# Patient Record
Sex: Male | Born: 1980
Health system: Southern US, Community
[De-identification: ages and names within clinical notes are randomized; demographics above are authoritative.]

## PROBLEM LIST (undated history)

## (undated) DIAGNOSIS — R002 Palpitations: Secondary | ICD-10-CM

## (undated) DIAGNOSIS — I10 Essential (primary) hypertension: Secondary | ICD-10-CM

## (undated) DIAGNOSIS — E7800 Pure hypercholesterolemia, unspecified: Secondary | ICD-10-CM

## (undated) DIAGNOSIS — E079 Disorder of thyroid, unspecified: Secondary | ICD-10-CM

## (undated) DIAGNOSIS — M1990 Unspecified osteoarthritis, unspecified site: Secondary | ICD-10-CM

## (undated) DIAGNOSIS — E119 Type 2 diabetes mellitus without complications: Secondary | ICD-10-CM

## (undated) DIAGNOSIS — E669 Obesity, unspecified: Secondary | ICD-10-CM

## (undated) HISTORY — DX: Type 2 diabetes mellitus without complications: E11.9

## (undated) HISTORY — PX: TONSILLECTOMY AND ADENOIDECTOMY: SHX28

## (undated) HISTORY — DX: Unspecified osteoarthritis, unspecified site: M19.90

## (undated) HISTORY — DX: Palpitations: R00.2

## (undated) HISTORY — PX: ELEVATION OF DEPRESSED SKULL FRACTURE: SUR431

---

## 2003-11-17 ENCOUNTER — Emergency Department (HOSPITAL_COMMUNITY): Admission: EM | Admit: 2003-11-17 | Discharge: 2003-11-17 | Payer: Self-pay | Admitting: *Deleted

## 2004-08-26 ENCOUNTER — Emergency Department (HOSPITAL_COMMUNITY): Admission: EM | Admit: 2004-08-26 | Discharge: 2004-08-26 | Payer: Self-pay | Admitting: Family Medicine

## 2005-01-25 ENCOUNTER — Emergency Department (HOSPITAL_COMMUNITY): Admission: EM | Admit: 2005-01-25 | Discharge: 2005-01-25 | Payer: Self-pay | Admitting: Emergency Medicine

## 2005-05-24 ENCOUNTER — Emergency Department (HOSPITAL_COMMUNITY): Admission: EM | Admit: 2005-05-24 | Discharge: 2005-05-24 | Payer: Self-pay | Admitting: Family Medicine

## 2005-07-12 ENCOUNTER — Emergency Department (HOSPITAL_COMMUNITY): Admission: EM | Admit: 2005-07-12 | Discharge: 2005-07-12 | Payer: Self-pay | Admitting: Emergency Medicine

## 2005-08-06 ENCOUNTER — Emergency Department (HOSPITAL_COMMUNITY): Admission: EM | Admit: 2005-08-06 | Discharge: 2005-08-06 | Payer: Self-pay | Admitting: Emergency Medicine

## 2005-08-16 ENCOUNTER — Emergency Department (HOSPITAL_COMMUNITY): Admission: EM | Admit: 2005-08-16 | Discharge: 2005-08-16 | Payer: Self-pay | Admitting: Emergency Medicine

## 2005-10-10 ENCOUNTER — Emergency Department (HOSPITAL_COMMUNITY): Admission: EM | Admit: 2005-10-10 | Discharge: 2005-10-10 | Payer: Self-pay | Admitting: Emergency Medicine

## 2006-08-01 ENCOUNTER — Emergency Department (HOSPITAL_COMMUNITY): Admission: EM | Admit: 2006-08-01 | Discharge: 2006-08-01 | Payer: Self-pay | Admitting: Family Medicine

## 2006-10-27 ENCOUNTER — Emergency Department (HOSPITAL_COMMUNITY): Admission: EM | Admit: 2006-10-27 | Discharge: 2006-10-27 | Payer: Self-pay | Admitting: Emergency Medicine

## 2007-01-05 ENCOUNTER — Ambulatory Visit: Payer: Self-pay | Admitting: Family Medicine

## 2007-01-05 LAB — CONVERTED CEMR LAB
ALT: 81 units/L — ABNORMAL HIGH (ref 0–53)
AST: 28 units/L (ref 0–37)
Albumin: 4.5 g/dL (ref 3.5–5.2)
Alkaline Phosphatase: 78 units/L (ref 39–117)
BUN: 12 mg/dL (ref 6–23)
Basophils Absolute: 0 10*3/uL (ref 0.0–0.1)
Basophils Relative: 1 % (ref 0–1)
CO2: 24 meq/L (ref 19–32)
Calcium: 10 mg/dL (ref 8.4–10.5)
Chloride: 102 meq/L (ref 96–112)
Cholesterol: 185 mg/dL (ref 0–200)
Creatinine, Ser: 1.08 mg/dL (ref 0.40–1.50)
Eosinophils Absolute: 0.2 10*3/uL (ref 0.0–0.7)
Eosinophils Relative: 3 % (ref 0–5)
Free T4: 0.96 ng/dL (ref 0.89–1.80)
Glucose, Bld: 93 mg/dL (ref 70–99)
HCT: 45.3 % (ref 39.0–52.0)
HDL: 45 mg/dL (ref >39)
Hemoglobin: 14.7 g/dL (ref 13.0–17.0)
LDL Cholesterol: 110 mg/dL — ABNORMAL HIGH (ref 0–99)
Lymphocytes Relative: 27 % (ref 12–46)
Lymphs Abs: 1.9 10*3/uL (ref 0.7–3.3)
MCHC: 32.5 g/dL (ref 30.0–36.0)
MCV: 98.1 fL (ref 78.0–100.0)
Monocytes Absolute: 0.7 10*3/uL (ref 0.2–0.7)
Monocytes Relative: 10 % (ref 3–11)
Neutro Abs: 4.3 10*3/uL (ref 1.7–7.7)
Neutrophils Relative %: 60 % (ref 43–77)
Platelets: 334 10*3/uL (ref 150–400)
Potassium: 4.1 meq/L (ref 3.5–5.3)
RBC: 4.62 M/uL (ref 4.22–5.81)
RDW: 13.4 % (ref 11.5–14.0)
Sodium: 141 meq/L (ref 135–145)
TSH: 33.159 microintl units/mL — ABNORMAL HIGH (ref 0.350–5.50)
Total Bilirubin: 0.8 mg/dL (ref 0.3–1.2)
Total CHOL/HDL Ratio: 4.1
Total Protein: 8.2 g/dL (ref 6.0–8.3)
Triglycerides: 150 mg/dL — ABNORMAL HIGH (ref <150)
VLDL: 30 mg/dL (ref 0–40)
WBC: 7.1 10*3/uL (ref 4.0–10.5)

## 2007-06-04 ENCOUNTER — Ambulatory Visit: Payer: Self-pay | Admitting: Family Medicine

## 2007-06-08 ENCOUNTER — Ambulatory Visit: Payer: Self-pay | Admitting: *Deleted

## 2007-07-17 ENCOUNTER — Ambulatory Visit: Payer: Self-pay | Admitting: Family Medicine

## 2007-07-17 LAB — CONVERTED CEMR LAB
Free T4: 1.05 ng/dL (ref 0.89–1.80)
TSH: 11.22 microintl units/mL — ABNORMAL HIGH (ref 0.350–5.50)

## 2007-10-17 ENCOUNTER — Emergency Department (HOSPITAL_COMMUNITY): Admission: EM | Admit: 2007-10-17 | Discharge: 2007-10-17 | Payer: Self-pay | Admitting: Emergency Medicine

## 2008-04-26 ENCOUNTER — Emergency Department (HOSPITAL_COMMUNITY): Admission: EM | Admit: 2008-04-26 | Discharge: 2008-04-26 | Payer: Self-pay | Admitting: Emergency Medicine

## 2008-05-13 ENCOUNTER — Emergency Department (HOSPITAL_COMMUNITY): Admission: EM | Admit: 2008-05-13 | Discharge: 2008-05-13 | Payer: Self-pay | Admitting: Emergency Medicine

## 2008-08-14 ENCOUNTER — Ambulatory Visit: Payer: Self-pay | Admitting: Family Medicine

## 2008-09-25 ENCOUNTER — Emergency Department (HOSPITAL_COMMUNITY): Admission: EM | Admit: 2008-09-25 | Discharge: 2008-09-25 | Payer: Self-pay | Admitting: Emergency Medicine

## 2008-10-20 ENCOUNTER — Ambulatory Visit: Payer: Self-pay | Admitting: Family Medicine

## 2008-10-20 LAB — CONVERTED CEMR LAB
BUN: 10 mg/dL (ref 6–23)
CO2: 23 meq/L (ref 19–32)
Calcium: 9.7 mg/dL (ref 8.4–10.5)
Chloride: 104 meq/L (ref 96–112)
Creatinine, Ser: 1.04 mg/dL (ref 0.40–1.50)
Free T4: 1.18 ng/dL (ref 0.80–1.80)
Glucose, Bld: 66 mg/dL — ABNORMAL LOW (ref 70–99)
Potassium: 4.3 meq/L (ref 3.5–5.3)
Sodium: 141 meq/L (ref 135–145)
TSH: 1.939 microintl units/mL (ref 0.350–4.500)
Vit D, 25-Hydroxy: 7 ng/mL — ABNORMAL LOW (ref 30–89)

## 2008-10-24 ENCOUNTER — Ambulatory Visit: Payer: Self-pay | Admitting: Family Medicine

## 2008-10-24 LAB — CONVERTED CEMR LAB: Testosterone: 194.47 ng/dL — ABNORMAL LOW (ref 350–890)

## 2008-11-13 ENCOUNTER — Ambulatory Visit: Payer: Self-pay | Admitting: Family Medicine

## 2009-02-12 ENCOUNTER — Ambulatory Visit: Payer: Self-pay | Admitting: Family Medicine

## 2009-02-12 LAB — CONVERTED CEMR LAB
Cholesterol: 168 mg/dL (ref 0–200)
HDL: 40 mg/dL (ref >39)
LDL Cholesterol: 102 mg/dL — ABNORMAL HIGH (ref 0–99)
Testosterone: 190.79 ng/dL — ABNORMAL LOW (ref 350–890)
Total CHOL/HDL Ratio: 4.2
Triglycerides: 130 mg/dL (ref <150)
VLDL: 26 mg/dL (ref 0–40)
Vit D, 25-Hydroxy: 24 ng/mL — ABNORMAL LOW (ref 30–89)

## 2009-02-19 ENCOUNTER — Ambulatory Visit: Payer: Self-pay | Admitting: Family Medicine

## 2009-03-27 ENCOUNTER — Ambulatory Visit: Payer: Self-pay | Admitting: Internal Medicine

## 2009-04-24 ENCOUNTER — Ambulatory Visit: Payer: Self-pay | Admitting: Internal Medicine

## 2009-06-16 ENCOUNTER — Ambulatory Visit: Payer: Self-pay | Admitting: Family Medicine

## 2009-06-16 LAB — CONVERTED CEMR LAB
Cholesterol: 160 mg/dL (ref 0–200)
Free T4: 1.29 ng/dL (ref 0.80–1.80)
HDL: 43 mg/dL (ref >39)
LDL Cholesterol: 94 mg/dL (ref 0–99)
TSH: 7.189 microintl units/mL — ABNORMAL HIGH (ref 0.350–4.500)
Total CHOL/HDL Ratio: 3.7
Triglycerides: 117 mg/dL (ref <150)
VLDL: 23 mg/dL (ref 0–40)
Vit D, 25-Hydroxy: 28 ng/mL — ABNORMAL LOW (ref 30–89)

## 2009-07-16 ENCOUNTER — Ambulatory Visit: Payer: Self-pay | Admitting: Family Medicine

## 2009-07-16 LAB — CONVERTED CEMR LAB: Testosterone: 141.07 ng/dL — ABNORMAL LOW (ref 350–890)

## 2010-06-13 LAB — URINALYSIS, ROUTINE W REFLEX MICROSCOPIC
Bilirubin Urine: NEGATIVE
Glucose, UA: NEGATIVE mg/dL
Hgb urine dipstick: NEGATIVE
Ketones, ur: NEGATIVE mg/dL
Nitrite: NEGATIVE
Protein, ur: NEGATIVE mg/dL
Specific Gravity, Urine: 1.028 (ref 1.005–1.030)
Urobilinogen, UA: 1 mg/dL (ref 0.0–1.0)
pH: 5.5 (ref 5.0–8.0)

## 2010-06-13 LAB — COMPREHENSIVE METABOLIC PANEL
ALT: 69 U/L — ABNORMAL HIGH (ref 0–53)
AST: 115 U/L — ABNORMAL HIGH (ref 0–37)
Albumin: 3.3 g/dL — ABNORMAL LOW (ref 3.5–5.2)
Alkaline Phosphatase: 84 U/L (ref 39–117)
BUN: 9 mg/dL (ref 6–23)
CO2: 27 mEq/L (ref 19–32)
Calcium: 9.3 mg/dL (ref 8.4–10.5)
Chloride: 102 mEq/L (ref 96–112)
Creatinine, Ser: 1.09 mg/dL (ref 0.4–1.5)
GFR calc Af Amer: >60 mL/min (ref >60)
GFR calc non Af Amer: >60 mL/min (ref >60)
Glucose, Bld: 112 mg/dL — ABNORMAL HIGH (ref 70–99)
Potassium: 3.9 mEq/L (ref 3.5–5.1)
Sodium: 139 mEq/L (ref 135–145)
Total Bilirubin: 0.9 mg/dL (ref 0.3–1.2)
Total Protein: 7.2 g/dL (ref 6.0–8.3)

## 2010-06-13 LAB — URINE CULTURE
Colony Count: NO GROWTH
Culture: NO GROWTH

## 2010-06-13 LAB — LIPASE, BLOOD: Lipase: 20 U/L (ref 11–59)

## 2010-08-01 ENCOUNTER — Ambulatory Visit (HOSPITAL_BASED_OUTPATIENT_CLINIC_OR_DEPARTMENT_OTHER): Payer: Self-pay | Attending: Internal Medicine

## 2010-08-01 DIAGNOSIS — G4733 Obstructive sleep apnea (adult) (pediatric): Secondary | ICD-10-CM | POA: Insufficient documentation

## 2010-08-07 DIAGNOSIS — G4710 Hypersomnia, unspecified: Secondary | ICD-10-CM

## 2010-08-07 DIAGNOSIS — G4730 Sleep apnea, unspecified: Secondary | ICD-10-CM

## 2010-08-07 NOTE — Procedures (Signed)
NAME:  Austin Beasley, [place]              ACCOUNT NO.:  [id number]  MEDICAL RECORD NO.:  [id number]          PATIENT TYPE:  OUT  LOCATION:  SLEEP CENTER                 FACILITY:  [facility]  PHYSICIAN:  Clinton D. Maple Hudson, MD, FCCP, FACPDATE OF BIRTH:  1979/06/23  DATE OF STUDY:  08/01/2010                           NOCTURNAL POLYSOMNOGRAM  REFERRING PHYSICIAN:  Maurice March, M.D.  INDICATION FOR STUDY:  Hypersomnia with sleep apnea.  EPWORTH SLEEPINESS SCORE:  Epworth sleepiness score 18/24, BMI 53, weight 390 pounds, height 72 inches.  Neck 20 inches.  Home medications charted and reviewed.  MEDICATIONS:  SLEEP ARCHITECTURE:  Total sleep time 369.5 minutes with sleep efficiency 89.4%.  Stage I was 6.7%, stage II 79.9%, stage III absent, REM 13.5% of total sleep time.  Sleep latency 24 minutes, REM latency 81.5 minutes, awake after sleep onset 16.5 minutes, arousal index 5.4. Bedtime medication:  None.  RESPIRATORY DATA:  Apnea/hypopnea index (AHI) 11.4 per hour.  A total of 63 events were scored all of which were hypopneas.  Events were seen in all sleep positions, especially while supine.  REM AHI 70.1 per hour. There were insufficient early events to permit application of CPAP titration by split protocol on this study night.  OXYGEN DATA:  Moderate-to-loud snoring with oxygen desaturation to a nadir of 77% and a mean oxygen saturation through the study of 93.6% on room air through the study.  CARDIAC DATA:  Normal sinus rhythm.  MOVEMENT-PARASOMNIA:  No significant movement disturbance.  Bathroom x1.  IMPRESSIONS-RECOMMENDATIONS: 1. Mild obstructive sleep apnea/hypopnea syndrome, AHI 11.4 per hour.     Most events occurred during REM sleep and were more common while     sleeping supine.  REM AHI 70.1 per hour.  Moderate-to-loud snoring     with oxygen desaturation to a nadir of 77% and mean oxygen     saturation through the study of 93.6% on room air. 2. Mild events  associated mainly with REM sleep might respond to     weight loss and encouragement to sleep off     flat with back with possible consideration of a chin strap for     snoring.  If these are not sufficient, then consider return for     CPAP titration as a dedicated study night.     Clinton D. Maple Hudson, MD, [facility], FACP Diplomate, Biomedical engineer of Sleep Medicine Electronically Signed    CDY/MEDQ  D:  08/07/2010 09:12:12  T:  08/07/2010 10:27:00  Job:  [id number]

## 2010-10-24 ENCOUNTER — Encounter (HOSPITAL_BASED_OUTPATIENT_CLINIC_OR_DEPARTMENT_OTHER): Payer: Self-pay

## 2010-11-14 ENCOUNTER — Ambulatory Visit (HOSPITAL_BASED_OUTPATIENT_CLINIC_OR_DEPARTMENT_OTHER): Payer: Self-pay

## 2011-08-14 ENCOUNTER — Ambulatory Visit (HOSPITAL_BASED_OUTPATIENT_CLINIC_OR_DEPARTMENT_OTHER): Payer: Self-pay

## 2013-10-18 ENCOUNTER — Emergency Department (HOSPITAL_COMMUNITY): Payer: Self-pay

## 2013-10-18 ENCOUNTER — Encounter (HOSPITAL_COMMUNITY): Payer: Self-pay | Admitting: Emergency Medicine

## 2013-10-18 ENCOUNTER — Emergency Department (HOSPITAL_COMMUNITY)
Admission: EM | Admit: 2013-10-18 | Discharge: 2013-10-18 | Disposition: A | Discharge status: Home / Self Care | Payer: Self-pay | Attending: Emergency Medicine | Admitting: Emergency Medicine

## 2013-10-18 DIAGNOSIS — E079 Disorder of thyroid, unspecified: Secondary | ICD-10-CM | POA: Insufficient documentation

## 2013-10-18 DIAGNOSIS — I10 Essential (primary) hypertension: Secondary | ICD-10-CM | POA: Insufficient documentation

## 2013-10-18 DIAGNOSIS — L02214 Cutaneous abscess of groin: Secondary | ICD-10-CM

## 2013-10-18 DIAGNOSIS — Z79899 Other long term (current) drug therapy: Secondary | ICD-10-CM | POA: Insufficient documentation

## 2013-10-18 DIAGNOSIS — N453 Epididymo-orchitis: Secondary | ICD-10-CM | POA: Insufficient documentation

## 2013-10-18 DIAGNOSIS — F17200 Nicotine dependence, unspecified, uncomplicated: Secondary | ICD-10-CM | POA: Insufficient documentation

## 2013-10-18 DIAGNOSIS — N5089 Other specified disorders of the male genital organs: Secondary | ICD-10-CM | POA: Insufficient documentation

## 2013-10-18 DIAGNOSIS — E669 Obesity, unspecified: Secondary | ICD-10-CM | POA: Insufficient documentation

## 2013-10-18 HISTORY — DX: Essential (primary) hypertension: I10

## 2013-10-18 HISTORY — DX: Obesity, unspecified: E66.9

## 2013-10-18 HISTORY — DX: Disorder of thyroid, unspecified: E07.9

## 2013-10-18 LAB — CBC WITH DIFFERENTIAL/PLATELET
Basophils Absolute: 0 10*3/uL (ref 0.0–0.1)
Basophils Relative: 0 % (ref 0–1)
Eosinophils Absolute: 0.3 10*3/uL (ref 0.0–0.7)
Eosinophils Relative: 3 % (ref 0–5)
HCT: 38.4 % — ABNORMAL LOW (ref 39.0–52.0)
Hemoglobin: 13 g/dL (ref 13.0–17.0)
Lymphocytes Relative: 23 % (ref 12–46)
Lymphs Abs: 2.4 10*3/uL (ref 0.7–4.0)
MCH: 31.4 pg (ref 26.0–34.0)
MCHC: 33.9 g/dL (ref 30.0–36.0)
MCV: 92.8 fL (ref 78.0–100.0)
Monocytes Absolute: 0.7 10*3/uL (ref 0.1–1.0)
Monocytes Relative: 7 % (ref 3–12)
Neutro Abs: 6.8 10*3/uL (ref 1.7–7.7)
Neutrophils Relative %: 67 % (ref 43–77)
Platelets: 330 10*3/uL (ref 150–400)
RBC: 4.14 MIL/uL — ABNORMAL LOW (ref 4.22–5.81)
RDW: 12.8 % (ref 11.5–15.5)
WBC: 10.3 10*3/uL (ref 4.0–10.5)

## 2013-10-18 LAB — BASIC METABOLIC PANEL
Anion gap: 10 (ref 5–15)
BUN: 12 mg/dL (ref 6–23)
CO2: 27 mEq/L (ref 19–32)
Calcium: 9.9 mg/dL (ref 8.4–10.5)
Chloride: 101 mEq/L (ref 96–112)
Creatinine, Ser: 1.05 mg/dL (ref 0.50–1.35)
GFR calc Af Amer: >90 mL/min (ref >90)
GFR calc non Af Amer: >90 mL/min (ref >90)
Glucose, Bld: 101 mg/dL — ABNORMAL HIGH (ref 70–99)
Potassium: 4.4 mEq/L (ref 3.7–5.3)
Sodium: 138 mEq/L (ref 137–147)

## 2013-10-18 LAB — CBG MONITORING, ED: Glucose-Capillary: 95 mg/dL (ref 70–99)

## 2013-10-18 MED ORDER — MORPHINE SULFATE 4 MG/ML IJ SOLN
4.0000 mg | Freq: Once | INTRAMUSCULAR | Status: AC
  Administered 2013-10-18: 4 mg via INTRAVENOUS
  Filled 2013-10-18: qty 1

## 2013-10-18 MED ORDER — CEPHALEXIN 500 MG PO CAPS: 500.0000 mg | ORAL_CAPSULE | Freq: Four times a day (QID) | ORAL | Status: DC

## 2013-10-18 MED ORDER — TRAMADOL HCL 50 MG PO TABS: 50.0000 mg | ORAL_TABLET | Freq: Four times a day (QID) | ORAL | Status: DC | PRN

## 2013-10-18 MED ORDER — LIDOCAINE HCL 2 % IJ SOLN
INTRAMUSCULAR | Status: AC
  Filled 2013-10-18: qty 20

## 2013-10-18 MED ORDER — SULFAMETHOXAZOLE-TRIMETHOPRIM 800-160 MG PO TABS: 1.0000 | ORAL_TABLET | Freq: Two times a day (BID) | ORAL | Status: DC

## 2013-10-18 MED ORDER — LIDOCAINE HCL (PF) 1 % IJ SOLN
5.0000 mL | Freq: Once | INTRAMUSCULAR | Status: AC
  Administered 2013-10-18: 5 mL
  Filled 2013-10-18: qty 5

## 2013-10-18 MED ORDER — ONDANSETRON HCL 4 MG/2ML IJ SOLN
4.0000 mg | Freq: Once | INTRAMUSCULAR | Status: AC
  Administered 2013-10-18: 4 mg via INTRAVENOUS
  Filled 2013-10-18: qty 2

## 2013-10-18 NOTE — ED Notes (Signed)
Patient given gauze and iodoform dressing but advised to follow up with PCP regarding iodoform. AVS explained explicitly. Patient is not driving home. No other questions/concerns. Work note given.

## 2013-10-18 NOTE — Discharge Instructions (Signed)
Incision and Drainage Incision and drainage is a procedure in which a sac-like structure (cystic structure) is opened and drained. The area to be drained usually contains material such as pus, fluid, or blood.  LET YOUR CAREGIVER KNOW ABOUT:   Allergies to medicine.  Medicines taken, including vitamins, herbs, eyedrops, over-the-counter medicines, and creams.  Use of steroids (by mouth or creams).  Previous problems with anesthetics or numbing medicines.  History of bleeding problems or blood clots.  Previous surgery.  Other health problems, including diabetes and kidney problems.  Possibility of pregnancy, if this applies. RISKS AND COMPLICATIONS  Pain.  Bleeding.  Scarring.  Infection. BEFORE THE PROCEDURE  You may need to have an ultrasound or other imaging tests to see how large or deep your cystic structure is. Blood tests may also be used to determine if you have an infection or how severe the infection is. You may need to have a tetanus shot. PROCEDURE  The affected area is cleaned with a cleaning fluid. The cyst area will then be numbed with a medicine (local anesthetic). A small incision will be made in the cystic structure. A syringe or catheter may be used to drain the contents of the cystic structure, or the contents may be squeezed out. The area will then be flushed with a cleansing solution. After cleansing the area, it is often gently packed with a gauze or another wound dressing. Once it is packed, it will be covered with gauze and tape or some other type of wound dressing. AFTER THE PROCEDURE   Often, you will be allowed to go home right after the procedure.  You may be given antibiotic medicine to prevent or heal an infection.  If the area was packed with gauze or some other wound dressing, you will likely need to come back in 1 to 2 days to get it removed.  The area should heal in about 14 days. Document Released: 08/17/2000 Document Revised: 08/23/2011  Document Reviewed: 04/18/2011 [facility] Patient Information 2015 [place], [place]. This information is not intended to replace advice given to you by your health care provider. Make sure you discuss any questions you have with your health care provider.  Abscess Care After An abscess (also called a boil or furuncle) is an infected area that contains a collection of pus. Signs and symptoms of an abscess include pain, tenderness, redness, or hardness, or you may feel a moveable soft area under your skin. An abscess can occur anywhere in the body. The infection may spread to surrounding tissues causing cellulitis. A cut (incision) by the surgeon was made over your abscess and the pus was drained out. Gauze may have been packed into the space to provide a drain that will allow the cavity to heal from the inside outwards. The boil may be painful for 5 to 7 days. Most people with a boil do not have high fevers. Your abscess, if seen early, may not have localized, and may not have been lanced. If not, another appointment may be required for this if it does not get better on its own or with medications. HOME CARE INSTRUCTIONS   Only take over-the-counter or prescription medicines for pain, discomfort, or fever as directed by your caregiver.  When you bathe, soak and then remove gauze or iodoform packs at least daily or as directed by your caregiver. You may then wash the wound gently with mild soapy water. Repack with gauze or do as your caregiver directs. SEEK IMMEDIATE MEDICAL CARE IF:  You develop increased pain, swelling, redness, drainage, or bleeding in the wound site.  You develop signs of generalized infection including muscle aches, chills, fever, or a general ill feeling.  An oral temperature above 102 F (38.9 C) develops, not controlled by medication. See your caregiver for a recheck if you develop any of the symptoms described above. If medications (antibiotics) were prescribed, take them as  directed. Document Released: 09/09/2004 Document Revised: 05/16/2011 Document Reviewed: 05/07/2007 [facility] Patient Information 2015 [place]. This information is not intended to replace advice given to you by your health care provider. Make sure you discuss any questions you have with your health care provider.  Cellulitis Cellulitis is an infection of the skin and the tissue beneath it. The infected area is usually red and tender. Cellulitis occurs most often in the arms and lower legs.  CAUSES  Cellulitis is caused by bacteria that enter the skin through cracks or cuts in the skin. The most common types of bacteria that cause cellulitis are staphylococci and streptococci. SIGNS AND SYMPTOMS   Redness and warmth.  Swelling.  Tenderness or pain.  Fever. DIAGNOSIS  Your health care provider can usually determine what is wrong based on a physical exam. Blood tests may also be done. TREATMENT  Treatment usually involves taking an antibiotic medicine. HOME CARE INSTRUCTIONS   Take your antibiotic medicine as directed by your health care provider. Finish the antibiotic even if you start to feel better.  Keep the infected arm or leg elevated to reduce swelling.  Apply a warm cloth to the affected area up to 4 times per day to relieve pain.  Take medicines only as directed by your health care provider.  Keep all follow-up visits as directed by your health care provider. SEEK MEDICAL CARE IF:   You notice red streaks coming from the infected area.  Your red area gets larger or turns dark in color.  Your bone or joint underneath the infected area becomes painful after the skin has healed.  Your infection returns in the same area or another area.  You notice a swollen bump in the infected area.  You develop new symptoms.  You have a fever. SEEK IMMEDIATE MEDICAL CARE IF:   You feel very sleepy.  You develop vomiting or diarrhea.  You have a general ill feeling  (malaise) with muscle aches and pains. MAKE SURE YOU:   Understand these instructions.  Will watch your condition.  Will get help right away if you are not doing well or get worse. Document Released: 12/01/2004 Document Revised: 07/08/2013 Document Reviewed: 05/09/2011 [facility] Patient Information 2015 [place], [place]. This information is not intended to replace advice given to you by your health care provider. Make sure you discuss any questions you have with your health care provider.

## 2013-10-18 NOTE — ED Provider Notes (Signed)
Medical screening examination/treatment/procedure(s) were performed by non-physician practitioner and as supervising physician I was immediately available for consultation/collaboration.   EKG Interpretation None        Richardean Canal, MD 10/18/13 [phone]

## 2013-10-18 NOTE — ED Notes (Signed)
PA at bedside.

## 2013-10-18 NOTE — ED Notes (Signed)
Ultrasound bedside.

## 2013-10-18 NOTE — ED Provider Notes (Signed)
CSN: [id number]     Arrival date & time 10/18/13  1031 History   First MD Initiated Contact with Patient 10/18/13 1110     Chief Complaint  Patient presents with  . Groin Swelling    1 week hx of increased pain in scrotum     (Consider location/radiation/quality/duration/timing/severity/associated sxs/prior Treatment) HPI  Patient to the ED with complaints of left testicular pain. He reports that a boil popped up on the left testicle 1 week ago and has been continually getting bigger. In the past he has had these and after 1-2 days they start to drain on there own. He is not a diabetic. He has not been feeling sick and is without nausea, vomiting, diarrhea, fatigue, fevers, abdominal pains, rectal pain, dysuria, pus from rectum or any other associated symptoms.  Past Medical History  Diagnosis Date  . Hypertension   . Thyroid disease   . Obesity    Past Surgical History  Procedure Laterality Date  . Elevation of depressed skull fracture      age 56   Family History  Problem Relation Age of Onset  . Diabetes Mother   . Cancer Mother   . Hypertension Father    History  Substance Use Topics  . Smoking status: Current Every Day Smoker    Types: Cigarettes  . Smokeless tobacco: Not on file  . Alcohol Use: Yes    Review of Systems   Review of Systems  Gen: no weight loss, fevers, chills, night sweats  Eyes: no occular draining, occular pain,  No visual changes  Nose: no epistaxis or rhinorrhea  Mouth: no dental pain, no sore throat  Neck: no neck pain  Lungs: No hemoptysis. No wheezing or coughing CV:  No palpitations, dependent edema or orthopnea. No chest pain Abd: no diarrhea. No nausea or vomiting, No abdominal pain  GU: no dysuria or gross hematuria + abscess to left testicle MSK:  No muscle weakness, No muscular pain Neuro: no headache, no focal neurologic deficits  Skin: no rash , no wounds Psyche: no complaints of depression or anxiety    Allergies   Review of patient's allergies indicates no known allergies.  Home Medications   Prior to Admission medications   Medication Sig Start Date End Date Taking? Authorizing Provider  atenolol (TENORMIN) 50 MG tablet Take 50 mg by mouth daily.   Yes Historical Provider, MD  levothyroxine (SYNTHROID, LEVOTHROID) 200 MCG tablet Take 200 mcg by mouth daily before breakfast.   Yes Historical Provider, MD  ranitidine (ZANTAC) 150 MG tablet Take 150 mg by mouth 2 (two) times daily.   Yes Historical Provider, MD   BP 117/70  Pulse 63  Temp(Src) 98.4 F (36.9 C) (Oral)  Resp 16  Wt 388 lb (175.996 kg)  SpO2 100% Physical Exam  Nursing note and vitals reviewed. Constitutional: He appears well-developed and well-nourished. No distress.  HENT:  Head: Normocephalic and atraumatic.  Eyes: Pupils are equal, round, and reactive to light.  Neck: Normal range of motion. Neck supple.  Cardiovascular: Normal rate and regular rhythm.   Pulmonary/Chest: Effort normal.  Abdominal: Soft. Bowel sounds are normal. There is no tenderness. There is no guarding, no CVA tenderness and negative Murphy's sign.  Genitourinary: Rectum normal and penis normal. Rectal exam shows no mass and no tenderness. Left testis shows swelling (large tangerine sized abscess involving the left testicle.) and tenderness.  Neurological: He is alert.  Skin: Skin is warm and dry.    ED Course  Procedures (including critical care time) Labs Review Labs Reviewed  CBC WITH DIFFERENTIAL - Abnormal; Notable for the following:    RBC 4.14 (*)    HCT 38.4 (*)    All other components within normal limits  BASIC METABOLIC PANEL - Abnormal; Notable for the following:    Glucose, Bld 101 (*)    All other components within normal limits  CBG MONITORING, ED    Imaging Review [place] Scrotum  10/18/2013   CLINICAL DATA:  33 year old male with suspected left testicle abscess. Pain and swelling of the left scrotum for 1 week. Initial encounter.   EXAM: SCROTAL ULTRASOUND  DOPPLER ULTRASOUND OF THE TESTICLES  TECHNIQUE: Complete ultrasound examination of the testicles, epididymis, and other scrotal structures was performed. Color and spectral Doppler ultrasound were also utilized to evaluate blood flow to the testicles.  COMPARISON:  None.  FINDINGS: Right testicle  Measurements: 3.0 x 2.4 x 2.1 cm. Small echogenic focus measuring 3 mm with suggestion of some shadowing probably represents an inconsequential dystrophic calcification. There is mild surrounding microlithiasis. No right testicular mass.  Left testicle  Measurements: 3.8 x 2.6 x 2.9 cm. Echotexture within normal limits. No mass or microlithiasis visualized.  Right epididymis:  Normal in size and appearance.  Left epididymis:  Normal in size and appearance.  Hydrocele:  Small simple appearing left hydrocele.  Varicocele:  None visualized.  Pulsed Doppler interrogation of both testes demonstrates low resistance arterial and venous waveforms bilaterally. However, there is evidence of asymmetric hypervascularity of the left testicle (image 78). No epididymis hypervascularity.  Other findings:  Complex superficial lesion measuring 3.7 x 1.7 x 3.4 cm. To an extent this is composed of fluid and echogenic debris. However, there do appear to be some vascular septations (images 69 and 70). This is about 4 mm below the skin surface.  IMPRESSION: 1. 3-4 cm complex collection at the area of palpable concern is superficial and extratesticular, located about 4 mm below the skin surface. Legrand RamsFavor this is an abscess, but note that it may have some vascular septations within it. 2. Normal left testicle except for hypervascularity which could be reactive to #1. Early left orchitis not excluded. 3. Negative right testicle except for some dystrophic calcification and microlithiasis.   Electronically Signed   By: Augusto GambleLee  Hall M.D.   On: 10/18/2013 13:47   [place] Art/ven Flow Abd Pelv Doppler  10/18/2013   CLINICAL DATA:   33 year old male with suspected left testicle abscess. Pain and swelling of the left scrotum for 1 week. Initial encounter.  EXAM: SCROTAL ULTRASOUND  DOPPLER ULTRASOUND OF THE TESTICLES  TECHNIQUE: Complete ultrasound examination of the testicles, epididymis, and other scrotal structures was performed. Color and spectral Doppler ultrasound were also utilized to evaluate blood flow to the testicles.  COMPARISON:  None.  FINDINGS: Right testicle  Measurements: 3.0 x 2.4 x 2.1 cm. Small echogenic focus measuring 3 mm with suggestion of some shadowing probably represents an inconsequential dystrophic calcification. There is mild surrounding microlithiasis. No right testicular mass.  Left testicle  Measurements: 3.8 x 2.6 x 2.9 cm. Echotexture within normal limits. No mass or microlithiasis visualized.  Right epididymis:  Normal in size and appearance.  Left epididymis:  Normal in size and appearance.  Hydrocele:  Small simple appearing left hydrocele.  Varicocele:  None visualized.  Pulsed Doppler interrogation of both testes demonstrates low resistance arterial and venous waveforms bilaterally. However, there is evidence of asymmetric hypervascularity of the left testicle (image 78). No epididymis hypervascularity.  Other findings:  Complex superficial lesion measuring 3.7 x 1.7 x 3.4 cm. To an extent this is composed of fluid and echogenic debris. However, there do appear to be some vascular septations (images 69 and 70). This is about 4 mm below the skin surface.  IMPRESSION: 1. 3-4 cm complex collection at the area of palpable concern is superficial and extratesticular, located about 4 mm below the skin surface. Legrand Rams this is an abscess, but note that it may have some vascular septations within it. 2. Normal left testicle except for hypervascularity which could be reactive to #1. Early left orchitis not excluded. 3. Negative right testicle except for some dystrophic calcification and microlithiasis.    Electronically Signed   By: Augusto Gamble M.D.   On: 10/18/2013 13:47     EKG Interpretation None      MDM   Final diagnoses:  Abscess of groin, left    Ultrasound shows abscess to be superficial and is extratesticular. Dr. Silverio Lay recommends I incise and drain, does not feel that this requires urologist or general surgeon. I agree and will give pain/nausea medication prior to the procedure.  INCISION AND DRAINAGE Performed by: Dorthula Matas Consent: Verbal consent obtained. Risks and benefits: risks, benefits and alternatives were discussed Type: abscess  Body area: left groin  Anesthesia: local infiltration  Incision was made with a scalpel.  Local anesthetic: lidocaine 2% wo epinephrine  Anesthetic total: 2 ml  Complexity: complex Blunt dissection to break up loculations  Drainage: purulent  Drainage amount: large  Packing material: 1/4 in iodoform gauze  Patient tolerance: Patient tolerated the procedure well with no immediate complications.   Will start on keflex and bactrim. Rx pain medications Pt to follow-up with PCP, Urologist or ED on Monday or Tuesday for follow-up  32 y.o.Maycol Hoying Wolfrey's evaluation in the Emergency Department is complete. It has been determined that no acute conditions requiring further emergency intervention are present at this time. The patient/guardian have been advised of the diagnosis and plan. We have discussed signs and symptoms that warrant return to the ED, such as changes or worsening in symptoms.  Vital signs are stable at discharge. Filed Vitals:   10/18/13 1351  BP: 117/70  Pulse: 63  Temp:   Resp: 16    Patient/guardian has voiced understanding and agreed to follow-up with the PCP or specialist.     Dorthula Matas, PA-C 10/18/13 1412

## 2013-10-18 NOTE — ED Notes (Signed)
Pt reports swelling, sharp pain  In l/scrotum x 1 week. Denies fever

## 2013-10-18 NOTE — ED Notes (Signed)
PA at bedside to drain abscess

## 2014-05-28 ENCOUNTER — Emergency Department (HOSPITAL_COMMUNITY): Payer: Self-pay

## 2014-05-28 ENCOUNTER — Emergency Department (HOSPITAL_COMMUNITY)
Admission: EM | Admit: 2014-05-28 | Discharge: 2014-05-28 | Disposition: A | Discharge status: Home / Self Care | Payer: Self-pay | Attending: Emergency Medicine | Admitting: Emergency Medicine

## 2014-05-28 ENCOUNTER — Encounter (HOSPITAL_COMMUNITY): Payer: Self-pay | Admitting: *Deleted

## 2014-05-28 DIAGNOSIS — R110 Nausea: Secondary | ICD-10-CM | POA: Insufficient documentation

## 2014-05-28 DIAGNOSIS — Z79899 Other long term (current) drug therapy: Secondary | ICD-10-CM | POA: Insufficient documentation

## 2014-05-28 DIAGNOSIS — R55 Syncope and collapse: Secondary | ICD-10-CM | POA: Insufficient documentation

## 2014-05-28 DIAGNOSIS — R0602 Shortness of breath: Secondary | ICD-10-CM | POA: Insufficient documentation

## 2014-05-28 DIAGNOSIS — E079 Disorder of thyroid, unspecified: Secondary | ICD-10-CM | POA: Insufficient documentation

## 2014-05-28 DIAGNOSIS — Z792 Long term (current) use of antibiotics: Secondary | ICD-10-CM | POA: Insufficient documentation

## 2014-05-28 DIAGNOSIS — R079 Chest pain, unspecified: Secondary | ICD-10-CM | POA: Insufficient documentation

## 2014-05-28 DIAGNOSIS — Z720 Tobacco use: Secondary | ICD-10-CM | POA: Insufficient documentation

## 2014-05-28 DIAGNOSIS — R002 Palpitations: Secondary | ICD-10-CM | POA: Insufficient documentation

## 2014-05-28 DIAGNOSIS — R42 Dizziness and giddiness: Secondary | ICD-10-CM | POA: Insufficient documentation

## 2014-05-28 DIAGNOSIS — E669 Obesity, unspecified: Secondary | ICD-10-CM | POA: Insufficient documentation

## 2014-05-28 DIAGNOSIS — R200 Anesthesia of skin: Secondary | ICD-10-CM | POA: Insufficient documentation

## 2014-05-28 DIAGNOSIS — I10 Essential (primary) hypertension: Secondary | ICD-10-CM | POA: Insufficient documentation

## 2014-05-28 LAB — BASIC METABOLIC PANEL
Anion gap: 6 (ref 5–15)
BUN: 7 mg/dL (ref 6–23)
CO2: 31 mmol/L (ref 19–32)
Calcium: 9.4 mg/dL (ref 8.4–10.5)
Chloride: 104 mmol/L (ref 96–112)
Creatinine, Ser: 1.14 mg/dL (ref 0.50–1.35)
GFR calc Af Amer: >90 mL/min (ref >90)
GFR calc non Af Amer: 83 mL/min — ABNORMAL LOW (ref >90)
Glucose, Bld: 98 mg/dL (ref 70–99)
Potassium: 3.6 mmol/L (ref 3.5–5.1)
Sodium: 141 mmol/L (ref 135–145)

## 2014-05-28 LAB — MAGNESIUM: Magnesium: 2 mg/dL (ref 1.5–2.5)

## 2014-05-28 LAB — CBC WITH DIFFERENTIAL/PLATELET
Basophils Absolute: 0 10*3/uL (ref 0.0–0.1)
Basophils Relative: 1 % (ref 0–1)
Eosinophils Absolute: 0.2 10*3/uL (ref 0.0–0.7)
Eosinophils Relative: 3 % (ref 0–5)
HCT: 38.4 % — ABNORMAL LOW (ref 39.0–52.0)
Hemoglobin: 12.9 g/dL — ABNORMAL LOW (ref 13.0–17.0)
Lymphocytes Relative: 33 % (ref 12–46)
Lymphs Abs: 2.4 10*3/uL (ref 0.7–4.0)
MCH: 31.9 pg (ref 26.0–34.0)
MCHC: 33.6 g/dL (ref 30.0–36.0)
MCV: 94.8 fL (ref 78.0–100.0)
Monocytes Absolute: 0.6 10*3/uL (ref 0.1–1.0)
Monocytes Relative: 9 % (ref 3–12)
Neutro Abs: 4.1 10*3/uL (ref 1.7–7.7)
Neutrophils Relative %: 54 % (ref 43–77)
Platelets: 317 10*3/uL (ref 150–400)
RBC: 4.05 MIL/uL — ABNORMAL LOW (ref 4.22–5.81)
RDW: 12.7 % (ref 11.5–15.5)
WBC: 7.4 10*3/uL (ref 4.0–10.5)

## 2014-05-28 LAB — PHOSPHORUS: Phosphorus: 3.8 mg/dL (ref 2.3–4.6)

## 2014-05-28 LAB — I-STAT TROPONIN, ED: Troponin i, poc: 0 ng/mL (ref 0.00–0.08)

## 2014-05-28 NOTE — ED Notes (Signed)
Pt gone to X-Ray 

## 2014-05-28 NOTE — ED Notes (Signed)
Pt undressed, in gown, on monitor, continuous pulse oximetry and blood pressure cuff 

## 2014-05-28 NOTE — Discharge Instructions (Signed)
Near-Syncope Near-syncope (commonly known as near fainting) is sudden weakness, dizziness, or feeling like you might pass out. During an episode of near-syncope, you may also develop pale skin, have tunnel vision, or feel sick to your stomach (nauseous). Near-syncope may occur when getting up after sitting or while standing for a long time. It is caused by a sudden decrease in blood flow to the brain. This decrease can result from various causes or triggers, most of which are not serious. However, because near-syncope can sometimes be a sign of something serious, a medical evaluation is required. The specific cause is often not determined. HOME CARE INSTRUCTIONS  Monitor your condition for any changes. The following actions may help to alleviate any discomfort you are experiencing:  Have someone stay with you until you feel stable.  Lie down right away and prop your feet up if you start feeling like you might faint. Breathe deeply and steadily. Wait until all the symptoms have passed. Most of these episodes last only a few minutes. You may feel tired for several hours.   Drink enough fluids to keep your urine clear or pale yellow.   If you are taking blood pressure or heart medicine, get up slowly when seated or lying down. Take several minutes to sit and then stand. This can reduce dizziness.  Follow up with your health care provider as directed. SEEK IMMEDIATE MEDICAL CARE IF:   You have a severe headache.   You have unusual pain in the chest, abdomen, or back.   You are bleeding from the mouth or rectum, or you have black or tarry stool.   You have an irregular or very fast heartbeat.   You have repeated fainting or have seizure-like jerking during an episode.   You faint when sitting or lying down.   You have confusion.   You have difficulty walking.   You have severe weakness.   You have vision problems.  MAKE SURE YOU:   Understand these instructions.  Will  watch your condition.  Will get help right away if you are not doing well or get worse. Document Released: 02/21/2005 Document Revised: 02/26/2013 Document Reviewed: 07/27/2012 [facility] Patient Information 2015 [place], [place]. This information is not intended to replace advice given to you by your health care provider. Make sure you discuss any questions you have with your health care provider.  Palpitations A palpitation is the feeling that your heartbeat is irregular or is faster than normal. It may feel like your heart is fluttering or skipping a beat. Palpitations are usually not a serious problem. However, in some cases, you may need further medical evaluation. CAUSES  Palpitations can be caused by:  Smoking.  Caffeine or other stimulants, such as diet pills or energy drinks.  Alcohol.  Stress and anxiety.  Strenuous physical activity.  Fatigue.  Certain medicines.  Heart disease, especially if you have a history of irregular heart rhythms (arrhythmias), such as atrial fibrillation, atrial flutter, or supraventricular tachycardia.  An improperly working pacemaker or defibrillator. DIAGNOSIS  To find the cause of your palpitations, your health care provider will take your medical history and perform a physical exam. Your health care provider may also have you take a test called an ambulatory electrocardiogram (ECG). An ECG records your heartbeat patterns over a 24-hour period. You may also have other tests, such as:  Transthoracic echocardiogram (TTE). During echocardiography, sound waves are used to evaluate how blood flows through your heart.  Transesophageal echocardiogram (TEE).  Cardiac monitoring.  This allows your health care provider to monitor your heart rate and rhythm in real time.  Holter monitor. This is a portable device that records your heartbeat and can help diagnose heart arrhythmias. It allows your health care provider to track your heart activity for  several days, if needed.  Stress tests by exercise or by giving medicine that makes the heart beat faster. TREATMENT  Treatment of palpitations depends on the cause of your symptoms and can vary greatly. Most cases of palpitations do not require any treatment other than time, relaxation, and monitoring your symptoms. Other causes, such as atrial fibrillation, atrial flutter, or supraventricular tachycardia, usually require further treatment. HOME CARE INSTRUCTIONS   Avoid:  Caffeinated coffee, tea, soft drinks, diet pills, and energy drinks.  Chocolate.  Alcohol.  Stop smoking if you smoke.  Reduce your stress and anxiety. Things that can help you relax include:  A method of controlling things in your body, such as your heartbeats, with your mind (biofeedback).  Yoga.  Meditation.  Physical activity such as swimming, jogging, or walking.  Get plenty of rest and sleep. SEEK MEDICAL CARE IF:   You continue to have a fast or irregular heartbeat beyond 24 hours.  Your palpitations occur more often. SEEK IMMEDIATE MEDICAL CARE IF:  You have chest pain or shortness of breath.  You have a severe headache.  You feel dizzy or you faint. MAKE SURE YOU:  Understand these instructions.  Will watch your condition.  Will get help right away if you are not doing well or get worse. Document Released: 02/19/2000 Document Revised: 02/26/2013 Document Reviewed: 04/22/2011 [facility]ExitCare Patient Information 2015 [place], [place]. This information is not intended to replace advice given to you by your health care provider. Make sure you discuss any questions you have with your health care provider.

## 2014-05-28 NOTE — ED Notes (Signed)
Pt left work and [facility] [employer]. Pt felt dizziness, weakness, and felt palpitations.  Pain radiated to left arm.  He felt as if he was going to faint.  EMS was called for further eval.  VS are as follows: BP:118/62 HR:100 Resp:18 O2sat: 99 on RA.

## 2014-05-28 NOTE — ED Provider Notes (Signed)
CSN: [id number]     Arrival date & time 05/28/14  1744 History   First MD Initiated Contact with Patient 05/28/14 1745     Chief Complaint  Patient presents with  . Chest Pain  . Palpitations  . Near Syncope     (Consider location/radiation/quality/duration/timing/severity/associated sxs/prior Treatment) Patient is a 34 y.o. male presenting with palpitations. The history is provided by the patient.  Palpitations Palpitations quality:  Fast Onset quality:  Sudden Duration:  2 minutes Timing:  Constant Progression:  Resolved Chronicity:  New Relieved by:  None tried Worsened by:  Nothing Ineffective treatments:  None tried Associated symptoms: dizziness, nausea, numbness (left arm) and shortness of breath   Associated symptoms: no chest pain, no chest pressure and no weakness   Risk factors: hx of thyroid disease   Risk factors: no diabetes mellitus and no hx of atrial fibrillation (unspecified arrhythmia)     Past Medical History  Diagnosis Date  . Hypertension   . Thyroid disease   . Obesity    Past Surgical History  Procedure Laterality Date  . Elevation of depressed skull fracture      age 34   Family History  Problem Relation Age of Onset  . Diabetes Mother   . Cancer Mother   . Hypertension Father    History  Substance Use Topics  . Smoking status: Current Every Day Smoker    Types: Cigarettes  . Smokeless tobacco: Not on file  . Alcohol Use: Yes    Review of Systems  Respiratory: Positive for shortness of breath.   Cardiovascular: Positive for palpitations. Negative for chest pain.  Gastrointestinal: Positive for nausea.  Neurological: Positive for dizziness and numbness (left arm). Negative for weakness.  All other systems reviewed and are negative.     Allergies  Review of patient's allergies indicates no known allergies.  Home Medications   Prior to Admission medications   Medication Sig Start Date End Date Taking? Authorizing Provider   atenolol (TENORMIN) 50 MG tablet Take 50 mg by mouth daily.   Yes Historical Provider, MD  levothyroxine (SYNTHROID, LEVOTHROID) 200 MCG tablet Take 200 mcg by mouth daily before breakfast.   Yes Historical Provider, MD  ranitidine (ZANTAC) 150 MG tablet Take 150 mg by mouth daily.    Yes Historical Provider, MD  cephALEXin (KEFLEX) 500 MG capsule Take 1 capsule (500 mg total) by mouth 4 (four) times daily. 10/18/13   Tiffany Neva SeatGreene, PA-C  sulfamethoxazole-trimethoprim (SEPTRA DS) 800-160 MG per tablet Take 1 tablet by mouth 2 (two) times daily. 10/18/13   Tiffany Neva SeatGreene, PA-C  traMADol (ULTRAM) 50 MG tablet Take 1 tablet (50 mg total) by mouth every 6 (six) hours as needed. 10/18/13   Tiffany Neva SeatGreene, PA-C   BP 103/59 mmHg  Pulse 59  Temp(Src) 98.2 F (36.8 C) (Oral)  Resp 12  Ht 6' (1.829 m)  Wt 385 lb (174.635 kg)  BMI 52.20 kg/m2  SpO2 95% Physical Exam  Constitutional: He is oriented to person, place, and time. He appears well-developed and well-nourished. No distress.  Pleasant, alert, cooperative  HENT:  Head: Normocephalic and atraumatic.  Mouth/Throat: Oropharynx is clear and moist. No oropharyngeal exudate.  Eyes: Conjunctivae and EOM are normal. Pupils are equal, round, and reactive to light.  Neck: Normal range of motion. Neck supple. No thyromegaly present.  Cardiovascular: Normal rate, regular rhythm, normal heart sounds and intact distal pulses.  Exam reveals no gallop and no friction rub.   No murmur heard. Pulmonary/Chest:  Effort normal and breath sounds normal. No respiratory distress. He has no wheezes. He has no rales.  Abdominal: Soft. He exhibits no distension and no mass. There is no tenderness. There is no rebound and no guarding.  Obese  Musculoskeletal: Normal range of motion. He exhibits no edema or tenderness.  Neurological: He is alert and oriented to person, place, and time. No cranial nerve deficit.  Skin: Skin is warm and dry. No rash noted. He is not  diaphoretic.  Psychiatric: He has a normal mood and affect. His behavior is normal. Judgment and thought content normal.  Nursing note and vitals reviewed.   ED Course  Procedures (including critical care time) Labs Review Labs Reviewed  CBC WITH DIFFERENTIAL/PLATELET - Abnormal; Notable for the following:    RBC 4.05 (*)    Hemoglobin 12.9 (*)    HCT 38.4 (*)    All other components within normal limits  BASIC METABOLIC PANEL - Abnormal; Notable for the following:    GFR calc non Af Amer 83 (*)    All other components within normal limits  MAGNESIUM  PHOSPHORUS  I-STAT TROPOININ, ED    Imaging Review Dg Chest 2 View  05/28/2014   CLINICAL DATA:  Chest pain al primarily on the left  EXAM: CHEST  2 VIEW  COMPARISON:  May 13, 2008  FINDINGS: Lungs are clear. Heart size and pulmonary vascularity are normal. No pneumothorax. No adenopathy. No bone lesions.  IMPRESSION: No edema or consolidation.   Electronically Signed   By: Bretta Bang III M.D.   On: 05/28/2014 19:01     EKG Interpretation   Date/Time:  Wednesday May 28 2014 17:58:05 EDT Ventricular Rate:  65 PR Interval:  158 QRS Duration: 108 QT Interval:  425 QTC Calculation: 442 R Axis:   4 Text Interpretation:  Sinus rhythm Probable left ventricular hypertrophy  Sinus rhythm Artifact No significant change since last tracing Early  repolarization pattern Abnormal ekg Confirmed by Gerhard Munch  MD  [phone]) on 05/28/2014 6:15:03 PM      MDM   Final diagnoses:  Palpitations  Near syncope   35 year old male with history of obesity, hypertension, unspecified arrhythmia presents with palpitations with presyncope. One similar episode 2 years ago. He is on atenolol but has no diagnosed arrhythmia. Today he had episode of presyncope with dizziness, palpitations, pain in left arm. Also with associated nausea and shortness of breath. This lasted for less than 2 minutes. No preceding or following chest pain. States  medical compliance and has no other complaints at this time. Normal sinus rhythm with EMS and on arrival with vital signs stable. Presentation concerning for possible arrhythmia-induced presyncope. Unclear if SVT or A. fib with RVR. No rhythm strip or heart rate to confirm. Given in frequency of symptoms, the patient should be stable for an outpatient follow-up with cardiology should laboratory workup and chest x-ray be unremarkable. Doubt ischemic etiology based on presentation and lack of symptoms.  Electrolytes reassuring and troponin negative. Chest x-ray clear. Concern for possible arrhythmia given previous history of palpitations and similar symptoms with near syncope today. Discussed with patient need for close outpatient follow-up with cardiology for possible event monitoring. Referral given. Vital signs stable throughout emergency department stay without further palpitations or headache events.  Dorna Leitz, MD 05/28/14 Jerene Bears  Gerhard Munch, MD 05/28/14 [phone]

## 2017-03-16 ENCOUNTER — Emergency Department (HOSPITAL_COMMUNITY): Payer: Self-pay

## 2017-03-16 ENCOUNTER — Emergency Department (HOSPITAL_COMMUNITY)
Admission: EM | Admit: 2017-03-16 | Discharge: 2017-03-16 | Disposition: A | Discharge status: Home / Self Care | Payer: Self-pay | Attending: Emergency Medicine | Admitting: Emergency Medicine

## 2017-03-16 ENCOUNTER — Encounter (HOSPITAL_COMMUNITY): Payer: Self-pay

## 2017-03-16 DIAGNOSIS — E079 Disorder of thyroid, unspecified: Secondary | ICD-10-CM | POA: Insufficient documentation

## 2017-03-16 DIAGNOSIS — I10 Essential (primary) hypertension: Secondary | ICD-10-CM | POA: Insufficient documentation

## 2017-03-16 DIAGNOSIS — F17210 Nicotine dependence, cigarettes, uncomplicated: Secondary | ICD-10-CM | POA: Insufficient documentation

## 2017-03-16 DIAGNOSIS — Z79899 Other long term (current) drug therapy: Secondary | ICD-10-CM | POA: Insufficient documentation

## 2017-03-16 DIAGNOSIS — R319 Hematuria, unspecified: Secondary | ICD-10-CM | POA: Insufficient documentation

## 2017-03-16 DIAGNOSIS — N390 Urinary tract infection, site not specified: Secondary | ICD-10-CM | POA: Insufficient documentation

## 2017-03-16 DIAGNOSIS — R109 Unspecified abdominal pain: Secondary | ICD-10-CM

## 2017-03-16 LAB — URINALYSIS, ROUTINE W REFLEX MICROSCOPIC: Squamous Epithelial / LPF: NONE SEEN

## 2017-03-16 LAB — CBC
HCT: 39.7 % (ref 39.0–52.0)
Hemoglobin: 13.3 g/dL (ref 13.0–17.0)
MCH: 31.7 pg (ref 26.0–34.0)
MCHC: 33.5 g/dL (ref 30.0–36.0)
MCV: 94.7 fL (ref 78.0–100.0)
Platelets: 295 10*3/uL (ref 150–400)
RBC: 4.19 MIL/uL — ABNORMAL LOW (ref 4.22–5.81)
RDW: 12.8 % (ref 11.5–15.5)
WBC: 15.3 10*3/uL — ABNORMAL HIGH (ref 4.0–10.5)

## 2017-03-16 LAB — BASIC METABOLIC PANEL
Anion gap: 9 (ref 5–15)
BUN: 10 mg/dL (ref 6–20)
CO2: 27 mmol/L (ref 22–32)
Calcium: 9.4 mg/dL (ref 8.9–10.3)
Chloride: 99 mmol/L — ABNORMAL LOW (ref 101–111)
Creatinine, Ser: 0.94 mg/dL (ref 0.61–1.24)
GFR calc Af Amer: >60 mL/min (ref >60)
GFR calc non Af Amer: >60 mL/min (ref >60)
Glucose, Bld: 249 mg/dL — ABNORMAL HIGH (ref 65–99)
Potassium: 3.9 mmol/L (ref 3.5–5.1)
Sodium: 135 mmol/L (ref 135–145)

## 2017-03-16 MED ORDER — MORPHINE SULFATE (PF) 4 MG/ML IV SOLN
4.0000 mg | Freq: Once | INTRAVENOUS | Status: DC
  Filled 2017-03-16: qty 1

## 2017-03-16 MED ORDER — LEVOFLOXACIN 750 MG PO TABS: 750.0000 mg | ORAL_TABLET | Freq: Every day | ORAL | 0 refills | Status: DC

## 2017-03-16 MED ORDER — KETOROLAC TROMETHAMINE 30 MG/ML IJ SOLN
30.0000 mg | Freq: Once | INTRAMUSCULAR | Status: AC
  Administered 2017-03-16: 30 mg via INTRAVENOUS
  Filled 2017-03-16: qty 1

## 2017-03-16 MED ORDER — LEVOFLOXACIN 750 MG PO TABS
750.0000 mg | ORAL_TABLET | Freq: Once | ORAL | Status: AC
Start: 2017-03-16 — End: 2017-03-16
  Administered 2017-03-16: 750 mg via ORAL
  Filled 2017-03-16: qty 1

## 2017-03-16 MED ORDER — HYDROCODONE-ACETAMINOPHEN 5-325 MG PO TABS: 1.0000 | ORAL_TABLET | ORAL | 0 refills | Status: DC | PRN

## 2017-03-16 NOTE — ED Triage Notes (Signed)
Per Pt, Pt is coming from home with complaints of increase urinary symptoms and flank pain for four days. Reports that he started to have blood in urine today with dysuria.

## 2017-03-16 NOTE — ED Notes (Signed)
Patient discharge by another RN

## 2017-03-16 NOTE — Discharge Instructions (Signed)
Your workup today did not show evidence of kidney stones.  It looks as if you have a urinary tract infection causing your discomfort and urinary symptoms.  Please take the antibiotics to treat your infection and you may use the pain medication to help with the discomfort.  Please follow-up with your primary care physician in the next several days.  If any symptoms department.

## 2017-03-16 NOTE — ED Provider Notes (Signed)
[facility] EMERGENCY DEPARTMENT Provider Note   CSN: [id number] Arrival date & time: 03/16/17  0908     History   Chief Complaint Chief Complaint  Patient presents with  . Flank Pain    HPI Austin Beasley is a 37 y.o. male.  The history is provided by the patient and medical records. No language interpreter was used.  Flank Pain  This is a new problem. The current episode started more than 2 days ago. The problem occurs constantly. The problem has been rapidly worsening. Associated symptoms include abdominal pain. Pertinent negatives include no chest pain, no headaches and no shortness of breath. Nothing aggravates the symptoms. Nothing relieves the symptoms. He has tried nothing for the symptoms. The treatment provided no relief.    Past Medical History:  Diagnosis Date  . Hypertension   . Obesity   . Thyroid disease     There are no active problems to display for this patient.   Past Surgical History:  Procedure Laterality Date  . ELEVATION OF DEPRESSED SKULL FRACTURE     age 39       Home Medications    Prior to Admission medications   Medication Sig Start Date End Date Taking? Authorizing Provider  atenolol (TENORMIN) 50 MG tablet Take 50 mg by mouth daily.    [provider]  cephALEXin (KEFLEX) 500 MG capsule Take 1 capsule (500 mg total) by mouth 4 (four) times daily. 10/18/13   Marlon Pel, PA-C  levothyroxine (SYNTHROID, LEVOTHROID) 200 MCG tablet Take 200 mcg by mouth daily before breakfast.    [provider]  ranitidine (ZANTAC) 150 MG tablet Take 150 mg by mouth daily.     [provider]  sulfamethoxazole-trimethoprim (SEPTRA DS) 800-160 MG per tablet Take 1 tablet by mouth 2 (two) times daily. 10/18/13   Marlon Pel, PA-C  traMADol (ULTRAM) 50 MG tablet Take 1 tablet (50 mg total) by mouth every 6 (six) hours as needed. 10/18/13   Marlon Pel, PA-C    Family History Family History  Problem  Relation Age of Onset  . Diabetes Mother   . Cancer Mother   . Hypertension Father     Social History Social History   Tobacco Use  . Smoking status: Current Every Day Smoker    Types: Cigarettes  . Smokeless tobacco: Never Used  Substance Use Topics  . Alcohol use: Yes  . Drug use: Yes    Types: Marijuana     Allergies   Patient has no known allergies.   Review of Systems Review of Systems  Constitutional: Negative for chills, diaphoresis, fatigue and fever.  HENT: Negative for congestion.   Respiratory: Negative for cough, chest tightness, shortness of breath, wheezing and stridor.   Cardiovascular: Negative for chest pain, palpitations and leg swelling.  Gastrointestinal: Positive for abdominal pain. Negative for constipation, diarrhea, nausea and vomiting.  Genitourinary: Positive for dysuria, flank pain and frequency.  Musculoskeletal: Positive for back pain. Negative for neck pain and neck stiffness.  Skin: Negative for rash and wound.  Neurological: Negative for light-headedness, numbness and headaches.  All other systems reviewed and are negative.    Physical Exam Updated Vital Signs BP (!) 163/89 (BP Location: Right Arm)   Pulse 83   Temp 98.1 F (36.7 C) (Oral)   Resp 18   Ht 6' (1.829 m)   Wt (!) 174.6 kg (385 lb)   SpO2 98%   BMI 52.22 kg/m   Physical Exam  Constitutional: He appears well-developed and well-nourished. No distress.  HENT:  Head: Normocephalic.  Nose: Nose normal.  Mouth/Throat: Oropharynx is clear and moist. No oropharyngeal exudate.  Eyes: Conjunctivae and EOM are normal. Pupils are equal, round, and reactive to light.  Neck: Normal range of motion.  Cardiovascular: Normal rate and intact distal pulses.  No murmur heard. Pulmonary/Chest: Effort normal. No stridor. No respiratory distress. He exhibits no tenderness.  Abdominal: Soft. Normal appearance and bowel sounds are normal. He exhibits no distension. There is no  tenderness. There is CVA tenderness.    Musculoskeletal: He exhibits tenderness. He exhibits no edema.       Thoracic back: He exhibits tenderness and pain.       Back:  Neurological: He is alert. No sensory deficit. He exhibits normal muscle tone.  Skin: Skin is warm. Capillary refill takes less than 2 seconds. He is not diaphoretic. No erythema. No pallor.  Nursing note and vitals reviewed.    ED Treatments / Results  Labs (all labs ordered are listed, but only abnormal results are displayed) Labs Reviewed  URINALYSIS, ROUTINE W REFLEX MICROSCOPIC - Abnormal; Notable for the following components:      Result Value   Color, Urine RED (*)    APPearance TURBID (*)    Glucose, UA   (*)    Value: TEST NOT REPORTED DUE TO COLOR INTERFERENCE OF URINE PIGMENT   Hgb urine dipstick   (*)    Value: TEST NOT REPORTED DUE TO COLOR INTERFERENCE OF URINE PIGMENT   Bilirubin Urine   (*)    Value: TEST NOT REPORTED DUE TO COLOR INTERFERENCE OF URINE PIGMENT   Ketones, ur   (*)    Value: TEST NOT REPORTED DUE TO COLOR INTERFERENCE OF URINE PIGMENT   Protein, ur   (*)    Value: TEST NOT REPORTED DUE TO COLOR INTERFERENCE OF URINE PIGMENT   Nitrite   (*)    Value: TEST NOT REPORTED DUE TO COLOR INTERFERENCE OF URINE PIGMENT   Leukocytes, UA   (*)    Value: TEST NOT REPORTED DUE TO COLOR INTERFERENCE OF URINE PIGMENT   Bacteria, UA MANY (*)    All other components within normal limits  BASIC METABOLIC PANEL - Abnormal; Notable for the following components:   Chloride 99 (*)    Glucose, Bld 249 (*)    All other components within normal limits  CBC - Abnormal; Notable for the following components:   WBC 15.3 (*)    RBC 4.19 (*)    All other components within normal limits  URINE CULTURE    EKG  EKG Interpretation None       Radiology Ct Renal Stone Study  Result Date: 03/16/2017 CLINICAL DATA:  Dysuria and hematuria with right-sided flank pain EXAM: CT ABDOMEN AND PELVIS WITHOUT  CONTRAST TECHNIQUE: Multidetector CT imaging of the abdomen and pelvis was performed following the standard protocol without oral or intravenous contrast material administration. COMPARISON:  None. FINDINGS: Lower chest: Lung bases are clear. Hepatobiliary: Liver measures 21.8 cm in length. There is hepatic steatosis. No focal liver lesions are evident on this noncontrast enhanced study. There are gallstones within the gallbladder. There is no gallbladder wall thickening. There is no biliary duct dilatation. Pancreas: No pancreatic mass or inflammatory focus. Spleen: No splenic lesions are evident. Adrenals/Urinary Tract: Adrenals bilaterally appear normal. Kidneys bilaterally show no evident mass or hydronephrosis on either side. There is no evident renal or ureteral calculus on either side.  Urinary bladder is midline with wall thickness within normal limits. Stomach/Bowel: There is no appreciable bowel wall or mesenteric thickening. No bowel obstruction. No free air or portal venous air. Vascular/Lymphatic: No abdominal aortic aneurysm. No vascular lesions are appreciable. There is no adenopathy in the abdomen or pelvis. Reproductive: Prostate and seminal vesicles are normal in size and contour. No pelvic mass evident. Other: Appendix appears normal. No abscess or ascites is evident in the abdomen or pelvis. Musculoskeletal: There is degenerative change in each hip joint. There are no blastic or lytic bone lesions. There is no intramuscular or abdominal wall lesion evident. IMPRESSION: 1. No renal or ureteral calculus evident. No hydronephrosis. No urinary bladder wall thickening. A cause for patient's symptoms has not been established with this study. 2. Prominent liver with hepatic steatosis. No focal liver lesions are appreciable on this noncontrast enhanced study. 3.  Cholelithiasis.  No gallbladder wall thickening. 4.  No abscess.  No bowel obstruction.  Appendix appears normal. Electronically Signed   By:  Bretta Bang III M.D.   On: 03/16/2017 12:01    Procedures Procedures (including critical care time)  Medications Ordered in ED Medications  morphine 4 MG/ML injection 4 mg (0 mg Intravenous Hold 03/16/17 1214)  ketorolac (TORADOL) 30 MG/ML injection 30 mg (30 mg Intravenous Given 03/16/17 1213)  levofloxacin (LEVAQUIN) tablet 750 mg (750 mg Oral Given 03/16/17 1505)     Initial Impression / Assessment and Plan / ED Course  I have reviewed the triage vital signs and the nursing notes.  Pertinent labs & imaging results that were available during my care of the patient were reviewed by me and considered in my medical decision making (see chart for details).     Austin Beasley is a 37 y.o. male with a past medical history significant for hypertension, thyroid disease, and prior urinary tract infection who presents with right flank pain, dysuria, and hematuria.  Patient denies any history of kidney stones or any family history of this.  He reports over the last 4 days, he has had an aching pain in his right flank that radiated towards his right lower quadrant.  He describes it acutely worsened today and he has had hematuria.  He reports some dysuria over the last few days as well.  He describes the flank pain as an 8 out of 10 in severity and a burning pain when he urinates.  He denies fevers but does report some chills.  He denies nausea, vomiting, or recent trauma.  He denies any constipation, diarrhea, or URI symptoms.  No abdominal pain or groin pain otherwise.  He has not taken medicine to help with symptoms.  On exam, patient has right-sided CVA tenderness and right-sided flank tenderness.  No abdominal tenderness.  Patient denied any groin complaints.  Patient's exam otherwise unremarkable.  Patient is obese but had no significant pitting edema in the legs.  Patient had urinalysis in triage showing many bacteria and too numerous to count white blood cells but cannot be checked for  nitrites and leukocytes due to turbidity.  Patient had leukocytosis of 15.3 but no anemia.  Glucose elevated at 249.  Creatinine normal.  Based on description of symptoms, I am concerned about kidney stone or pyelonephritis.  Patient will have CT imaging to look for.  He will given pain medicine during initial workup.  Anticipate reassessment after imaging.  2:50 PM CT scan showed no evidence of stone.  No hydronephrosis.  No evidence of bladder wall  thickening.  There is evidence of cholelithiasis but no evidence of cholecystitis.  Some liver steatosis present.  No bowel obstruction.  Urinalysis shows evidence of infection.  Suspect patient has pyelonephritis given the distribution of pain.  Patient will be treated with levofloxacin and then discharged with levofloxacin and pain medication.  Patient will follow up with PCP in the next several days and understood return precautions.  Patient had no other questions or concerns and was discharged in good condition.    Final Clinical Impressions(s) / ED Diagnoses   Final diagnoses:  Flank pain  Lower urinary tract infectious disease  Hematuria, unspecified type    ED Discharge Orders        Ordered    levofloxacin (LEVAQUIN) 750 MG tablet  Daily     03/16/17 1430    HYDROcodone-acetaminophen (NORCO/VICODIN) 5-325 MG tablet  Every 4 hours PRN     03/16/17 1430      Clinical Impression: 1. Flank pain   2. Lower urinary tract infectious disease   3. Hematuria, unspecified type     Disposition: Discharge  Condition: Good  I have discussed the results, Dx and Tx plan with the pt(& family if present). He/she/they expressed understanding and agree(s) with the plan. Discharge instructions discussed at great length. Strict return precautions discussed and pt &/or family have verbalized understanding of the instructions. No further questions at time of discharge.    New Prescriptions   HYDROCODONE-ACETAMINOPHEN (NORCO/VICODIN) 5-325 MG  TABLET    Take 1 tablet by mouth every 4 (four) hours as needed.   LEVOFLOXACIN (LEVAQUIN) 750 MG TABLET    Take 1 tablet (750 mg total) by mouth daily.    Follow Up: [facility] [address] [place] [address] [phone] Schedule an appointment as soon as possible for a visit    [facility] EMERGENCY DEPARTMENT [address] [id number] mc [place] [address] [phone]  If symptoms worsen     Angelize Ryce, Canary Brim, MD 03/16/17 1646

## 2017-03-19 LAB — URINE CULTURE: Culture: >=100000 — AB

## 2017-03-20 ENCOUNTER — Telehealth: Payer: Self-pay | Admitting: *Deleted

## 2017-03-20 NOTE — Telephone Encounter (Signed)
Post ED Visit - Positive Culture Follow-up  Culture report reviewed by antimicrobial stewardship pharmacist:  [x]  Enzo BiNathan Batchelder, Pharm.D. []  Celedonio MiyamotoJeremy Frens, Pharm.D., BCPS AQ-ID []  Garvin FilaMike Maccia, Pharm.D., BCPS []  Georgina PillionElizabeth Martin, Pharm.D., BCPS []  ValenciaMinh Pham, VermontPharm.D., BCPS, AAHIVP []  Estella HuskMichelle Turner, Pharm.D., BCPS, AAHIVP []  Lysle Pearlachel Rumbarger, PharmD, BCPS []  Blake DivineShannon Parkey, PharmD []  Pollyann SamplesAndy Johnston, PharmD, BCPS  Positive urine culture Treated with Levofloxacin, organism sensitive to the same and no further patient follow-up is required at this time.  Virl AxeRobertson, [facility] 03/20/2017, 9:50 AM

## 2017-04-24 ENCOUNTER — Other Ambulatory Visit: Payer: Self-pay

## 2017-04-24 ENCOUNTER — Encounter (HOSPITAL_COMMUNITY): Payer: Self-pay

## 2017-04-24 ENCOUNTER — Emergency Department (HOSPITAL_COMMUNITY)
Admission: EM | Admit: 2017-04-24 | Discharge: 2017-04-24 | Disposition: A | Discharge status: Home / Self Care | Payer: Self-pay | Attending: Emergency Medicine | Admitting: Emergency Medicine

## 2017-04-24 DIAGNOSIS — Z79899 Other long term (current) drug therapy: Secondary | ICD-10-CM | POA: Insufficient documentation

## 2017-04-24 DIAGNOSIS — I10 Essential (primary) hypertension: Secondary | ICD-10-CM | POA: Insufficient documentation

## 2017-04-24 DIAGNOSIS — N481 Balanitis: Secondary | ICD-10-CM | POA: Insufficient documentation

## 2017-04-24 DIAGNOSIS — R739 Hyperglycemia, unspecified: Secondary | ICD-10-CM | POA: Insufficient documentation

## 2017-04-24 DIAGNOSIS — F17210 Nicotine dependence, cigarettes, uncomplicated: Secondary | ICD-10-CM | POA: Insufficient documentation

## 2017-04-24 LAB — URINALYSIS, ROUTINE W REFLEX MICROSCOPIC
Bilirubin Urine: NEGATIVE
Glucose, UA: >=500 mg/dL — AB
Ketones, ur: 20 mg/dL — AB
Nitrite: NEGATIVE
Protein, ur: NEGATIVE mg/dL
Specific Gravity, Urine: 1.035 — ABNORMAL HIGH (ref 1.005–1.030)
pH: 5 (ref 5.0–8.0)

## 2017-04-24 LAB — BASIC METABOLIC PANEL
Anion gap: 14 (ref 5–15)
BUN: 10 mg/dL (ref 6–20)
CO2: 23 mmol/L (ref 22–32)
Calcium: 9.1 mg/dL (ref 8.9–10.3)
Chloride: 95 mmol/L — ABNORMAL LOW (ref 101–111)
Creatinine, Ser: 1.16 mg/dL (ref 0.61–1.24)
GFR calc Af Amer: >60 mL/min (ref >60)
GFR calc non Af Amer: >60 mL/min (ref >60)
Glucose, Bld: 560 mg/dL (ref 65–99)
Potassium: 4 mmol/L (ref 3.5–5.1)
Sodium: 132 mmol/L — ABNORMAL LOW (ref 135–145)

## 2017-04-24 LAB — CBG MONITORING, ED
Glucose-Capillary: 540 mg/dL (ref 65–99)
Glucose-Capillary: 371 mg/dL — ABNORMAL HIGH (ref 65–99)
Glucose-Capillary: 375 mg/dL — ABNORMAL HIGH (ref 65–99)
Glucose-Capillary: 341 mg/dL — ABNORMAL HIGH (ref 65–99)

## 2017-04-24 LAB — CBC
HCT: 41.1 % (ref 39.0–52.0)
Hemoglobin: 14.4 g/dL (ref 13.0–17.0)
MCH: 33.4 pg (ref 26.0–34.0)
MCHC: 35 g/dL (ref 30.0–36.0)
MCV: 95.4 fL (ref 78.0–100.0)
Platelets: 259 10*3/uL (ref 150–400)
RBC: 4.31 MIL/uL (ref 4.22–5.81)
RDW: 12.6 % (ref 11.5–15.5)
WBC: 8.5 10*3/uL (ref 4.0–10.5)

## 2017-04-24 MED ORDER — ATENOLOL 50 MG PO TABS: 50.0000 mg | ORAL_TABLET | Freq: Every day | ORAL | 0 refills | Status: DC

## 2017-04-24 MED ORDER — SODIUM CHLORIDE 0.9 % IV BOLUS (SEPSIS)
1000.0000 mL | Freq: Once | INTRAVENOUS | Status: AC
  Administered 2017-04-24: 1000 mL via INTRAVENOUS

## 2017-04-24 MED ORDER — INSULIN ASPART 100 UNIT/ML ~~LOC~~ SOLN
6.0000 [IU] | Freq: Once | SUBCUTANEOUS | Status: AC
  Administered 2017-04-24: 6 [IU] via SUBCUTANEOUS
  Filled 2017-04-24: qty 1

## 2017-04-24 MED ORDER — METFORMIN HCL 500 MG PO TABS
500.0000 mg | ORAL_TABLET | Freq: Once | ORAL | Status: AC
  Administered 2017-04-24: 500 mg via ORAL
  Filled 2017-04-24: qty 1

## 2017-04-24 MED ORDER — METFORMIN HCL 500 MG PO TABS: 500.0000 mg | ORAL_TABLET | Freq: Two times a day (BID) | ORAL | 0 refills | Status: DC

## 2017-04-24 MED ORDER — CLOTRIMAZOLE 1 % EX CREA: TOPICAL_CREAM | CUTANEOUS | 0 refills | Status: DC

## 2017-04-24 NOTE — ED Notes (Signed)
Blood sugar 560 per lab

## 2017-04-24 NOTE — ED Triage Notes (Addendum)
Pt presents to the ed with complaints of checking his blood sugar at home and it read greater than 600. Complaints of increased urination. Pt has no hx of diabetes. Pt also complains of having sores on his penis thinking he may have a yeast infection.

## 2017-04-24 NOTE — ED Notes (Signed)
ED Provider at bedside. 

## 2017-04-24 NOTE — Discharge Instructions (Signed)
Drink plenty of water, or sugar free drinks. Cut down on simple carbohydrates. See print outs attached. Metformin as prescribed. Call as instructed above to schedule appointment.

## 2017-04-24 NOTE — ED Provider Notes (Signed)
[facility] EMERGENCY DEPARTMENT Provider Note   CSN: [id number] Arrival date & time: 04/24/17  1330     History   Chief Complaint Chief Complaint  Patient presents with  . Hyperglycemia    HPI Austin Beasley is a 37 y.o. male.  HPI Austin Beasley is a 37 y.o. malewith hx of HTN, presents to ED with complaint of elevated blood sugar. Pt with polyuria, polydipsia, weakness, tingling to feet for the last week. States his mom checked his blood sugar and it was greater than 600.  Patient states that he was recently treated for UTI.  He finished all of the antibiotics and his symptoms improved.  He states he is also now having some redness, skin tears and irritation as well as white discharge around his penis.  He denies any fever or chills.  No chest pain or abdominal pain.  No nausea or vomiting.  No history of diabetes.  States she is currently does not have insurance or primary care doctor.  Is also requesting refill of his atenolol that he takes for "to control the heart rate."  Past Medical History:  Diagnosis Date  . Hypertension   . Obesity   . Thyroid disease     There are no active problems to display for this patient.   Past Surgical History:  Procedure Laterality Date  . ELEVATION OF DEPRESSED SKULL FRACTURE     age 75       Home Medications    Prior to Admission medications   Medication Sig Start Date End Date Taking? Authorizing Provider  atenolol (TENORMIN) 50 MG tablet Take 50 mg by mouth daily.    [provider]  cephALEXin (KEFLEX) 500 MG capsule Take 1 capsule (500 mg total) by mouth 4 (four) times daily. 10/18/13   Marlon Pel, PA-C  HYDROcodone-acetaminophen (NORCO/VICODIN) 5-325 MG tablet Take 1 tablet by mouth every 4 (four) hours as needed. 03/16/17   Tegeler, Canary Brim, MD  levofloxacin (LEVAQUIN) 750 MG tablet Take 1 tablet (750 mg total) by mouth daily. 03/17/17   Tegeler, Canary Brim, MD  levothyroxine  (SYNTHROID, LEVOTHROID) 200 MCG tablet Take 200 mcg by mouth daily before breakfast.    [provider]  ranitidine (ZANTAC) 150 MG tablet Take 150 mg by mouth daily.     [provider]  sulfamethoxazole-trimethoprim (SEPTRA DS) 800-160 MG per tablet Take 1 tablet by mouth 2 (two) times daily. 10/18/13   Marlon Pel, PA-C  traMADol (ULTRAM) 50 MG tablet Take 1 tablet (50 mg total) by mouth every 6 (six) hours as needed. 10/18/13   Marlon Pel, PA-C    Family History Family History  Problem Relation Age of Onset  . Diabetes Mother   . Cancer Mother   . Hypertension Father     Social History Social History   Tobacco Use  . Smoking status: Current Every Day Smoker    Types: Cigarettes  . Smokeless tobacco: Never Used  Substance Use Topics  . Alcohol use: Yes  . Drug use: Yes    Types: Marijuana     Allergies   Patient has no known allergies.   Review of Systems Review of Systems  Constitutional: Negative for chills and fever.  Respiratory: Negative for cough, chest tightness and shortness of breath.   Cardiovascular: Negative for chest pain, palpitations and leg swelling.  Gastrointestinal: Negative for abdominal distention, abdominal pain, diarrhea, nausea and vomiting.  Endocrine: Positive for polydipsia and polyuria.  Genitourinary: Positive  for penile pain and penile swelling. Negative for dysuria, frequency, hematuria and urgency.  Musculoskeletal: Negative for arthralgias, myalgias, neck pain and neck stiffness.  Skin: Negative for rash.  Allergic/Immunologic: Negative for immunocompromised state.  Neurological: Positive for weakness. Negative for dizziness, light-headedness, numbness and headaches.  All other systems reviewed and are negative.    Physical Exam Updated Vital Signs BP 124/65   Pulse 65   Temp 98.4 F (36.9 C) (Oral)   Resp 18   SpO2 97%   Physical Exam  Constitutional: He is oriented to person, place, and time. He  appears well-developed and well-nourished. No distress.  HENT:  Head: Normocephalic and atraumatic.  Eyes: Conjunctivae are normal.  Neck: Neck supple.  Cardiovascular: Normal rate, regular rhythm and normal heart sounds.  Pulmonary/Chest: Effort normal. No respiratory distress. He has no wheezes. He has no rales.  Abdominal: Soft. Bowel sounds are normal. He exhibits no distension. There is no tenderness. There is no rebound.  Musculoskeletal: He exhibits no edema.  Neurological: He is alert and oriented to person, place, and time.  Skin: Skin is warm and dry.  Nursing note and vitals reviewed.    ED Treatments / Results  Labs (all labs ordered are listed, but only abnormal results are displayed) Labs Reviewed  BASIC METABOLIC PANEL - Abnormal; Notable for the following components:      Result Value   Sodium 132 (*)    Chloride 95 (*)    Glucose, Bld 560 (*)    All other components within normal limits  URINALYSIS, ROUTINE W REFLEX MICROSCOPIC - Abnormal; Notable for the following components:   APPearance HAZY (*)    Specific Gravity, Urine 1.035 (*)    Glucose, UA >=500 (*)    Hgb urine dipstick SMALL (*)    Ketones, ur 20 (*)    Leukocytes, UA SMALL (*)    Bacteria, UA RARE (*)    Squamous Epithelial / LPF 0-5 (*)    All other components within normal limits  CBG MONITORING, ED - Abnormal; Notable for the following components:   Glucose-Capillary 540 (*)    All other components within normal limits  CBG MONITORING, ED - Abnormal; Notable for the following components:   Glucose-Capillary 371 (*)    All other components within normal limits  CBG MONITORING, ED - Abnormal; Notable for the following components:   Glucose-Capillary 375 (*)    All other components within normal limits  CBG MONITORING, ED - Abnormal; Notable for the following components:   Glucose-Capillary 341 (*)    All other components within normal limits  CBC    EKG  EKG Interpretation None         Radiology No results found.  Procedures Procedures (including critical care time)  Medications Ordered in ED Medications  sodium chloride 0.9 % bolus 1,000 mL (0 mLs Intravenous Stopped 04/24/17 2045)  sodium chloride 0.9 % bolus 1,000 mL (0 mLs Intravenous Stopped 04/24/17 2045)  metFORMIN (GLUCOPHAGE) tablet 500 mg (500 mg Oral Given 04/24/17 2044)  insulin aspart (novoLOG) injection 6 Units (6 Units Subcutaneous Given 04/24/17 2108)     Initial Impression / Assessment and Plan / ED Course  I have reviewed the triage vital signs and the nursing notes.  Pertinent labs & imaging results that were available during my care of the patient were reviewed by me and considered in my medical decision making (see chart for details).     Pt in ED with glucose of 540. No  diagnosis of DM.  Will hydrate with IV fluids. Get labs. WIll recheck cbg.   9:45 PM Recheck CBG is 371.  Insulin was ordered, will recheck blood sugar again.  Patient has no signs of DKA, normal anion gap.  Normal CBC.  Seems that his UTI has resolved.  I discussed with him dietary changes that he needs to make, as well as discussed starting to exercise for 30 minutes 5 times a week.  I will also start him on metformin.  I spoke with Burna Mortimer with case management, they will set him up an appointment with family doctor for further evaluation and treatment of his diabetes.  Return precautions discussed.  Glucose down to 341. Pt would like to go home. Will dc home with outpatient follow up. Return precautions discussed.   Vitals:   04/24/17 1500 04/24/17 1724 04/24/17 2000  BP: (!) 144/84 (!) 136/91 124/65  Pulse: 69 70 65  Resp: 18 18 18   Temp: 98.4 F (36.9 C)    TempSrc: Oral    SpO2: 100% 98% 97%    Final Clinical Impressions(s) / ED Diagnoses   Final diagnoses:  Hyperglycemia    ED Discharge Orders        Ordered    atenolol (TENORMIN) 50 MG tablet  Daily     04/24/17 2324    metFORMIN (GLUCOPHAGE) 500 MG  tablet  2 times daily with meals     04/24/17 2324       Jaynie Crumble, PA-C 04/24/17 2327    Loren Racer, MD 04/27/17 1024

## 2017-04-24 NOTE — ED Notes (Signed)
CBG- 371 

## 2017-04-24 NOTE — ED Notes (Signed)
Social work at bedside.  

## 2017-05-03 ENCOUNTER — Ambulatory Visit: Payer: Self-pay | Attending: Internal Medicine | Admitting: Physician Assistant

## 2017-05-03 VITALS — BP 121/81 | HR 65 | Temp 97.5°F | Ht 76.0 in | Wt 392.6 lb

## 2017-05-03 DIAGNOSIS — I10 Essential (primary) hypertension: Secondary | ICD-10-CM | POA: Insufficient documentation

## 2017-05-03 DIAGNOSIS — E119 Type 2 diabetes mellitus without complications: Secondary | ICD-10-CM

## 2017-05-03 DIAGNOSIS — Z79899 Other long term (current) drug therapy: Secondary | ICD-10-CM | POA: Insufficient documentation

## 2017-05-03 DIAGNOSIS — Z7984 Long term (current) use of oral hypoglycemic drugs: Secondary | ICD-10-CM | POA: Insufficient documentation

## 2017-05-03 DIAGNOSIS — E039 Hypothyroidism, unspecified: Secondary | ICD-10-CM | POA: Insufficient documentation

## 2017-05-03 LAB — POCT CBG (FASTING - GLUCOSE)-MANUAL ENTRY: Glucose Fasting, POC: 227 mg/dL — AB (ref 70–99)

## 2017-05-03 LAB — POCT GLYCOSYLATED HEMOGLOBIN (HGB A1C): Hemoglobin A1C: 13.2

## 2017-05-03 MED ORDER — GLUCOSE BLOOD VI STRP: ORAL_STRIP | 12 refills | Status: DC

## 2017-05-03 MED ORDER — METFORMIN HCL 850 MG PO TABS: 850.0000 mg | ORAL_TABLET | Freq: Two times a day (BID) | ORAL | 2 refills | Status: DC

## 2017-05-03 MED ORDER — ATENOLOL 50 MG PO TABS: 50.0000 mg | ORAL_TABLET | Freq: Every day | ORAL | 3 refills | Status: DC

## 2017-05-03 MED ORDER — TRUEPLUS LANCETS 28G MISC: 28.0000 g | Freq: Four times a day (QID) | 3 refills | Status: DC

## 2017-05-03 MED ORDER — LEVOTHYROXINE SODIUM 200 MCG PO TABS: 200.0000 ug | ORAL_TABLET | Freq: Every day | ORAL | 3 refills | Status: DC

## 2017-05-03 MED ORDER — TRUE METRIX METER DEVI: 1.0000 | Freq: Four times a day (QID) | 0 refills | Status: DC

## 2017-05-03 MED FILL — TRUE METRIX TEST STRIP: 25 days supply | Qty: 100 | Fill #0

## 2017-05-03 MED FILL — !TRUE METRIX BLOOD GLUCOSE: 1 days supply | Qty: 1 | Fill #0

## 2017-05-03 MED FILL — TRUEplus LANCETS 28G MISC: 25 days supply | Qty: 100 | Fill #0

## 2017-05-03 NOTE — Progress Notes (Signed)
Austin Beasley  [id number]:[id number]  [id number]:[id number]  DOB - 11/09/1980  Chief Complaint  Patient presents with  . Follow-up  . Diabetes       Subjective:   Austin Beasley is a 37 y.o. male here today for establishment of care. He has a history of hypertension and hypothyroidism. He smokes cigarettes and marijuana. He is obese. He presented to the emergency department on 04/24/2017 with polyuria, polydipsia, weakness, dizziness and fatigue. He also was having some tingling in his feet and some white discharge from his penile area. He checked his blood sugar from his mom's meter and it was greater than 600. In the emergency department he was given IV fluids, insulin and metformin. His glucose improved to 340 by the time of discharge.  He's been keeping an intermittent log of his blood sugars since he's getting anywhere from [place]. Compliant with his metformin. Needs a refill on Levaquin Roxicet and atenolol. Doesn't have much education in regards to his diabetes but has tried to change his diet. 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 He is trying to walk more. He continues to smoke about three quarters of a pack of cigarettes per day. Interested in financial assistance.  Much more energy now. Less weakness. Less urination and feeling of thirst. No chest pain. No shortness of breath. No dizziness. Occasional blurred vision.  ROS: GEN: denies fever or chills, denies change in weight Skin: denies lesions or rashes HEENT: denies headache, earache, epistaxis, sore throat, or neck pain; occasional blurred vision LUNGS: denies SHOB, dyspnea, PND, orthopnea CV: denies CP or palpitations ABD: denies abd pain, N or V EXT: denies muscle spasms or swelling; no pain in lower ext, no weakness NEURO: denies numbness or tingling, denies sz, stroke or TIA   ALLERGIES: No Known Allergies  PAST MEDICAL HISTORY: Past Medical History:  Diagnosis Date  . Hypertension   . Obesity   .  Thyroid disease     PAST SURGICAL HISTORY: Past Surgical History:  Procedure Laterality Date  . ELEVATION OF DEPRESSED SKULL FRACTURE     age 61    MEDICATIONS AT HOME: Prior to Admission medications   Medication Sig Start Date End Date Taking? Authorizing Provider  atenolol (TENORMIN) 50 MG tablet Take 1 tablet (50 mg total) by mouth daily. 05/03/17  Yes Ena Dawley, Tiffany S, PA-C  clotrimazole (LOTRIMIN) 1 % cream Apply to affected area 2 times daily 04/24/17  Yes Kirichenko, Misenheimer, PA-C  levothyroxine (SYNTHROID, LEVOTHROID) 200 MCG tablet Take 1 tablet (200 mcg total) by mouth daily before breakfast. 05/03/17  Yes Ena Dawley, Tiffany S, PA-C  metFORMIN (GLUCOPHAGE) 850 MG tablet Take 1 tablet (850 mg total) by mouth 2 (two) times daily with a meal. 05/03/17  Yes Ena Dawley, Tiffany S, PA-C  ranitidine (ZANTAC) 150 MG tablet Take 150 mg by mouth daily as needed for heartburn.    Yes [provider]  Blood Glucose Monitoring Suppl (TRUE METRIX METER) DEVI 1 kit by Does not apply route 4 (four) times daily. 05/03/17   Brayton Caves, PA-C  glucose blood (TRUE METRIX BLOOD GLUCOSE TEST) test strip Use as instructed 05/03/17   Brayton Caves, PA-C  HYDROcodone-acetaminophen (NORCO/VICODIN) 5-325 MG tablet Take 1 tablet by mouth every 4 (four) hours as needed. Patient not taking: Reported on 05/03/2017 03/16/17   Tegeler, Gwenyth Allegra, MD  TRUEPLUS LANCETS 28G MISC 28 g by Does not apply route QID. 05/03/17   Brayton Caves, PA-C  Family History  Problem Relation Age of Onset  . Diabetes Mother   . Cancer Mother   . Hypertension Father    Social-married, children, works as a Training and development officer, smokes cigarettes and MJ  Objective:   Vitals:   05/03/17 0909  BP: 121/81  Pulse: 65  Temp: (!) 97.5 F (36.4 C)  TempSrc: Oral  SpO2: 96%  Weight: (!) 392 lb 9.6 oz (178.1 kg)  Height: _0  (1.93 m)    Exam General appearance : Awake, alert, not in any distress. Speech Clear. Not toxic  looking HEENT: Atraumatic and Normocephalic, pupils equally reactive to light and accomodation Neck: supple, no JVD. No cervical lymphadenopathy.  Chest:Good air entry bilaterally, no added sounds  CVS: S1 S2 regular, no murmurs.  Abdomen: Bowel sounds present, Non tender and not distended with no guarding, rigidity or rebound. Extremities: B/L Lower Ext shows no edema, both legs are warm to touch Neurology: Awake alert, and oriented X 3, CN II-XII intact, Non focal Skin:No Rash Wounds:N/A  Data Review Lab Results  Component Value Date   HGBA1C 13.2 05/03/2017     Assessment & Plan  1. New DM 2  -referral for DM education  -Inc Metformin 850 BID  -provide meter/kit 2. HTN  -refill Atenolol   -low salt diet  -weight loss/exercise enocuraged 3. Hypothyroidism  -refilled Levothyroxine  4. Smoker  -cessation discussed  Financial counselor Return in about 2 weeks (around 05/17/2017).  The patient was given clear instructions to go to ER or return to medical center if symptoms don't improve, worsen or new problems develop. The patient verbalized understanding. The patient was told to call to get lab results if they haven't heard anything in the next week.   Total time spent with patient was 46 min . Greater than 50 % of this visit was spent face to face counseling and coordinating care regarding risk factor modification, compliance importance and encouragement, education related to Diabetes.  This note has been created with Surveyor, quantity. Any transcriptional errors are unintentional.    Zettie Pho, PA-C [facility] and [facility] [place], [place]   05/03/2017, 9:43 AM

## 2017-05-03 NOTE — Patient Instructions (Signed)
Aim for 30 minutes of exercise most days. Rethink what you drink. Water is great! Aim for 2-3 Carb Choices per meal (30-45 grams) +/- 1 either way  Aim for 0-15 Carbs per snack if hungry  Include protein in moderation with your meals and snacks  Consider reading food labels for Total Carbohydrate and Fat Grams of foods  Consider checking BG at alternate times per day  Continue taking medication as directed Be mindful about how much sugar you are adding to beverages and other foods. Fruit Punch - find one with no sugar  Measure and decrease portions of carbohydrate foods  Make your plate and don't go back for seconds  Smoking Tobacco Information Smoking tobacco will very likely harm your health. Tobacco contains a poisonous (toxic), colorless chemical called nicotine. Nicotine affects the brain and makes tobacco addictive. This change in your brain can make it hard to stop smoking. Tobacco also has other toxic chemicals that can hurt your body and raise your risk of many cancers. How can smoking tobacco affect me? Smoking tobacco can increase your chances of having serious health conditions, such as:  Cancer. Smoking is most commonly associated with lung cancer, but can lead to cancer in other parts of the body.  Chronic obstructive pulmonary disease (COPD). This is a long-term lung condition that makes it hard to breathe. It also gets worse over time.  High blood pressure (hypertension), heart disease, stroke, or heart attack.  Lung infections, such as pneumonia.  Cataracts. This is when the lenses in the eyes become clouded.  Digestive problems. This may include peptic ulcers, heartburn, and gastroesophageal reflux disease (GERD).  Oral health problems, such as gum disease and tooth loss.  Loss of taste and smell.  Smoking can affect your appearance by causing:  Wrinkles.  Yellow or stained teeth, fingers, and fingernails.  Smoking tobacco can also affect your social  life.  Many workplaces, [employer], hotels, and public places are tobacco-free. This means that you may experience challenges in finding places to smoke when away from home.  The cost of a smoking habit can be expensive. Expenses for someone who smokes come in two ways: ? You spend money on a regular basis to buy tobacco. ? Your health care costs in the long-term are higher if you smoke.  Tobacco smoke can also affect the health of those around you. Children of smokers have greater chances of: ? Sudden infant death syndrome (SIDS). ? Ear infections. ? Lung infections.  What lifestyle changes can be made?  Do not start smoking. Quit if you already do.  To quit smoking: ? Make a plan to quit smoking and commit yourself to it. Look for programs to help you and ask your health care provider for recommendations and ideas. ? Talk with your health care provider about using nicotine replacement medicines to help you quit. Medicine replacement medicines include gum, lozenges, patches, sprays, or pills. ? Do not replace cigarette smoking with electronic cigarettes, which are commonly called e-cigarettes. The safety of e-cigarettes is not known, and some may contain harmful chemicals. ? Avoid places, people, or situations that tempt you to smoke. ? If you try to quit but return to smoking, don't give up hope. It is very common for people to try a number of times before they fully succeed. When you feel ready again, give it another try.  Quitting smoking might affect the way you eat as well as your weight. Be prepared to monitor your eating habits. Get  support in planning and following a healthy diet.  Ask your health care provider about having regular tests (screenings) to check for cancer. This may include blood tests, imaging tests, and other tests.  Exercise regularly. Consider taking walks, joining a gym, or doing yoga or exercise classes.  Develop skills to manage your stress. These skills  include meditation. What are the benefits of quitting smoking? By quitting smoking, you may:  Lower your risk of getting cancer and other diseases caused by smoking.  Live longer.  Breathe better.  Lower your blood pressure and heart rate.  Stop your addiction to tobacco.  Stop creating secondhand smoke that hurts other people.  Improve your sense of taste and smell.  Look better over time, due to having fewer wrinkles and less staining.  What can happen if changes are not made? If you do not stop smoking, you may:  Get cancer and other diseases.  Develop COPD or other long-term (chronic) lung conditions.  Develop serious problems with your heart and blood vessels (cardiovascular system).  Need more tests to screen for problems caused by smoking.  Have higher, long-term healthcare costs from medicines or treatments related to smoking.  Continue to have worsening changes in your lungs, mouth, and nose.  Where to find support: To get support to quit smoking, consider:  Asking your health care provider for more information and resources.  Taking classes to learn more about quitting smoking.  Looking for local organizations that offer resources about quitting smoking.  Joining a support group for people who want to quit smoking in your local community.  Where to find more information: You may find more information about quitting smoking from:  HelpGuide.org: www.helpguide.org/articles/addictions/how-to-quit-smoking.htm  BankRights[employer]: smokefree.gov  American Lung Association: www.lung.org  Contact a health care provider if:  You have problems breathing.  Your lips, nose, or fingers turn blue.  You have chest pain.  You are coughing up blood.  You feel faint or you pass out.  You have other noticeable changes that cause you to worry. Summary  Smoking tobacco can negatively affect your health, the health of those around you, your finances, and your  social life.  Do not start smoking. Quit if you already do. If you need help quitting, ask your health care provider.  Think about joining a support group for people who want to quit smoking in your local community. There are many effective programs that will help you to quit this behavior. This information is not intended to replace advice given to you by your health care provider. Make sure you discuss any questions you have with your health care provider. Document Released: 03/08/2016 Document Revised: 03/08/2016 Document Reviewed: 03/08/2016 Elsevier Interactive Patient Education  [employer].

## 2017-05-23 ENCOUNTER — Encounter: Payer: Self-pay | Admitting: Nurse Practitioner

## 2017-05-23 ENCOUNTER — Ambulatory Visit: Payer: Self-pay | Attending: Nurse Practitioner | Admitting: Nurse Practitioner

## 2017-05-23 VITALS — BP 114/74 | HR 67 | Temp 98.1°F | Ht 72.0 in | Wt 383.0 lb

## 2017-05-23 DIAGNOSIS — E11649 Type 2 diabetes mellitus with hypoglycemia without coma: Secondary | ICD-10-CM | POA: Insufficient documentation

## 2017-05-23 DIAGNOSIS — Z79899 Other long term (current) drug therapy: Secondary | ICD-10-CM | POA: Insufficient documentation

## 2017-05-23 DIAGNOSIS — E669 Obesity, unspecified: Secondary | ICD-10-CM | POA: Insufficient documentation

## 2017-05-23 DIAGNOSIS — Z7984 Long term (current) use of oral hypoglycemic drugs: Secondary | ICD-10-CM | POA: Insufficient documentation

## 2017-05-23 DIAGNOSIS — E119 Type 2 diabetes mellitus without complications: Secondary | ICD-10-CM | POA: Insufficient documentation

## 2017-05-23 DIAGNOSIS — I10 Essential (primary) hypertension: Secondary | ICD-10-CM | POA: Insufficient documentation

## 2017-05-23 DIAGNOSIS — Z760 Encounter for issue of repeat prescription: Secondary | ICD-10-CM | POA: Insufficient documentation

## 2017-05-23 DIAGNOSIS — E1140 Type 2 diabetes mellitus with diabetic neuropathy, unspecified: Secondary | ICD-10-CM | POA: Insufficient documentation

## 2017-05-23 DIAGNOSIS — E1165 Type 2 diabetes mellitus with hyperglycemia: Secondary | ICD-10-CM | POA: Insufficient documentation

## 2017-05-23 DIAGNOSIS — Z6841 Body Mass Index (BMI) 40.0 and over, adult: Secondary | ICD-10-CM

## 2017-05-23 DIAGNOSIS — E079 Disorder of thyroid, unspecified: Secondary | ICD-10-CM | POA: Insufficient documentation

## 2017-05-23 LAB — GLUCOSE, POCT (MANUAL RESULT ENTRY): POC Glucose: 87 mg/dl (ref 70–99)

## 2017-05-23 MED ORDER — TRUEPLUS LANCETS 28G MISC: 28.0000 g | Freq: Four times a day (QID) | 3 refills | Status: DC

## 2017-05-23 MED ORDER — GABAPENTIN 100 MG PO CAPS: 100.0000 mg | ORAL_CAPSULE | Freq: Three times a day (TID) | ORAL | 3 refills | Status: DC

## 2017-05-23 MED ORDER — ATENOLOL 50 MG PO TABS: 50.0000 mg | ORAL_TABLET | Freq: Every day | ORAL | 3 refills | Status: DC

## 2017-05-23 MED ORDER — GLUCOSE BLOOD VI STRP: ORAL_STRIP | 12 refills | Status: DC

## 2017-05-23 MED ORDER — METFORMIN HCL 850 MG PO TABS: 850.0000 mg | ORAL_TABLET | Freq: Two times a day (BID) | ORAL | 2 refills | Status: DC

## 2017-05-23 MED ORDER — LEVOTHYROXINE SODIUM 200 MCG PO TABS: 200.0000 ug | ORAL_TABLET | Freq: Every day | ORAL | 3 refills | Status: DC

## 2017-05-23 NOTE — Progress Notes (Signed)
cc

## 2017-05-23 NOTE — Progress Notes (Signed)
Assessment & Plan:  Austin Beasley was seen today for establish care and medication refill.  Diagnoses and all orders for this visit:  Type 2 diabetes mellitus with diabetic neuropathy, without long-term current use of insulin (HCC) -     Glucose (CBG) -     Referral to Nutrition and Diabetes Services  Other orders -     atenolol (TENORMIN) 50 MG tablet; Take 1 tablet (50 mg total) by mouth daily. -     levothyroxine (SYNTHROID, LEVOTHROID) 200 MCG tablet; Take 1 tablet (200 mcg total) by mouth daily before breakfast. -     metFORMIN (GLUCOPHAGE) 850 MG tablet; Take 1 tablet (850 mg total) by mouth 2 (two) times daily with a meal. -     gabapentin (NEURONTIN) 100 MG capsule; Take 1 capsule (100 mg total) by mouth 3 (three) times daily. -     glucose blood (TRUE METRIX BLOOD GLUCOSE TEST) test strip; Use as instructed -     TRUEPLUS LANCETS 28G MISC; 28 g by Does not apply route QID. Continue blood sugar control as discussed in office today, low carbohydrate diet, and regular physical exercise as tolerated, 150 minutes per week (30 min each day, 5 days per week, or 50 min 3 days per week). Keep blood sugar logs with fasting goal of 80-130 mg/dl, post prandial less than 180.  For Hypoglycemia: BS <60 and Hyperglycemia BS >400; contact the clinic ASAP. Annual eye exams and foot exams are recommended.  Obesity Discussed diet and exercise for person with BMI >25. Instructed: You must burn more calories than you eat. Losing 5 percent of your body weight should be considered a success. In the longer term, losing more than 15 percent of your body weight and staying at this weight is an extremely good result. However, keep in mind that even losing 5 percent of your body weight leads to important health benefits, so try not to get discouraged if you're not able to lose more than this. Will recheck weight in 3-6 months.  Patient has been counseled on age-appropriate routine health concerns for screening  and prevention. These are reviewed and up-to-date. Referrals have been placed accordingly. Immunizations are up-to-date or declined.    Subjective:   Chief Complaint  Patient presents with  . Establish Care    Patient is here to establish care for diabetes. Pt. would like diabetes advising.   . Medication Refill   HPI Austin Beasley 37 y.o. male presents to office today   Diabetes Mellitus Type 2 New onset. Diagnosed 04-2017. He is monitoring CBG levels BID: Fasting: 106-110s Post Prandial: 110-120s. Taking medication as prescribed. Endorses loose stools with metformin but tolerable. He would like diabetes education. He has a general knowledge but needs more education regarding carb counting and nutrition. He has lost 11 lbs since his last office visit. 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 Will have him speak with the pharmacist on site as well as refer to nutritionist once he has been approved for orange card. He will need to speak with the financial counselor. He denies hypoglycemic symptoms. Endorses neuropathy in both feet. He continues to smoke cigarettes.  Lab Results  Component Value Date   HGBA1C 13.2 05/03/2017     Review of Systems  Constitutional: Negative for fever, malaise/fatigue and weight loss.  HENT: Negative.  Negative for nosebleeds.   Eyes: Negative.  Negative for blurred vision,  double vision and photophobia.  Respiratory: Negative.  Negative for cough and shortness of breath.   Cardiovascular: Negative.  Negative for chest pain, palpitations and leg swelling.  Gastrointestinal: Negative.  Negative for heartburn, nausea and vomiting.  Musculoskeletal: Negative.  Negative for myalgias.  Neurological: Positive for tingling and sensory change. Negative for dizziness, focal weakness, seizures and headaches.  Psychiatric/Behavioral: Negative.  Negative for suicidal ideas.    Past  Medical History:  Diagnosis Date  . Diabetes mellitus without complication ([place])   . Hypertension   . Obesity   . Thyroid disease     Past Surgical History:  Procedure Laterality Date  . ELEVATION OF DEPRESSED SKULL FRACTURE     age 66    Family History  Problem Relation Age of Onset  . Diabetes Mother   . Cancer Mother   . Hypertension Father   . Cancer Sister   . Birth defects Maternal Grandmother   . Birth defects Maternal Grandfather   . Birth defects Paternal Grandmother   . Birth defects Paternal Grandfather     Social History Reviewed with no changes to be made today.   Outpatient Medications Prior to Visit  Medication Sig Dispense Refill  . Blood Glucose Monitoring Suppl (TRUE METRIX METER) DEVI 1 kit by Does not apply route 4 (four) times daily. 1 Device 0  . ranitidine (ZANTAC) 150 MG tablet Take 150 mg by mouth daily as needed for heartburn.     Marland Kitchen atenolol (TENORMIN) 50 MG tablet Take 1 tablet (50 mg total) by mouth daily. 30 tablet 3  . glucose blood (TRUE METRIX BLOOD GLUCOSE TEST) test strip Use as instructed 100 each 12  . levothyroxine (SYNTHROID, LEVOTHROID) 200 MCG tablet Take 1 tablet (200 mcg total) by mouth daily before breakfast. 30 tablet 3  . metFORMIN (GLUCOPHAGE) 850 MG tablet Take 1 tablet (850 mg total) by mouth 2 (two) times daily with a meal. 60 tablet 2  . TRUEPLUS LANCETS 28G MISC 28 g by Does not apply route QID. 120 each 3  . clotrimazole (LOTRIMIN) 1 % cream Apply to affected area 2 times daily (Patient not taking: Reported on 05/23/2017) 15 g 0  . HYDROcodone-acetaminophen (NORCO/VICODIN) 5-325 MG tablet Take 1 tablet by mouth every 4 (four) hours as needed. (Patient not taking: Reported on 05/03/2017) 10 tablet 0   No facility-administered medications prior to visit.     No Known Allergies     Objective:    BP 114/74 (BP Location: Left Arm, Patient Position: Sitting, Cuff Size: Large)   Pulse 67   Temp 98.1 F (36.7 C) (Oral)   Ht  6' (1.829 m)   Wt (!) 383 lb (173.7 kg)   SpO2 93%   BMI 51.94 kg/m  Wt Readings from Last 3 Encounters:  05/23/17 (!) 383 lb (173.7 kg)  05/03/17 (!) 392 lb 9.6 oz (178.1 kg)  03/16/17 (!) 385 lb (174.6 kg)    Physical Exam  Constitutional: He is oriented to person, place, and time. He appears well-developed and well-nourished. He is cooperative.  HENT:  Head: Normocephalic and atraumatic.  Eyes: EOM are normal.  Neck: Normal range of motion.  Cardiovascular: Normal rate, regular rhythm and normal heart sounds. Exam reveals no gallop and no friction rub.  No murmur heard. Pulmonary/Chest: Effort normal and breath sounds normal. No tachypnea. No respiratory distress. He has no decreased breath sounds. He has no wheezes. He has no rhonchi. He has no rales. He exhibits no tenderness.  Abdominal: Soft. Bowel sounds are normal.  Musculoskeletal: Normal range of motion. He exhibits no edema.  Neurological: He is alert and oriented to person, place, and time. Coordination normal.  Skin: Skin is warm and dry.  Psychiatric: He has a normal mood and affect. His behavior is normal. Judgment and thought content normal.  Nursing note and vitals reviewed.        Patient has been counseled extensively about nutrition and exercise as well as the importance of adherence with medications and regular follow-up. The patient was given clear instructions to go to ER or return to medical center if symptoms don't improve, worsen or new problems develop. The patient verbalized understanding.   Follow-up: Return in about 4 weeks (around 06/20/2017) for DM , Needs appointment with financial representative.Gildardo Pounds, FNP-BC [facility] and [facility] [place], [place]   05/23/2017, 6:46 PM

## 2017-05-23 NOTE — Patient Instructions (Addendum)
Diabetes Mellitus and Nutrition When you have diabetes (diabetes mellitus), it is very important to have healthy eating habits because your blood sugar (glucose) levels are greatly affected by what you eat and drink. Eating healthy foods in the appropriate amounts, at about the same times every day, can help you:  Control your blood glucose.  Lower your risk of heart disease.  Improve your blood pressure.  Reach or maintain a healthy weight.  Every person with diabetes is different, and each person has different needs for a meal plan. Your health care provider may recommend that you work with a diet and nutrition specialist (dietitian) to make a meal plan that is best for you. Your meal plan may vary depending on factors such as:  The calories you need.  The medicines you take.  Your weight.  Your blood glucose, blood pressure, and cholesterol levels.  Your activity level.  Other health conditions you have, such as heart or kidney disease.  How do carbohydrates affect me? Carbohydrates affect your blood glucose level more than any other type of food. Eating carbohydrates naturally increases the amount of glucose in your blood. Carbohydrate counting is a method for keeping track of how many carbohydrates you eat. Counting carbohydrates is important to keep your blood glucose at a healthy level, especially if you use insulin or take certain oral diabetes medicines. It is important to know how many carbohydrates you can safely have in each meal. This is different for every person. Your dietitian can help you calculate how many carbohydrates you should have at each meal and for snack. Foods that contain carbohydrates include:  Bread, cereal, rice, pasta, and crackers.  Potatoes and corn.  Peas, beans, and lentils.  Milk and yogurt.  Fruit and juice.  Desserts, such as cakes, cookies, ice cream, and candy.  How does alcohol affect me? Alcohol can cause a sudden decrease in blood  glucose (hypoglycemia), especially if you use insulin or take certain oral diabetes medicines. Hypoglycemia can be a life-threatening condition. Symptoms of hypoglycemia (sleepiness, dizziness, and confusion) are similar to symptoms of having too much alcohol. If your health care provider says that alcohol is safe for you, follow these guidelines:  Limit alcohol intake to no more than 1 drink per day for nonpregnant women and 2 drinks per day for men. One drink equals 12 oz of beer, 5 oz of wine, or 1 oz of hard liquor.  Do not drink on an empty stomach.  Keep yourself hydrated with water, diet soda, or unsweetened iced tea.  Keep in mind that regular soda, juice, and other mixers may contain a lot of sugar and must be counted as carbohydrates.  What are tips for following this plan? Reading food labels  Start by checking the serving size on the label. The amount of calories, carbohydrates, fats, and other nutrients listed on the label are based on one serving of the food. Many foods contain more than one serving per package.  Check the total grams (g) of carbohydrates in one serving. You can calculate the number of servings of carbohydrates in one serving by dividing the total carbohydrates by 15. For example, if a food has 30 g of total carbohydrates, it would be equal to 2 servings of carbohydrates.  Check the number of grams (g) of saturated and trans fats in one serving. Choose foods that have low or no amount of these fats.  Check the number of milligrams (mg) of sodium in one serving. Most people   should limit total sodium intake to less than 2,300 mg per day.  Always check the nutrition information of foods labeled as "low-fat" or "nonfat". These foods may be higher in added sugar or refined carbohydrates and should be avoided.  Talk to your dietitian to identify your daily goals for nutrients listed on the label. Shopping  Avoid buying canned, premade, or processed foods. These  foods tend to be high in fat, sodium, and added sugar.  Shop around the outside edge of the grocery store. This includes fresh fruits and vegetables, bulk grains, fresh meats, and fresh dairy. Cooking  Use low-heat cooking methods, such as baking, instead of high-heat cooking methods like deep frying.  Cook using healthy oils, such as olive, canola, or sunflower oil.  Avoid cooking with butter, cream, or high-fat meats. Meal planning  Eat meals and snacks regularly, preferably at the same times every day. Avoid going long periods of time without eating.  Eat foods high in fiber, such as fresh fruits, vegetables, beans, and whole grains. Talk to your dietitian about how many servings of carbohydrates you can eat at each meal.  Eat 4-6 ounces of lean protein each day, such as lean meat, chicken, fish, eggs, or tofu. 1 ounce is equal to 1 ounce of meat, chicken, or fish, 1 egg, or 1/4 cup of tofu.  Eat some foods each day that contain healthy fats, such as avocado, nuts, seeds, and fish. Lifestyle   Check your blood glucose regularly.  Exercise at least 30 minutes 5 or more days each week, or as told by your health care provider.  Take medicines as told by your health care provider.  Do not use any products that contain nicotine or tobacco, such as cigarettes and e-cigarettes. If you need help quitting, ask your health care provider.  Work with a Veterinary surgeon or diabetes educator to identify strategies to manage stress and any emotional and social challenges. What are some questions to ask my health care provider?  Do I need to meet with a diabetes educator?  Do I need to meet with a dietitian?  What number can I call if I have questions?  When are the best times to check my blood glucose? Where to find more information:  American Diabetes Association: diabetes.org/food-and-fitness/food  Academy of Nutrition and Dietetics:  https://www.vargas.com/  [employer] of Diabetes and Digestive and Kidney Diseases (NIH): FindJewelers.cz Summary  A healthy meal plan will help you control your blood glucose and maintain a healthy lifestyle.  Working with a diet and nutrition specialist (dietitian) can help you make a meal plan that is best for you.  Keep in mind that carbohydrates and alcohol have immediate effects on your blood glucose levels. It is important to count carbohydrates and to use alcohol carefully. This information is not intended to replace advice given to you by your health care provider. Make sure you discuss any questions you have with your health care provider. Document Released: 11/18/2004 Document Revised: 03/28/2016 Document Reviewed: 03/28/2016 Elsevier Interactive Patient Education  2018 [employer]. Daily Diabetes Record Introduction Check your blood glucose (BG) as directed by your health care provider. Use this form to record your BG results as well as any diabetes medicines that you take, including insulin. Bringing a record of your BG results and a list of your current medicines to your health care provider is very helpful in managing your diabetes. These numbers help your health care provider to know whether your diabetes management plan needs to  be changed. Patient name: ____________________________________ Week of ____________________ Daily BG results and diabetes medicines Date: _________  Breakfast - BG / Medicines: ________________ / __________________________________________________________  Lunch - BG / Medicines: ___________________ / __________________________________________________________  Dinner - BG / Medicines: __________________ / __________________________________________________________  Bedtime - BG / Medicines: ________________ /  ___________________________________________________________  Date: _________  Breakfast - BG / Medicines: ________________ / __________________________________________________________  Lunch - BG / Medicines: ___________________ / __________________________________________________________  Dinner - BG / Medicines: __________________ / __________________________________________________________  Bedtime - BG / Medicines: ________________ / ___________________________________________________________  Date: _________  Breakfast - BG / Medicines: ________________ / __________________________________________________________  Lunch - BG / Medicines: ___________________ / __________________________________________________________  Dinner - BG / Medicines: __________________ / __________________________________________________________  Bedtime - BG / Medicines: ________________ / ___________________________________________________________  Date: _________  Breakfast - BG / Medicines: ________________ / __________________________________________________________  Lunch - BG / Medicines: ___________________ / __________________________________________________________  Dinner - BG / Medicines: __________________ / __________________________________________________________  Bedtime - BG / Medicines: ________________ / ___________________________________________________________  Date: _________  Breakfast - BG / Medicines: ________________ / __________________________________________________________  Lunch - BG / Medicines: ___________________ / __________________________________________________________  Dinner - BG / Medicines: __________________ / __________________________________________________________  Bedtime - BG / Medicines: ________________ / ___________________________________________________________  Date: _________  Breakfast - BG / Medicines: ________________ /  __________________________________________________________  Lunch - BG / Medicines: ___________________ / __________________________________________________________  Dinner - BG / Medicines: __________________ / __________________________________________________________  Bedtime - BG / Medicines: ________________ / ___________________________________________________________  Date: _________  Breakfast - BG / Medicines: ________________ / __________________________________________________________  Lunch - BG / Medicines: ___________________ / __________________________________________________________  Dinner - BG / Medicines: __________________ / __________________________________________________________  Bedtime - BG / Medicines: ________________ / ___________________________________________________________  Notes: ______________________________________________________________________________________________________________________ This information is not intended to replace advice given to you by your health care provider. Make sure you discuss any questions you have with your health care provider. Document Released: 01/26/2004 Document Revised: 11/20/2015 Document Reviewed: 11/20/2015 Elsevier Interactive Patient Education  2018 [employer].  How to Avoid Diabetes Mellitus Problems You can take action to prevent or slow down problems that are caused by diabetes (diabetes mellitus). Following your diabetes plan and taking care of yourself can reduce your risk of serious or life-threatening complications. Manage your diabetes  Follow instructions from your health care providers about managing your diabetes. Your diabetes may be managed by a team of health care providers who can teach you how to care for yourself and can answer questions that you have.  Educate yourself about your condition so you can make healthy choices about eating and physical activity.  Check your blood sugar  (glucose) levels as often as directed. Your health care provider will help you decide how often to check your blood glucose level depending on your treatment goals and how well you are meeting them.  Ask your health care provider if you should take low-dose aspirin daily and what dose is recommended for you. Taking low-dose aspirin daily is recommended to help prevent cardiovascular disease. Do not use nicotine or tobacco Do not use any products that contain nicotine or tobacco, such as cigarettes and e-cigarettes. If you need help quitting, ask your health care provider. Nicotine raises your risk for diabetes problems. If you quit using nicotine:  You will lower your risk for heart attack, stroke, nerve disease, and kidney disease.  Your cholesterol and blood pressure may improve.  Your blood circulation will improve.  Keep your blood pressure under control To control your blood pressure:  Follow instructions from your health  care provider about meal planning, exercise, and medicines.  Make sure your health care provider checks your blood pressure at every medical visit.  A blood pressure reading consists of two numbers. Generally, the goal is to keep your top number (systolic pressure) at or below 130, and your bottom number (diastolic pressure) at or below 80. Your health care provider may recommend a lower target blood pressure. Your individualized target blood pressure is determined based on:  Your age.  Your medicines.  How long you have had diabetes.  Any other medical conditions you have.  Keep your cholesterol under control To control your cholesterol:  Follow instructions from your health care provider about meal planning, exercise, and medicines.  Have your cholesterol checked at least once a year.  You may be prescribed medicine to lower cholesterol (statin). If you are not taking a statin, ask your health care provider if you should be.  Controlling your cholesterol  may:  Help prevent heart disease and stroke. These are the most common health problems for people with diabetes.  Improve your blood flow.  Schedule and keep yearly physical exams and eye exams Your health care provider will tell you how often you need medical visits depending on your diabetes management plan. Keep all follow-up visits as directed. This is important so possible problems can be identified early and complications can be avoided or treated.  Every visit with your health care provider should include measuring your: ? Weight. ? Blood pressure. ? Blood glucose control.  Your A1c (hemoglobin A1c) level should be checked: ? At least 2 times a year, if you are meeting your treatment goals. ? 4 times a year, if you are not meeting treatment goals or if your treatment goals have changed.  Your blood lipids (lipid profile) should be checked yearly. You should also be checked yearly for protein in your urine (urine microalbumin).  If you have type 1 diabetes, get an eye exam 3-5 years after you are diagnosed, and then once a year after your first exam.  If you have type 2 diabetes, get an eye exam as soon as you are diagnosed, and then once a year after your first exam.  Keep your vaccines current It is recommended that you receive:  A flu (influenza) vaccine every year.  A pneumonia (pneumococcal) vaccine and a hepatitis B vaccine. If you are age 38 or older, you may get the pneumonia vaccine as a series of two separate shots.  Ask your health care provider which other vaccines may be recommended. Take care of your feet Diabetes may cause you to have poor blood circulation to your legs and feet. Because of this, taking care of your feet is very important. Diabetes can cause:  The skin on the feet to get thinner, break more easily, and heal more slowly.  Nerve damage in your legs and feet, which results in decreased feeling. You may not notice minor injuries that could lead  to serious problems.  To avoid foot problems:  Check your skin and feet every day for cuts, bruises, redness, blisters, or sores.  Schedule a foot exam with your health care provider once every year. This exam includes: ? Inspecting of the structure and skin of your feet. ? Checking the pulses and sensation in your feet.  Make sure that your health care provider performs a visual foot exam at every medical visit.  Take care of your teeth People with poorly controlled diabetes are more likely to have gum (  periodontal) disease. Diabetes can make periodontal diseases harder to control. If not treated, periodontal diseases can lead to tooth loss. To prevent this:  Brush your teeth twice a day.  Floss at least once a day.  Visit your dentist 2 times a year.  Drink responsibly Limit alcohol intake to no more than 1 drink a day for nonpregnant women and 2 drinks a day for men. One drink equals 12 oz of beer, 5 oz of wine, or 1 oz of hard liquor. It is important to eat food when you drink alcohol to avoid low blood glucose (hypoglycemia). Avoid alcohol if you:  Have a history of alcohol abuse or dependence.  Are pregnant.  Have liver disease, pancreatitis, advanced neuropathy, or severe hypertriglyceridemia.  Lessen stress Living with diabetes can be stressful. When you are experiencing stress, your blood glucose may be affected in two ways:  Stress hormones may cause your blood glucose to rise.  You may be distracted from taking good care of yourself.  Be aware of your stress level and make changes to help you manage challenging situations. To lower your stress levels:  Consider joining a support group.  Do planned relaxation or meditation.  Do a hobby that you enjoy.  Maintain healthy relationships.  Exercise regularly.  Work with your health care provider or a mental health professional.  Summary  You can take action to prevent or slow down problems that are caused by  diabetes (diabetes mellitus). Following your diabetes plan and taking care of yourself can reduce your risk of serious or life-threatening complications.  Follow instructions from your health care providers about managing your diabetes. Your diabetes may be managed by a team of health care providers who can teach you how to care for yourself and can answer questions that you have.  Your health care provider will tell you how often you need medical visits depending on your diabetes management plan. Keep all follow-up visits as directed. This is important so possible problems can be identified early and complications can be avoided or treated. This information is not intended to replace advice given to you by your health care provider. Make sure you discuss any questions you have with your health care provider. Document Released: 11/09/2010 Document Revised: 11/21/2015 Document Reviewed: 11/21/2015 Elsevier Interactive Patient Education  [employer].

## 2017-05-29 MED FILL — GABAPENTIN 100 MG CAPSULE: 100 | 30 days supply | Qty: 90 | Fill #0

## 2017-05-31 ENCOUNTER — Other Ambulatory Visit: Payer: Self-pay

## 2017-06-01 MED FILL — TRUE METRIX TEST STRIP: 30 days supply | Qty: 100 | Fill #0

## 2017-06-01 MED FILL — TRUEplus LANCETS 28G MISC: 25 days supply | Qty: 100 | Fill #0

## 2017-06-05 ENCOUNTER — Other Ambulatory Visit: Payer: Self-pay | Admitting: Nurse Practitioner

## 2017-06-05 ENCOUNTER — Ambulatory Visit: Payer: Self-pay | Attending: Nurse Practitioner

## 2017-06-05 DIAGNOSIS — E1165 Type 2 diabetes mellitus with hyperglycemia: Secondary | ICD-10-CM

## 2017-06-05 DIAGNOSIS — Z6841 Body Mass Index (BMI) 40.0 and over, adult: Principal | ICD-10-CM

## 2017-06-05 NOTE — Progress Notes (Signed)
Patient here for lab visit only 

## 2017-06-06 LAB — CMP14+EGFR
ALT: 32 IU/L (ref 0–44)
AST: 22 IU/L (ref 0–40)
Albumin/Globulin Ratio: 1.5 (ref 1.2–2.2)
Albumin: 4.1 g/dL (ref 3.5–5.5)
Alkaline Phosphatase: 69 IU/L (ref 39–117)
BUN/Creatinine Ratio: 11 (ref 9–20)
BUN: 10 mg/dL (ref 6–20)
Bilirubin Total: 0.4 mg/dL (ref 0.0–1.2)
CO2: 24 mmol/L (ref 20–29)
Calcium: 9.7 mg/dL (ref 8.7–10.2)
Chloride: 103 mmol/L (ref 96–106)
Creatinine, Ser: 0.93 mg/dL (ref 0.76–1.27)
GFR calc Af Amer: 121 mL/min/{1.73_m2} (ref >59)
GFR calc non Af Amer: 105 mL/min/{1.73_m2} (ref >59)
Globulin, Total: 2.7 g/dL (ref 1.5–4.5)
Glucose: 97 mg/dL (ref 65–99)
Potassium: 4.8 mmol/L (ref 3.5–5.2)
Sodium: 143 mmol/L (ref 134–144)
Total Protein: 6.8 g/dL (ref 6.0–8.5)

## 2017-06-06 LAB — CBC
Hematocrit: 40.8 % (ref 37.5–51.0)
Hemoglobin: 13.9 g/dL (ref 13.0–17.7)
MCH: 32.2 pg (ref 26.6–33.0)
MCHC: 34.1 g/dL (ref 31.5–35.7)
MCV: 94 fL (ref 79–97)
Platelets: 360 10*3/uL (ref 150–379)
RBC: 4.32 x10E6/uL (ref 4.14–5.80)
RDW: 13.6 % (ref 12.3–15.4)
WBC: 9.2 10*3/uL (ref 3.4–10.8)

## 2017-06-06 LAB — LIPID PANEL
Chol/HDL Ratio: 3.5 ratio (ref 0.0–5.0)
Cholesterol, Total: 138 mg/dL (ref 100–199)
HDL: 39 mg/dL — ABNORMAL LOW (ref >39)
LDL Calculated: 69 mg/dL (ref 0–99)
Triglycerides: 149 mg/dL (ref 0–149)
VLDL Cholesterol Cal: 30 mg/dL (ref 5–40)

## 2017-06-06 LAB — TSH: TSH: 2.12 u[IU]/mL (ref 0.450–4.500)

## 2017-06-14 ENCOUNTER — Ambulatory Visit: Payer: Self-pay | Attending: Nurse Practitioner

## 2017-06-20 ENCOUNTER — Ambulatory Visit: Payer: Self-pay | Admitting: Skilled Nursing Facility1

## 2017-06-27 ENCOUNTER — Ambulatory Visit: Payer: Self-pay | Attending: Nurse Practitioner | Admitting: Nurse Practitioner

## 2017-06-27 ENCOUNTER — Encounter: Payer: Self-pay | Admitting: Nurse Practitioner

## 2017-06-27 VITALS — BP 123/76 | HR 63 | Temp 98.2°F | Ht 73.0 in | Wt 370.0 lb

## 2017-06-27 DIAGNOSIS — Z79899 Other long term (current) drug therapy: Secondary | ICD-10-CM | POA: Insufficient documentation

## 2017-06-27 DIAGNOSIS — Z760 Encounter for issue of repeat prescription: Secondary | ICD-10-CM | POA: Insufficient documentation

## 2017-06-27 DIAGNOSIS — I10 Essential (primary) hypertension: Secondary | ICD-10-CM | POA: Insufficient documentation

## 2017-06-27 DIAGNOSIS — Z7984 Long term (current) use of oral hypoglycemic drugs: Secondary | ICD-10-CM | POA: Insufficient documentation

## 2017-06-27 DIAGNOSIS — Z79890 Hormone replacement therapy (postmenopausal): Secondary | ICD-10-CM | POA: Insufficient documentation

## 2017-06-27 DIAGNOSIS — Z716 Tobacco abuse counseling: Secondary | ICD-10-CM | POA: Insufficient documentation

## 2017-06-27 DIAGNOSIS — E669 Obesity, unspecified: Secondary | ICD-10-CM | POA: Insufficient documentation

## 2017-06-27 DIAGNOSIS — Z6841 Body Mass Index (BMI) 40.0 and over, adult: Secondary | ICD-10-CM | POA: Insufficient documentation

## 2017-06-27 DIAGNOSIS — E119 Type 2 diabetes mellitus without complications: Secondary | ICD-10-CM | POA: Insufficient documentation

## 2017-06-27 DIAGNOSIS — E1165 Type 2 diabetes mellitus with hyperglycemia: Secondary | ICD-10-CM

## 2017-06-27 DIAGNOSIS — E079 Disorder of thyroid, unspecified: Secondary | ICD-10-CM | POA: Insufficient documentation

## 2017-06-27 DIAGNOSIS — F17200 Nicotine dependence, unspecified, uncomplicated: Secondary | ICD-10-CM

## 2017-06-27 DIAGNOSIS — F17210 Nicotine dependence, cigarettes, uncomplicated: Secondary | ICD-10-CM | POA: Insufficient documentation

## 2017-06-27 LAB — GLUCOSE, POCT (MANUAL RESULT ENTRY): POC Glucose: 117 mg/dl — AB (ref 70–99)

## 2017-06-27 MED ORDER — GLUCOSE BLOOD VI STRP: ORAL_STRIP | 12 refills | Status: DC

## 2017-06-27 NOTE — Patient Instructions (Addendum)
1-800-QUIT-NOW   Coping with Quitting Smoking Quitting smoking is a physical and mental challenge. You will face cravings, withdrawal symptoms, and temptation. Before quitting, work with your health care provider to make a plan that can help you cope. Preparation can help you quit and keep you from giving in. How can I cope with cravings? Cravings usually last for 5-10 minutes. If you get through it, the craving will pass. Consider taking the following actions to help you cope with cravings:  Keep your mouth busy: ? Chew sugar-free gum. ? Suck on hard candies or a straw. ? Brush your teeth.  Keep your hands and body busy: ? Immediately change to a different activity when you feel a craving. ? Squeeze or play with a ball. ? Do an activity or a hobby, like making bead jewelry, practicing needlepoint, or working with wood. ? Mix up your normal routine. ? Take a short exercise break. Go for a quick walk or run up and down stairs. ? Spend time in public places where smoking is not allowed.  Focus on doing something kind or helpful for someone else.  Call a friend or family member to talk during a craving.  Join a support group.  Call a quit line, such as 1-800-QUIT-NOW.  Talk with your health care provider about medicines that might help you cope with cravings and make quitting easier for you.  How can I deal with withdrawal symptoms? Your body may experience negative effects as it tries to get used to not having nicotine in the system. These effects are called withdrawal symptoms. They may include:  Feeling hungrier than normal.  Trouble concentrating.  Irritability.  Trouble sleeping.  Feeling depressed.  Restlessness and agitation.  Craving a cigarette.  To manage withdrawal symptoms:  Avoid places, people, and activities that trigger your cravings.  Remember why you want to quit.  Get plenty of sleep.  Avoid coffee and other caffeinated drinks. These may worsen  some of your symptoms.  How can I handle social situations? Social situations can be difficult when you are quitting smoking, especially in the first few weeks. To manage this, you can:  Avoid parties, bars, and other social situations where people might be smoking.  Avoid alcohol.  Leave right away if you have the urge to smoke.  Explain to your family and friends that you are quitting smoking. Ask for understanding and support.  Plan activities with friends or family where smoking is not an option.  What are some ways I can cope with stress? Wanting to smoke may cause stress, and stress can make you want to smoke. Find ways to manage your stress. Relaxation techniques can help. For example:  Breathe slowly and deeply, in through your nose and out through your mouth.  Listen to soothing, relaxing music.  Talk with a family member or friend about your stress.  Light a candle.  Soak in a bath or take a shower.  Think about a peaceful place.  What are some ways I can prevent weight gain? Be aware that many people gain weight after they quit smoking. However, not everyone does. To keep from gaining weight, have a plan in place before you quit and stick to the plan after you quit. Your plan should include:  Having healthy snacks. When you have a craving, it may help to: ? Eat plain popcorn, crunchy carrots, celery, or other cut vegetables. ? Chew sugar-free gum.  Changing how you eat: ? Eat small portion sizes  at meals. ? Eat 4-6 small meals throughout the day instead of 1-2 large meals a day. ? Be mindful when you eat. Do not watch television or do other things that might distract you as you eat.  Exercising regularly: ? Make time to exercise each day. If you do not have time for a long workout, do short bouts of exercise for 5-10 minutes several times a day. ? Do some form of strengthening exercise, like weight lifting, and some form of aerobic exercise, like running or  swimming.  Drinking plenty of water or other low-calorie or no-calorie drinks. Drink 6-8 glasses of water daily, or as much as instructed by your health care provider.  Summary  Quitting smoking is a physical and mental challenge. You will face cravings, withdrawal symptoms, and temptation to smoke again. Preparation can help you as you go through these challenges.  You can cope with cravings by keeping your mouth busy (such as by chewing gum), keeping your body and hands busy, and making calls to family, friends, or a helpline for people who want to quit smoking.  You can cope with withdrawal symptoms by avoiding places where people smoke, avoiding drinks with caffeine, and getting plenty of rest.  Ask your health care provider about the different ways to prevent weight gain, avoid stress, and handle social situations. This information is not intended to replace advice given to you by your health care provider. Make sure you discuss any questions you have with your health care provider. Document Released: 02/19/2016 Document Revised: 02/19/2016 Document Reviewed: 02/19/2016 Elsevier Interactive Patient Education  2018 [employer].  Health Risks of Smoking Smoking cigarettes is very bad for your health. Tobacco smoke has over 200 known poisons in it. It contains the poisonous gases nitrogen oxide and carbon monoxide. There are over 60 chemicals in tobacco smoke that cause cancer. Smoking is difficult to quit because a chemical in tobacco, called nicotine, causes addiction or dependence. When you smoke and inhale, nicotine is absorbed rapidly into the bloodstream through your lungs. Both inhaled and non-inhaled nicotine may be addictive. What are the risks of cigarette smoke? Cigarette smokers have an increased risk of many serious medical problems, including:  Lung cancer.  Lung disease, such as pneumonia, bronchitis, and emphysema.  Chest pain (angina) and heart attack because the heart  is not getting enough oxygen.  Heart disease and peripheral blood vessel disease.  High blood pressure (hypertension).  Stroke.  Oral cancer, including cancer of the lip, mouth, or voice box.  Bladder cancer.  Pancreatic cancer.  Cervical cancer.  Pregnancy complications, including premature birth.  Stillbirths and smaller newborn babies, birth defects, and genetic damage to sperm.  Early menopause.  Lower estrogen level for women.  Infertility.  Facial wrinkles.  Blindness.  Increased risk of broken bones (fractures).  Senile dementia.  Stomach ulcers and internal bleeding.  Delayed wound healing and increased risk of complications during surgery.  Even smoking lightly shortens your life expectancy by several years.  Because of secondhand smoke exposure, children of smokers have an increased risk of the following:  Sudden infant death syndrome (SIDS).  Respiratory infections.  Lung cancer.  Heart disease.  Ear infections.  What are the benefits of quitting? There are many health benefits of quitting smoking. Here are some of them:  Within days of quitting smoking, your risk of having a heart attack decreases, your blood flow improves, and your lung capacity improves. Blood pressure, pulse rate, and breathing patterns start returning  to normal soon after quitting.  Within months, your lungs may clear up completely.  Quitting for 10 years reduces your risk of developing lung cancer and heart disease to almost that of a nonsmoker.  People who quit may see an improvement in their overall quality of life.  How do I quit smoking? Smoking is an addiction with both physical and psychological effects, and longtime habits can be hard to change. Your health care provider can recommend:  Programs and community resources, which may include group support, education, or talk therapy.  Prescription medicines to help reduce cravings.  Nicotine replacement  products, such as patches, gum, and nasal sprays. Use these products only as directed. Do not replace cigarette smoking with electronic cigarettes, which are commonly called e-cigarettes. The safety of e-cigarettes is not known, and some may contain harmful chemicals.  A combination of two or more of these methods.  Where to find more information:  American Lung Association: www.lung.org  American Cancer Society: www.cancer.org Summary  Smoking cigarettes is very bad for your health. Cigarette smokers have an increased risk of many serious medical problems, including several cancers, heart disease, and stroke.  Smoking is an addiction with both physical and psychological effects, and longtime habits can be hard to change.  By stopping right away, you can greatly reduce the risk of medical problems for you and your family.  To help you quit smoking, your health care provider can recommend programs, community resources, prescription medicines, and nicotine replacement products such as patches, gum, and nasal sprays. This information is not intended to replace advice given to you by your health care provider. Make sure you discuss any questions you have with your health care provider. Document Released: 03/31/2004 Document Revised: 02/26/2016 Document Reviewed: 02/26/2016 Elsevier Interactive Patient Education  2017 [employer]  Steps to Quit Smoking Smoking tobacco can be bad for your health. It can also affect almost every organ in your body. Smoking puts you and people around you at risk for many serious long-lasting (chronic) diseases. Quitting smoking is hard, but it is one of the best things that you can do for your health. It is never too late to quit. What are the benefits of quitting smoking? When you quit smoking, you lower your risk for getting serious diseases and conditions. They can include:  Lung cancer or lung disease.  Heart disease.  Stroke.  Heart attack.  Not  being able to have children (infertility).  Weak bones (osteoporosis) and broken bones (fractures).  If you have coughing, wheezing, and shortness of breath, those symptoms may get better when you quit. You may also get sick less often. If you are pregnant, quitting smoking can help to lower your chances of having a baby of low birth weight. What can I do to help me quit smoking? Talk with your doctor about what can help you quit smoking. Some things you can do (strategies) include:  Quitting smoking totally, instead of slowly cutting back how much you smoke over a period of time.  Going to in-person counseling. You are more likely to quit if you go to many counseling sessions.  Using resources and support systems, such as: ? Agricultural engineernline chats with a Veterinary surgeoncounselor. ? Phone quitlines. ? Automotive engineerrinted self-help materials. ? Support groups or group counseling. ? Text messaging programs. ? Mobile phone apps or applications.  Taking medicines. Some of these medicines may have nicotine in them. If you are pregnant or breastfeeding, do not take any medicines to quit smoking  unless your doctor says it is okay. Talk with your doctor about counseling or other things that can help you.  Talk with your doctor about using more than one strategy at the same time, such as taking medicines while you are also going to in-person counseling. This can help make quitting easier. What things can I do to make it easier to quit? Quitting smoking might feel very hard at first, but there is a lot that you can do to make it easier. Take these steps:  Talk to your family and friends. Ask them to support and encourage you.  Call phone quitlines, reach out to support groups, or work with a Veterinary surgeon.  Ask people who smoke to not smoke around you.  Avoid places that make you want (trigger) to smoke, such as: ? Bars. ? Parties. ? Smoke-break areas at work.  Spend time with people who do not smoke.  Lower the stress in your  life. Stress can make you want to smoke. Try these things to help your stress: ? Getting regular exercise. ? Deep-breathing exercises. ? Yoga. ? Meditating. ? Doing a body scan. To do this, close your eyes, focus on one area of your body at a time from head to toe, and notice which parts of your body are tense. Try to relax the muscles in those areas.  Download or buy apps on your mobile phone or tablet that can help you stick to your quit plan. There are many free apps, such as QuitGuide from the [employer] developer for Disease Control and Prevention). You can find more support from smokefree.gov and other websites.  This information is not intended to replace advice given to you by your health care provider. Make sure you discuss any questions you have with your health care provider. Document Released: 12/18/2008 Document Revised: 10/20/2015 Document Reviewed: 07/08/2014 Elsevier Interactive Patient Education  2018 [employer].

## 2017-06-27 NOTE — Progress Notes (Signed)
Assessment & Plan:  Austin Beasley was seen today for follow-up and medication refill.  Diagnoses and all orders for this visit:  Type 2 diabetes mellitus with hyperglycemia, without long-term current use of insulin (HCC) -     Glucose (CBG) -     glucose blood (TRUE METRIX BLOOD GLUCOSE TEST) test strip; Use as instructed Continue blood sugar control as discussed in office today, low carbohydrate diet, and regular physical exercise as tolerated, 150 minutes per week (30 min each day, 5 days per week, or 50 min 3 days per week). Keep blood sugar logs with fasting goal of 80-130 mg/dl, post prandial less than 180.  For Hypoglycemia: BS <60 and Hyperglycemia BS >400; contact the clinic ASAP. Annual eye exams and foot exams are recommended.   Tobacco dependence Tyrann was counseled on the dangers of tobacco use, and was advised to quit. Reviewed strategies to maximize success, including removing cigarettes and smoking materials from environment, stress management and support of family/friends as well as pharmacological alternatives including: Wellbutrin, Chantix, Nicotine patch, Nicotine gum or lozenges. Smoking cessation support: smoking cessation hotline: 1-800-QUIT-NOW.  Smoking cessation classes are also available through [facility]. Call 3160490746 or visit our website at [email or web address]   A total of 4 minutes was spent on counseling for smoking cessation and [name] is not ready to quit.   Patient has been counseled on age-appropriate routine health concerns for screening and prevention. These are reviewed and up-to-date. Referrals have been placed accordingly. Immunizations are up-to-date or declined.    Subjective:   Chief Complaint  Patient presents with  . Follow-up    Pt. is here for a follow-up on diabetes and obesity.   . Medication Refill    Pt. need test strip refill.    [name] 37 y.o. male presents to office today for follow  up to diabetes.   Diabetes Mellitus Type 2 Recently diagnosed 2 months ago with diabetes mellitus type 2. We discussed diabetes nutrition and management at his last office a few with me several weeks ago. I had him return to the office today to check his glucometer, weight and evaluate his overall management of his diabetes. He is doing very well. He has lost 22lbs!!! He is checking his blood sugars BID. He has his meter with him today. 7 day average 98; 14 day average 104; 21 day average 107. He endorses medication tolerance. Denies any hypo or hyperglycemic symptoms.    Tobacco Dependence Smokes half a ppd. He is not ready to quit at this time.   Review of Systems  Constitutional: Negative for fever, malaise/fatigue and weight loss.  HENT: Negative.  Negative for nosebleeds.   Eyes: Negative.  Negative for blurred vision, double vision and photophobia.  Respiratory: Negative.  Negative for cough and shortness of breath.   Cardiovascular: Negative.  Negative for chest pain, palpitations and leg swelling.  Gastrointestinal: Negative.  Negative for heartburn, nausea and vomiting.  Musculoskeletal: Negative.  Negative for myalgias.  Neurological: Negative.  Negative for dizziness, focal weakness, seizures and headaches.  Psychiatric/Behavioral: Negative.  Negative for suicidal ideas.    Past Medical History:  Diagnosis Date  . Diabetes mellitus without complication ([place])   . Hypertension   . Obesity   . Thyroid disease     Past Surgical History:  Procedure Laterality Date  . ELEVATION OF DEPRESSED SKULL FRACTURE     age 58    Family History  Problem Relation Age of  Onset  . Diabetes Mother   . Cancer Mother   . Hypertension Father   . Cancer Sister   . Birth defects Maternal Grandmother   . Birth defects Maternal Grandfather   . Birth defects Paternal Grandmother   . Birth defects Paternal Grandfather     Social History Reviewed with no changes to be made today.    Outpatient Medications Prior to Visit  Medication Sig Dispense Refill  . atenolol (TENORMIN) 50 MG tablet Take 1 tablet (50 mg total) by mouth daily. 30 tablet 3  . Blood Glucose Monitoring Suppl (TRUE METRIX METER) DEVI 1 kit by Does not apply route 4 (four) times daily. 1 Device 0  . gabapentin (NEURONTIN) 100 MG capsule Take 1 capsule (100 mg total) by mouth 3 (three) times daily. 90 capsule 3  . levothyroxine (SYNTHROID, LEVOTHROID) 200 MCG tablet Take 1 tablet (200 mcg total) by mouth daily before breakfast. 30 tablet 3  . metFORMIN (GLUCOPHAGE) 850 MG tablet Take 1 tablet (850 mg total) by mouth 2 (two) times daily with a meal. 60 tablet 2  . ranitidine (ZANTAC) 150 MG tablet Take 150 mg by mouth daily as needed for heartburn.     . TRUEPLUS LANCETS 28G MISC 28 g by Does not apply route QID. 120 each 3  . glucose blood (TRUE METRIX BLOOD GLUCOSE TEST) test strip Use as instructed 100 each 12  . clotrimazole (LOTRIMIN) 1 % cream Apply to affected area 2 times daily (Patient not taking: Reported on 05/23/2017) 15 g 0   No facility-administered medications prior to visit.     No Known Allergies     Objective:    BP 123/76 (BP Location: Left Arm, Patient Position: Sitting, Cuff Size: Large)   Pulse 63   Temp 98.2 F (36.8 C) (Oral)   Ht '6\' 1"'  (1.854 m)   Wt (!) 370 lb (167.8 kg)   SpO2 96%   BMI 48.82 kg/m  Wt Readings from Last 3 Encounters:  06/27/17 (!) 370 lb (167.8 kg)  05/23/17 (!) 383 lb (173.7 kg)  05/03/17 (!) 392 lb 9.6 oz (178.1 kg)    Physical Exam  Constitutional: He is oriented to person, place, and time. He appears well-developed and well-nourished. He is cooperative.  HENT:  Head: Normocephalic and atraumatic.  Eyes: EOM are normal.  Neck: Normal range of motion.  Cardiovascular: Normal rate, regular rhythm and normal heart sounds. Exam reveals no gallop and no friction rub.  No murmur heard. Pulmonary/Chest: Effort normal and breath sounds normal. No  tachypnea. No respiratory distress. He has no decreased breath sounds. He has no wheezes. He has no rhonchi. He has no rales. He exhibits no tenderness.  Abdominal: Soft. Bowel sounds are normal.  Musculoskeletal: Normal range of motion. He exhibits no edema.  Neurological: He is alert and oriented to person, place, and time. Coordination normal.  Skin: Skin is warm and dry.  Psychiatric: He has a normal mood and affect. His behavior is normal. Judgment and thought content normal.  Nursing note and vitals reviewed.        Patient has been counseled extensively about nutrition and exercise as well as the importance of adherence with medications and regular follow-up. The patient was given clear instructions to go to ER or return to medical center if symptoms don't improve, worsen or new problems develop. The patient verbalized understanding.   Follow-up: Return in about 6 weeks (around 08/07/2017) for DM, TSH.   Gildardo Pounds, FNP-BC  [facility] [place], [place]   06/27/2017, 9:45 PM

## 2017-07-04 ENCOUNTER — Ambulatory Visit: Payer: Self-pay | Admitting: Registered"

## 2017-08-01 ENCOUNTER — Other Ambulatory Visit: Payer: Self-pay | Admitting: Nurse Practitioner

## 2017-08-03 MED FILL — GABAPENTIN 100 MG CAPSULE: 100 | 30 days supply | Qty: 90 | Fill #0

## 2017-08-09 MED FILL — TRUE METRIX TEST STRIP: 30 days supply | Qty: 100 | Fill #0

## 2017-08-15 ENCOUNTER — Encounter: Payer: Self-pay | Admitting: Nurse Practitioner

## 2017-08-15 ENCOUNTER — Ambulatory Visit: Payer: Self-pay | Attending: Nurse Practitioner | Admitting: Nurse Practitioner

## 2017-08-15 VITALS — BP 126/84 | HR 61 | Temp 98.3°F | Ht 73.0 in | Wt 357.0 lb

## 2017-08-15 DIAGNOSIS — E1165 Type 2 diabetes mellitus with hyperglycemia: Secondary | ICD-10-CM | POA: Insufficient documentation

## 2017-08-15 DIAGNOSIS — Z809 Family history of malignant neoplasm, unspecified: Secondary | ICD-10-CM | POA: Insufficient documentation

## 2017-08-15 DIAGNOSIS — Z09 Encounter for follow-up examination after completed treatment for conditions other than malignant neoplasm: Secondary | ICD-10-CM | POA: Insufficient documentation

## 2017-08-15 DIAGNOSIS — M545 Low back pain: Secondary | ICD-10-CM | POA: Insufficient documentation

## 2017-08-15 DIAGNOSIS — G8929 Other chronic pain: Secondary | ICD-10-CM | POA: Insufficient documentation

## 2017-08-15 DIAGNOSIS — E669 Obesity, unspecified: Secondary | ICD-10-CM | POA: Insufficient documentation

## 2017-08-15 DIAGNOSIS — Z8249 Family history of ischemic heart disease and other diseases of the circulatory system: Secondary | ICD-10-CM | POA: Insufficient documentation

## 2017-08-15 DIAGNOSIS — Z79899 Other long term (current) drug therapy: Secondary | ICD-10-CM | POA: Insufficient documentation

## 2017-08-15 DIAGNOSIS — E038 Other specified hypothyroidism: Secondary | ICD-10-CM | POA: Insufficient documentation

## 2017-08-15 DIAGNOSIS — E11649 Type 2 diabetes mellitus with hypoglycemia without coma: Secondary | ICD-10-CM | POA: Insufficient documentation

## 2017-08-15 DIAGNOSIS — Z7984 Long term (current) use of oral hypoglycemic drugs: Secondary | ICD-10-CM | POA: Insufficient documentation

## 2017-08-15 DIAGNOSIS — Z9889 Other specified postprocedural states: Secondary | ICD-10-CM | POA: Insufficient documentation

## 2017-08-15 DIAGNOSIS — Z8279 Family history of other congenital malformations, deformations and chromosomal abnormalities: Secondary | ICD-10-CM | POA: Insufficient documentation

## 2017-08-15 DIAGNOSIS — Z87898 Personal history of other specified conditions: Secondary | ICD-10-CM

## 2017-08-15 DIAGNOSIS — Z6841 Body Mass Index (BMI) 40.0 and over, adult: Secondary | ICD-10-CM | POA: Insufficient documentation

## 2017-08-15 DIAGNOSIS — Z79891 Long term (current) use of opiate analgesic: Secondary | ICD-10-CM | POA: Insufficient documentation

## 2017-08-15 LAB — POCT URINALYSIS DIPSTICK
Bilirubin, UA: NEGATIVE
Blood, UA: NEGATIVE
Glucose, UA: NEGATIVE
Leukocytes, UA: NEGATIVE
Nitrite, UA: NEGATIVE
Protein, UA: NEGATIVE
Spec Grav, UA: 1.025 (ref 1.010–1.025)
Urobilinogen, UA: 0.2 E.U./dL
pH, UA: 5.5 (ref 5.0–8.0)

## 2017-08-15 LAB — GLUCOSE, POCT (MANUAL RESULT ENTRY): POC Glucose: 100 mg/dl — AB (ref 70–99)

## 2017-08-15 LAB — POCT GLYCOSYLATED HEMOGLOBIN (HGB A1C): Hemoglobin A1C: 6.1 % — AB (ref 4.0–5.6)

## 2017-08-15 MED ORDER — METFORMIN HCL 850 MG PO TABS: 850.0000 mg | ORAL_TABLET | Freq: Every day | ORAL | 1 refills | Status: DC

## 2017-08-15 MED ORDER — CYCLOBENZAPRINE HCL 10 MG PO TABS: 10.0000 mg | ORAL_TABLET | Freq: Three times a day (TID) | ORAL | 1 refills | Status: DC | PRN

## 2017-08-15 MED ORDER — NAPROXEN 500 MG PO TABS: 500.0000 mg | ORAL_TABLET | Freq: Two times a day (BID) | ORAL | 1 refills | Status: DC

## 2017-08-15 MED ORDER — LEVOTHYROXINE SODIUM 200 MCG PO TABS: 200.0000 ug | ORAL_TABLET | Freq: Every day | ORAL | 3 refills | Status: DC

## 2017-08-15 MED ORDER — GABAPENTIN 300 MG PO CAPS: 300.0000 mg | ORAL_CAPSULE | Freq: Three times a day (TID) | ORAL | 3 refills | Status: DC

## 2017-08-15 MED ORDER — ATENOLOL 50 MG PO TABS: 50.0000 mg | ORAL_TABLET | Freq: Every day | ORAL | 3 refills | Status: DC

## 2017-08-15 MED FILL — CYCLOBENZAPRINE 10 MG TAB: 10 | 10 days supply | Qty: 30 | Fill #0

## 2017-08-15 MED FILL — GABAPENTIN 300 MG CAPSULE: 300 | 30 days supply | Qty: 90 | Fill #0

## 2017-08-15 MED FILL — ATENOLOL 50 MG TABLET: 50 | 30 days supply | Qty: 30 | Fill #0

## 2017-08-15 NOTE — Progress Notes (Signed)
Assessment & Plan:  Austin Beasley was seen today for follow-up and back pain.  Diagnoses and all orders for this visit:  Type 2 diabetes mellitus with hyperglycemia, without long-term current use of insulin (HCC) -     Glucose (CBG) -     HgB A1c -     metFORMIN (GLUCOPHAGE) 850 MG tablet; Take 1 tablet (850 mg total) by mouth daily with breakfast. -     gabapentin (NEURONTIN) 300 MG capsule; Take 1 capsule (300 mg total) by mouth 3 (three) times daily. -     Microalbumin / creatinine urine ratio Continue blood sugar control as discussed in office today, low carbohydrate diet, and regular physical exercise as tolerated, 150 minutes per week (30 min each day, 5 days per week, or 50 min 3 days per week). Keep blood sugar logs with fasting goal of 80-130 mg/dl, post prandial less than 180.  For Hypoglycemia: BS <60 and Hyperglycemia BS >400; contact the clinic ASAP. Annual eye exams and foot exams are recommended.   History of palpitations -     atenolol (TENORMIN) 50 MG tablet; Take 1 tablet (50 mg total) by mouth daily.  Other specified hypothyroidism CHRONIC. Well controlled -     levothyroxine (SYNTHROID, LEVOTHROID) 200 MCG tablet; Take 1 tablet (200 mcg total) by mouth daily before breakfast.  Chronic bilateral low back pain without sciatica -     cyclobenzaprine (FLEXERIL) 10 MG tablet; Take 1 tablet (10 mg total) by mouth 3 (three) times daily as needed for muscle spasms. -     Urinalysis Dipstick -     naproxen (NAPROSYN) 500 MG tablet; Take 1 tablet (500 mg total) by mouth 2 (two) times daily with a meal. Work on losing weight to help reduce back pain. May alternate with heat and ice application for pain relief. May also alternate with acetaminophen as prescribed for back pain. Other alternatives include massage, acupuncture and water aerobics.  You must stay active and avoid a sedentary lifestyle.   Patient has been counseled on age-appropriate routine health concerns for  screening and prevention. These are reviewed and up-to-date. Referrals have been placed accordingly. Immunizations are up-to-date or declined.    Subjective:   Chief Complaint  Patient presents with  . Follow-up    Pt. is here for a follow up on diabetes.   . Back Pain    Pt. stated he is having kidney soreness and back pain.    HPI Austin Beasley 37 y.o. male presents to office today for follow up to DM. HE HAS LOST 26 LBS!!!!! He endorses flank pain today.   DM Type 2 New onset several months ago. He is doing well with weight loss and A1c is down to 6.1 from  13.2!!!!! He has his meter with him today. Readings below:  7 Day Average 97  14 Day Average 97 30 Day Average 101 Checking blood sugars at least twice a day.  Denies any hypoglycemic symptoms. Endorses neuropathy of both feet. Will decrease metformin from 859m BID to QD. He is overdue for eye exam.  Lab Results  Component Value Date   HGBA1C 6.1 (A) 08/15/2017   Lab Results  Component Value Date   HGBA1C 13.2 05/03/2017    Low Back Pain Chronic however worsening over the past few weeks. Aggravating factors: lying down or sitting for prolonged periods of time. Relieving factors: being active or moving. Duration of pain from minutes to an hour maximum. Pain is described as aching  and sharp. Does not radiate.    Review of Systems  Constitutional: Negative for fever, malaise/fatigue and weight loss.  HENT: Negative.  Negative for nosebleeds.   Eyes: Negative.  Negative for blurred vision, double vision and photophobia.  Respiratory: Negative.  Negative for cough and shortness of breath.   Cardiovascular: Negative.  Negative for chest pain, palpitations and leg swelling.  Gastrointestinal: Negative.  Negative for blood in stool, constipation, diarrhea, heartburn, melena, nausea and vomiting.  Genitourinary: Negative for dysuria and flank pain.  Musculoskeletal: Positive for back pain and myalgias.  Neurological:  Positive for sensory change. Negative for dizziness, focal weakness, seizures and headaches.  Psychiatric/Behavioral: Negative.  Negative for suicidal ideas.    Past Medical History:  Diagnosis Date  . Diabetes mellitus without complication ([place])   . Obesity   . Palpitations   . Thyroid disease     Past Surgical History:  Procedure Laterality Date  . ELEVATION OF DEPRESSED SKULL FRACTURE     age 15    Family History  Problem Relation Age of Onset  . Diabetes Mother   . Cancer Mother   . Hypertension Father   . Cancer Sister   . Birth defects Maternal Grandmother   . Birth defects Maternal Grandfather   . Birth defects Paternal Grandmother   . Birth defects Paternal Grandfather     Social History Reviewed with no changes to be made today.   Outpatient Medications Prior to Visit  Medication Sig Dispense Refill  . Blood Glucose Monitoring Suppl (TRUE METRIX METER) DEVI 1 kit by Does not apply route 4 (four) times daily. 1 Device 0  . glucose blood (TRUE METRIX BLOOD GLUCOSE TEST) test strip Use as instructed 100 each 12  . ranitidine (ZANTAC) 150 MG tablet Take 150 mg by mouth daily as needed for heartburn.     . TRUEPLUS LANCETS 28G MISC 28 g by Does not apply route QID. 120 each 3  . atenolol (TENORMIN) 50 MG tablet Take 1 tablet (50 mg total) by mouth daily. 30 tablet 3  . gabapentin (NEURONTIN) 100 MG capsule TAKE 1 CAPSULE BY MOUTH 3 TIMES A DAY 90 capsule 0  . levothyroxine (SYNTHROID, LEVOTHROID) 200 MCG tablet Take 1 tablet (200 mcg total) by mouth daily before breakfast. 30 tablet 3  . metFORMIN (GLUCOPHAGE) 850 MG tablet Take 1 tablet (850 mg total) by mouth 2 (two) times daily with a meal. 60 tablet 2  . clotrimazole (LOTRIMIN) 1 % cream Apply to affected area 2 times daily (Patient not taking: Reported on 05/23/2017) 15 g 0   No facility-administered medications prior to visit.     No Known Allergies     Objective:    BP 126/84 (BP Location: Left Arm,  Patient Position: Sitting, Cuff Size: Large)   Pulse 61   Temp 98.3 F (36.8 C) (Oral)   Ht '6\' 1"'  (1.854 m)   Wt (!) 357 lb (161.9 kg)   SpO2 97%   BMI 47.10 kg/m  Wt Readings from Last 3 Encounters:  08/15/17 (!) 357 lb (161.9 kg)  06/27/17 (!) 370 lb (167.8 kg)  05/23/17 (!) 383 lb (173.7 kg)    Physical Exam  Constitutional: He is oriented to person, place, and time. He appears well-developed and well-nourished. He is cooperative.  HENT:  Head: Normocephalic and atraumatic.  Eyes: EOM are normal.  Neck: Normal range of motion.  Cardiovascular: Normal rate and normal heart sounds. Exam reveals no gallop and no friction rub.  No murmur heard. Pulmonary/Chest: Effort normal and breath sounds normal. No tachypnea. No respiratory distress. He has no decreased breath sounds. He has no wheezes. He has no rhonchi. He has no rales. He exhibits no tenderness.  Abdominal: Soft. Bowel sounds are normal.  Musculoskeletal: Normal range of motion. He exhibits no edema.       Lumbar back: He exhibits pain. He exhibits normal range of motion, no tenderness, no bony tenderness, no swelling and no edema.  Neurological: He is alert and oriented to person, place, and time. Coordination normal.  Skin: Skin is warm and dry.  Psychiatric: He has a normal mood and affect. His behavior is normal. Judgment and thought content normal.  Nursing note and vitals reviewed.      Patient has been counseled extensively about nutrition and exercise as well as the importance of adherence with medications and regular follow-up. The patient was given clear instructions to go to ER or return to medical center if symptoms don't improve, worsen or new problems develop. The patient verbalized understanding.   Follow-up: Return in about 3 months (around 11/15/2017) for DM/EKG/TSH/FOOT [place], FNP-BC [facility] and [place], [place]   08/15/2017, 2:45 PM

## 2017-08-15 NOTE — Patient Instructions (Signed)

## 2017-08-16 LAB — MICROALBUMIN / CREATININE URINE RATIO
Creatinine, Urine: 203.8 mg/dL
Microalb/Creat Ratio: 2.1 mg/g creat (ref 0.0–30.0)
Microalbumin, Urine: 4.2 ug/mL

## 2017-09-25 MED FILL — metFORMIN HCL 850 MG TABS: 850 | 90 days supply | Qty: 90 | Fill #0

## 2017-09-25 MED FILL — ATENOLOL 50 MG TABLET: 50 | 90 days supply | Qty: 90 | Fill #1

## 2017-09-25 MED FILL — LEVOTHYROXINE 200 MCG TAB: 200 | 90 days supply | Qty: 90 | Fill #0

## 2017-09-25 MED FILL — TRUE METRIX TEST STRIP: 30 days supply | Qty: 100 | Fill #1

## 2017-11-20 ENCOUNTER — Ambulatory Visit: Payer: Self-pay | Attending: Nurse Practitioner | Admitting: Nurse Practitioner

## 2017-11-20 ENCOUNTER — Other Ambulatory Visit: Payer: Self-pay

## 2017-11-20 ENCOUNTER — Encounter: Payer: Self-pay | Admitting: Nurse Practitioner

## 2017-11-20 VITALS — BP 118/77 | HR 60 | Temp 98.3°F | Ht 73.0 in | Wt 347.4 lb

## 2017-11-20 DIAGNOSIS — E669 Obesity, unspecified: Secondary | ICD-10-CM | POA: Insufficient documentation

## 2017-11-20 DIAGNOSIS — E038 Other specified hypothyroidism: Secondary | ICD-10-CM

## 2017-11-20 DIAGNOSIS — Z87898 Personal history of other specified conditions: Secondary | ICD-10-CM

## 2017-11-20 DIAGNOSIS — Z7984 Long term (current) use of oral hypoglycemic drugs: Secondary | ICD-10-CM | POA: Insufficient documentation

## 2017-11-20 DIAGNOSIS — E039 Hypothyroidism, unspecified: Secondary | ICD-10-CM | POA: Insufficient documentation

## 2017-11-20 DIAGNOSIS — R002 Palpitations: Secondary | ICD-10-CM | POA: Insufficient documentation

## 2017-11-20 DIAGNOSIS — Z6841 Body Mass Index (BMI) 40.0 and over, adult: Secondary | ICD-10-CM | POA: Insufficient documentation

## 2017-11-20 DIAGNOSIS — E1140 Type 2 diabetes mellitus with diabetic neuropathy, unspecified: Secondary | ICD-10-CM | POA: Insufficient documentation

## 2017-11-20 DIAGNOSIS — E119 Type 2 diabetes mellitus without complications: Secondary | ICD-10-CM

## 2017-11-20 DIAGNOSIS — Z79890 Hormone replacement therapy (postmenopausal): Secondary | ICD-10-CM | POA: Insufficient documentation

## 2017-11-20 LAB — POCT GLYCOSYLATED HEMOGLOBIN (HGB A1C): Hemoglobin A1C: 5.4 % (ref 4.0–5.6)

## 2017-11-20 LAB — GLUCOSE, POCT (MANUAL RESULT ENTRY): POC Glucose: 97 mg/dl (ref 70–99)

## 2017-11-20 MED ORDER — LEVOTHYROXINE SODIUM 200 MCG PO TABS: 200.0000 ug | ORAL_TABLET | Freq: Every day | ORAL | 3 refills | Status: DC

## 2017-11-20 MED ORDER — METFORMIN HCL 850 MG PO TABS: 850.0000 mg | ORAL_TABLET | Freq: Every day | ORAL | 1 refills | Status: DC

## 2017-11-20 MED ORDER — GLUCOSE BLOOD VI STRP: ORAL_STRIP | 12 refills | Status: DC

## 2017-11-20 MED ORDER — ATENOLOL 50 MG PO TABS: 50.0000 mg | ORAL_TABLET | Freq: Every day | ORAL | 3 refills | Status: DC

## 2017-11-20 MED FILL — TRUE METRIX TEST STRIP: 25 days supply | Qty: 100 | Fill #0

## 2017-11-20 NOTE — Progress Notes (Signed)
Assessment & Plan:  Austin Beasley was seen today for follow-up.  Diagnoses and all orders for this visit:  Type 2 diabetes mellitus without complication, without long-term current use of insulin (HCC) -     Glucose (CBG) -     HgB A1c -     glucose blood (TRUE METRIX BLOOD GLUCOSE TEST) test strip; Use as instructed -     metFORMIN (GLUCOPHAGE) 850 MG tablet; Take 1 tablet (850 mg total) by mouth daily with breakfast. Continue blood sugar control as discussed in office today, low carbohydrate diet, and regular physical exercise as tolerated, 150 minutes per week (30 min each day, 5 days per week, or 50 min 3 days per week). Keep blood sugar logs with fasting goal of 90-130 mg/dl, post prandial (after you eat) less than 180.  For Hypoglycemia: BS <60 and Hyperglycemia BS >400; contact the clinic ASAP. Annual eye exams and foot exams are recommended.   History of palpitations -     atenolol (TENORMIN) 50 MG tablet; Take 1 tablet (50 mg total) by mouth daily.  Other specified hypothyroidism -     levothyroxine (SYNTHROID, LEVOTHROID) 200 MCG tablet; Take 1 tablet (200 mcg total) by mouth daily before breakfast.    Patient has been counseled on age-appropriate routine health concerns for screening and prevention. These are reviewed and up-to-date. Referrals have been placed accordingly. Immunizations are up-to-date or declined.    Subjective:   Chief Complaint  Patient presents with  . Follow-up    Pt. is here for diabetes follow-up.    HPI Austin Beasley 37 y.o. male presents to office today for follow up DM.  Type 2 Diabetes Mellitus Disease course has been are improving. There are no hypoglycemic symptoms. There are no hypoglycemic complications. Symptoms are stable. There are diabetic complications including neuropathy which is currently controlled. Risk factors for coronary artery disease include family history, dyslipidemia, diabetes mellitus, obesity, hypertension, sedentary  lifestyle and stress. Current diabetic treatment includes metformin 863m daily. Patient is compliant with treatment all of the time and monitors blood glucose at home 2 times per day.   Home blood glucose trend : (FBS 100-110smg/dl)  Weight is  Stable and he is making significant strides with weight loss!!!!. Patient follows a generally healthy diet. Meal planning includes avoidance of concentrated sweets. Patient has not seen a dietician. Patient is not compliant with exercise.   An ACE inhibitor/angiotensin II receptor blocker is being taken. Patient does not see a podiatrist. Eye exam is not current. Patient has been advised to apply for financial assistance and schedule to see our financial counselor.  He has his meter with him today:  7 day average 104 14 day average 104 30 day average 109  Palpitations He continues to endorse intermittent palpitations which cause slight chest discomfort. I was unable to capture any arrhythmias via EKG today. He may need to be evaluated by cardiology for possible event monitor however he is uninsured at this time. He currently is taking atenolol 551mdaily which seems to reduce the occurrence of the palpitations but does not provide complete resolution of symptoms.   Hypothyroidism Currently endorses medication compliance taking synthroid. He denies any hypo or hyperthyroid symptoms. Will recheck levels today as previous levels prior to April have been elevated.  Lab Results  Component Value Date   TSH 2.120 06/05/2017   Review of Systems  Constitutional: Negative for fever, malaise/fatigue and weight loss.  HENT: Negative.  Negative for nosebleeds.  Eyes: Negative.  Negative for blurred vision, double vision and photophobia.  Respiratory: Negative.  Negative for cough and shortness of breath.   Cardiovascular: Positive for palpitations. Negative for chest pain and leg swelling.  Gastrointestinal: Negative.  Negative for heartburn, nausea and vomiting.    Musculoskeletal: Negative.  Negative for myalgias.  Neurological: Positive for tingling (mild; bilateral feet). Negative for dizziness, focal weakness, seizures and headaches.  Psychiatric/Behavioral: Negative.  Negative for suicidal ideas.    Past Medical History:  Diagnosis Date  . Diabetes mellitus without complication ([place])   . Obesity   . Palpitations   . Thyroid disease     Past Surgical History:  Procedure Laterality Date  . ELEVATION OF DEPRESSED SKULL FRACTURE     age 12    Family History  Problem Relation Age of Onset  . Diabetes Mother   . Cancer Mother   . Hypertension Father   . Cancer Sister   . Birth defects Maternal Grandmother   . Birth defects Maternal Grandfather   . Birth defects Paternal Grandmother   . Birth defects Paternal Grandfather     Social History Reviewed with no changes to be made today.   Outpatient Medications Prior to Visit  Medication Sig Dispense Refill  . Blood Glucose Monitoring Suppl (TRUE METRIX METER) DEVI 1 kit by Does not apply route 4 (four) times daily. 1 Device 0  . cyclobenzaprine (FLEXERIL) 10 MG tablet Take 1 tablet (10 mg total) by mouth 3 (three) times daily as needed for muscle spasms. 30 tablet 1  . naproxen (NAPROSYN) 500 MG tablet Take 1 tablet (500 mg total) by mouth 2 (two) times daily with a meal. 60 tablet 1  . ranitidine (ZANTAC) 150 MG tablet Take 150 mg by mouth daily as needed for heartburn.     . TRUEPLUS LANCETS 28G MISC 28 g by Does not apply route QID. 120 each 3  . atenolol (TENORMIN) 50 MG tablet Take 1 tablet (50 mg total) by mouth daily. 30 tablet 3  . glucose blood (TRUE METRIX BLOOD GLUCOSE TEST) test strip Use as instructed 100 each 12  . levothyroxine (SYNTHROID, LEVOTHROID) 200 MCG tablet Take 1 tablet (200 mcg total) by mouth daily before breakfast. 30 tablet 3  . clotrimazole (LOTRIMIN) 1 % cream Apply to affected area 2 times daily (Patient not taking: Reported on 05/23/2017) 15 g 0  .  gabapentin (NEURONTIN) 300 MG capsule Take 1 capsule (300 mg total) by mouth 3 (three) times daily. 90 capsule 3  . metFORMIN (GLUCOPHAGE) 850 MG tablet Take 1 tablet (850 mg total) by mouth daily with breakfast. 90 tablet 1   No facility-administered medications prior to visit.     No Known Allergies     Objective:    BP 118/77 (BP Location: Left Arm, Patient Position: Sitting, Cuff Size: Large)   Pulse 60   Temp 98.3 F (36.8 C) (Oral)   Ht '6\' 1"'  (1.854 m)   Wt (!) 347 lb 6.4 oz (157.6 kg)   SpO2 97%   BMI 45.83 kg/m  Wt Readings from Last 3 Encounters:  11/20/17 (!) 347 lb 6.4 oz (157.6 kg)  08/15/17 (!) 357 lb (161.9 kg)  06/27/17 (!) 370 lb (167.8 kg)    Physical Exam  Constitutional: He is oriented to person, place, and time. He appears well-developed and well-nourished. He is cooperative.  HENT:  Head: Normocephalic and atraumatic.  Eyes: EOM are normal.  Neck: Normal range of motion.  Cardiovascular: Normal  rate, regular rhythm and normal heart sounds. Exam reveals no gallop and no friction rub.  No murmur heard. Pulses:      Dorsalis pedis pulses are 2+ on the right side, and 2+ on the left side.       Posterior tibial pulses are 2+ on the right side, and 2+ on the left side.  Pulmonary/Chest: Effort normal and breath sounds normal. No tachypnea. No respiratory distress. He has no decreased breath sounds. He has no wheezes. He has no rhonchi. He has no rales. He exhibits no tenderness.  Abdominal: Bowel sounds are normal.  Musculoskeletal: Normal range of motion. He exhibits no edema.  Feet:  Right Foot:  Protective Sensation: 10 sites tested. 10 sites sensed.  Skin Integrity: Positive for callus. Negative for skin breakdown or dry skin.  Left Foot:  Protective Sensation: 10 sites tested. 10 sites sensed.  Skin Integrity: Positive for callus. Negative for skin breakdown or dry skin.  Neurological: He is alert and oriented to person, place, and time.  Coordination normal.  Skin: Skin is warm and dry.  Psychiatric: He has a normal mood and affect. His behavior is normal. Judgment and thought content normal.  Nursing note and vitals reviewed.        Patient has been counseled extensively about nutrition and exercise as well as the importance of adherence with medications and regular follow-up. The patient was given clear instructions to go to ER or return to medical center if symptoms don't improve, worsen or new problems develop. The patient verbalized understanding.   Follow-up: No follow-ups on file.   Gildardo Pounds, FNP-BC [facility] and [facility] [place], [place]   11/20/2017, 10:28 AM

## 2017-11-21 ENCOUNTER — Other Ambulatory Visit: Payer: Self-pay | Admitting: Nurse Practitioner

## 2017-11-21 DIAGNOSIS — E038 Other specified hypothyroidism: Secondary | ICD-10-CM

## 2017-11-21 LAB — TSH: TSH: 0.143 u[IU]/mL — ABNORMAL LOW (ref 0.450–4.500)

## 2017-11-21 MED ORDER — LEVOTHYROXINE SODIUM 175 MCG PO TABS: 175.0000 ug | ORAL_TABLET | Freq: Every day | ORAL | 1 refills | Status: DC

## 2017-11-22 MED FILL — LEVOTHYROXINE 175 MCG TAB: 175 | 30 days supply | Qty: 30 | Fill #0

## 2017-12-21 MED FILL — metFORMIN HCL 850 MG TABS: 850 | 90 days supply | Qty: 90 | Fill #1

## 2017-12-21 MED FILL — LEVOTHYROXINE 200 MCG TAB: 200 | 30 days supply | Qty: 30 | Fill #1

## 2017-12-21 MED FILL — TRUE METRIX TEST STRIP: 30 days supply | Qty: 100 | Fill #2

## 2017-12-21 MED FILL — ATENOLOL 50 MG TABLET: 50 | 30 days supply | Qty: 30 | Fill #0

## 2018-01-23 MED FILL — LEVOTHYROXINE 200 MCG TAB: 200 | 30 days supply | Qty: 30 | Fill #0

## 2018-02-12 MED FILL — ATENOLOL 50 MG TABLET: 50 | 30 days supply | Qty: 30 | Fill #1

## 2018-02-13 MED FILL — LEVOTHYROXINE 200 MCG TAB: 200 | 30 days supply | Qty: 30 | Fill #1

## 2018-02-20 ENCOUNTER — Encounter: Payer: Self-pay | Admitting: Nurse Practitioner

## 2018-02-20 ENCOUNTER — Other Ambulatory Visit: Payer: Self-pay

## 2018-02-20 ENCOUNTER — Ambulatory Visit: Payer: Self-pay | Attending: Nurse Practitioner | Admitting: Nurse Practitioner

## 2018-02-20 VITALS — BP 128/77 | HR 58 | Temp 98.1°F | Ht 73.0 in | Wt 352.2 lb

## 2018-02-20 DIAGNOSIS — Z7984 Long term (current) use of oral hypoglycemic drugs: Secondary | ICD-10-CM | POA: Insufficient documentation

## 2018-02-20 DIAGNOSIS — E1165 Type 2 diabetes mellitus with hyperglycemia: Secondary | ICD-10-CM | POA: Insufficient documentation

## 2018-02-20 DIAGNOSIS — R001 Bradycardia, unspecified: Secondary | ICD-10-CM | POA: Insufficient documentation

## 2018-02-20 DIAGNOSIS — E1140 Type 2 diabetes mellitus with diabetic neuropathy, unspecified: Secondary | ICD-10-CM | POA: Insufficient documentation

## 2018-02-20 DIAGNOSIS — E038 Other specified hypothyroidism: Secondary | ICD-10-CM | POA: Insufficient documentation

## 2018-02-20 DIAGNOSIS — Z79899 Other long term (current) drug therapy: Secondary | ICD-10-CM | POA: Insufficient documentation

## 2018-02-20 DIAGNOSIS — Z23 Encounter for immunization: Secondary | ICD-10-CM | POA: Insufficient documentation

## 2018-02-20 LAB — POCT GLYCOSYLATED HEMOGLOBIN (HGB A1C): HbA1c, POC (controlled diabetic range): 5.8 % (ref 0.0–7.0)

## 2018-02-20 LAB — GLUCOSE, POCT (MANUAL RESULT ENTRY): POC Glucose: 117 mg/dl — AB (ref 70–99)

## 2018-02-20 MED ORDER — METFORMIN HCL 850 MG PO TABS: 850.0000 mg | ORAL_TABLET | Freq: Every day | ORAL | 1 refills | Status: DC

## 2018-02-20 MED ORDER — GABAPENTIN 300 MG PO CAPS: 300.0000 mg | ORAL_CAPSULE | Freq: Three times a day (TID) | ORAL | 3 refills | Status: DC

## 2018-02-20 NOTE — Progress Notes (Signed)
Assessment & Plan:  [name] was seen today for diabetes.  Diagnoses and all orders for this visit:  Type 2 diabetes mellitus with hyperglycemia, without long-term current use of insulin (HCC) -     Glucose (CBG) -     POCT glycosylated hemoglobin (Hb A1C) -     gabapentin (NEURONTIN) 300 MG capsule; Take 1 capsule (300 mg total) by mouth 3 (three) times daily. -     metFORMIN (GLUCOPHAGE) 850 MG tablet; Take 1 tablet (850 mg total) by mouth daily with breakfast. Continue blood sugar control as discussed in office today, low carbohydrate diet, and regular physical exercise as tolerated, 150 minutes per week (30 min each day, 5 days per week, or 50 min 3 days per week). Keep blood sugar logs with fasting goal of 90-130 mg/dl, post prandial (after you eat) less than 180.  For Hypoglycemia: BS <60 and Hyperglycemia BS >400; contact the clinic ASAP. Annual eye exams and foot exams are recommended.   Other specified hypothyroidism -     TSH  Need for immunization against influenza -     Flu Vaccine QUAD 36+ mos IM    Patient has been counseled on age-appropriate routine health concerns for screening and prevention. These are reviewed and up-to-date. Referrals have been placed accordingly. Immunizations are up-to-date or declined.    Subjective:   Chief Complaint  Patient presents with  . Diabetes   HPI Austin Beasley 37 y.o. male presents to office today for follow up to DM.    DM Type 2 Chronic and well controlled.  He is monitoring his glucose levels at home at least twice a day.  Current medication regimen includes metformin 850 mg daily.  Takes gabapentin 300 mg 3 times daily for peripheral neuropathy.  Does present with his glucometer today with average readings as follows: 7 day average 132 14 day average 123 30 day average  118 He currently denies any hyper or hypoglycemic symptoms.  He is overdue for eye exam. Patient has been advised to apply for financial assistance  and schedule to see our financial counselor.  He is agreeable to receiving the pneumococcal vaccination as well as the influenza vaccine today Lab Results  Component Value Date   HGBA1C 5.8 02/20/2018   Lab Results  Component Value Date   HGBA1C 5.4 11/20/2017   HYPOTHYROIDISM Patient is currently taking levothyroxine 175 mcg which was dose adjusted in September due to decreased TSH level.  He was instructed through my chart to make an appointment for 6 to 8 weeks which would have been sometime in October to recheck his TSH level.  Unfortunately he did not access his MyChart and will need to have a repeat TSH performed today.  Will refill levothyroxine based on new TSH level. Lab Results  Component Value Date   TSH 0.143 (L) 11/20/2017    Review of Systems  Constitutional: Negative for fever, malaise/fatigue and weight loss.  HENT: Negative.  Negative for nosebleeds.   Eyes: Negative.  Negative for blurred vision, double vision and photophobia.  Respiratory: Negative.  Negative for cough and shortness of breath.   Cardiovascular: Negative.  Negative for chest pain, palpitations and leg swelling.  Gastrointestinal: Negative.  Negative for heartburn, nausea and vomiting.  Musculoskeletal: Negative.  Negative for myalgias.  Neurological: Negative.  Negative for dizziness, focal weakness, seizures and headaches.  Psychiatric/Behavioral: Negative.  Negative for suicidal ideas.    Past Medical History:  Diagnosis Date  . Diabetes mellitus without  complication ([place])   . Obesity   . Palpitations   . Thyroid disease     Past Surgical History:  Procedure Laterality Date  . ELEVATION OF DEPRESSED SKULL FRACTURE     age 51    Family History  Problem Relation Age of Onset  . Diabetes Mother   . Cancer Mother   . Hypertension Father   . Cancer Sister   . Birth defects Maternal Grandmother   . Birth defects Maternal Grandfather   . Birth defects Paternal Grandmother   . Birth  defects Paternal Grandfather     Social History Reviewed with no changes to be made today.   Outpatient Medications Prior to Visit  Medication Sig Dispense Refill  . atenolol (TENORMIN) 50 MG tablet Take 1 tablet (50 mg total) by mouth daily. 90 tablet 3  . Blood Glucose Monitoring Suppl (TRUE METRIX METER) DEVI 1 kit by Does not apply route 4 (four) times daily. 1 Device 0  . cyclobenzaprine (FLEXERIL) 10 MG tablet Take 1 tablet (10 mg total) by mouth 3 (three) times daily as needed for muscle spasms. 30 tablet 1  . glucose blood (TRUE METRIX BLOOD GLUCOSE TEST) test strip Use as instructed 100 each 12  . ranitidine (ZANTAC) 150 MG tablet Take 150 mg by mouth daily as needed for heartburn.     . TRUEPLUS LANCETS 28G MISC 28 g by Does not apply route QID. 120 each 3  . clotrimazole (LOTRIMIN) 1 % cream Apply to affected area 2 times daily (Patient not taking: Reported on 02/20/2018) 15 g 0  . levothyroxine (SYNTHROID, LEVOTHROID) 175 MCG tablet Take 1 tablet (175 mcg total) by mouth daily before breakfast. 30 tablet 1  . naproxen (NAPROSYN) 500 MG tablet Take 1 tablet (500 mg total) by mouth 2 (two) times daily with a meal. (Patient not taking: Reported on 02/20/2018) 60 tablet 1  . gabapentin (NEURONTIN) 300 MG capsule Take 1 capsule (300 mg total) by mouth 3 (three) times daily. 90 capsule 3  . metFORMIN (GLUCOPHAGE) 850 MG tablet Take 1 tablet (850 mg total) by mouth daily with breakfast. 90 tablet 1   No facility-administered medications prior to visit.     No Known Allergies     Objective:    BP 128/77   Pulse (!) 58   Temp 98.1 F (36.7 C)   Ht _0  (1.854 m)   Wt (!) 352 lb 3.2 oz (159.8 kg)   SpO2 96%   BMI 46.47 kg/m  Wt Readings from Last 3 Encounters:  02/20/18 (!) 352 lb 3.2 oz (159.8 kg)  11/20/17 (!) 347 lb 6.4 oz (157.6 kg)  08/15/17 (!) 357 lb (161.9 kg)    Physical Exam Vitals signs and nursing note reviewed.  Constitutional:      Appearance: He is  well-developed.  HENT:     Head: Normocephalic and atraumatic.  Neck:     Musculoskeletal: Normal range of motion.  Cardiovascular:     Rate and Rhythm: Regular rhythm. Bradycardia present.     Heart sounds: Normal heart sounds. No murmur. No friction rub. No gallop.   Pulmonary:     Effort: Pulmonary effort is normal. No tachypnea or respiratory distress.     Breath sounds: Normal breath sounds. No decreased breath sounds, wheezing, rhonchi or rales.  Chest:     Chest wall: No tenderness.  Abdominal:     General: Bowel sounds are normal.     Palpations: Abdomen is soft.  Musculoskeletal:  Normal range of motion.  Skin:    General: Skin is warm and dry.  Neurological:     Mental Status: He is alert and oriented to person, place, and time.     Coordination: Coordination normal.  Psychiatric:        Behavior: Behavior normal. Behavior is cooperative.        Thought Content: Thought content normal.        Judgment: Judgment normal.          Patient has been counseled extensively about nutrition and exercise as well as the importance of adherence with medications and regular follow-up. The patient was given clear instructions to go to ER or return to medical center if symptoms don't improve, worsen or new problems develop. The patient verbalized understanding.   Follow-up: Return in about 6 months (around 08/22/2018) for DM.   Gildardo Pounds, FNP-BC [facility] and [place], [place]   02/20/2018, 1:36 PM

## 2018-02-21 LAB — TSH: TSH: 0.296 u[IU]/mL — ABNORMAL LOW (ref 0.450–4.500)

## 2018-02-22 ENCOUNTER — Other Ambulatory Visit: Payer: Self-pay | Admitting: Nurse Practitioner

## 2018-02-22 DIAGNOSIS — E038 Other specified hypothyroidism: Secondary | ICD-10-CM

## 2018-02-22 MED ORDER — LEVOTHYROXINE SODIUM 150 MCG PO TABS: 150.0000 ug | ORAL_TABLET | Freq: Every day | ORAL | 1 refills | Status: DC

## 2018-02-22 MED FILL — LEVOTHYROXINE 150 MCG TAB: 150 | 30 days supply | Qty: 30 | Fill #0

## 2018-03-01 ENCOUNTER — Telehealth: Payer: Self-pay | Admitting: *Deleted

## 2018-03-01 NOTE — Telephone Encounter (Signed)
-----   Message from Claiborne RiggZelda W Fleming, NP sent at 02/22/2018 12:29 AM EST ----- Thyroid level is low but has improved slightly compared to 3 months ago. I will send script for change of dosage to pharmacy. Please make a lab appointment for 6-8 weeks to retest

## 2018-03-01 NOTE — Telephone Encounter (Signed)
Patient verified DOB Patient is aware of TSH still being decreased but improved from 3 months ago and he is aware of new dosing script being sent to the pharmacy and a recheck being completed in 8 weeks.  No further questions.

## 2018-03-09 ENCOUNTER — Emergency Department (HOSPITAL_COMMUNITY): Payer: No Typology Code available for payment source

## 2018-03-09 ENCOUNTER — Encounter (HOSPITAL_COMMUNITY): Payer: Self-pay

## 2018-03-09 ENCOUNTER — Emergency Department (HOSPITAL_COMMUNITY)
Admission: EM | Admit: 2018-03-09 | Discharge: 2018-03-09 | Disposition: A | Discharge status: Home / Self Care | Payer: No Typology Code available for payment source | Attending: Emergency Medicine | Admitting: Emergency Medicine

## 2018-03-09 DIAGNOSIS — Z7984 Long term (current) use of oral hypoglycemic drugs: Secondary | ICD-10-CM | POA: Diagnosis not present

## 2018-03-09 DIAGNOSIS — Z041 Encounter for examination and observation following transport accident: Secondary | ICD-10-CM | POA: Insufficient documentation

## 2018-03-09 DIAGNOSIS — Z79899 Other long term (current) drug therapy: Secondary | ICD-10-CM | POA: Insufficient documentation

## 2018-03-09 DIAGNOSIS — E119 Type 2 diabetes mellitus without complications: Secondary | ICD-10-CM | POA: Diagnosis not present

## 2018-03-09 DIAGNOSIS — M25552 Pain in left hip: Secondary | ICD-10-CM | POA: Insufficient documentation

## 2018-03-09 DIAGNOSIS — F17210 Nicotine dependence, cigarettes, uncomplicated: Secondary | ICD-10-CM | POA: Diagnosis not present

## 2018-03-09 DIAGNOSIS — G8929 Other chronic pain: Secondary | ICD-10-CM

## 2018-03-09 DIAGNOSIS — M545 Low back pain: Secondary | ICD-10-CM

## 2018-03-09 DIAGNOSIS — M25512 Pain in left shoulder: Secondary | ICD-10-CM

## 2018-03-09 MED ORDER — IBUPROFEN 200 MG PO TABS
600.0000 mg | ORAL_TABLET | Freq: Once | ORAL | Status: AC
  Administered 2018-03-09: 600 mg via ORAL
  Filled 2018-03-09: qty 3

## 2018-03-09 MED ORDER — IBUPROFEN 600 MG PO TABS: 600.0000 mg | ORAL_TABLET | Freq: Four times a day (QID) | ORAL | 0 refills | Status: DC | PRN

## 2018-03-09 MED ORDER — CYCLOBENZAPRINE HCL 10 MG PO TABS: 10.0000 mg | ORAL_TABLET | Freq: Three times a day (TID) | ORAL | 0 refills | Status: DC | PRN
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
## 2018-03-09 NOTE — ED Notes (Signed)
Bed: [facility] Expected date:  Expected time:  Means of arrival:  Comments: 

## 2018-03-09 NOTE — ED Notes (Signed)
Signature pad unavailable at this time. Patient verbalizes understanding of discharge paperwork, prescriptions, and follow up information.
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
## 2018-03-09 NOTE — ED Notes (Signed)
Patient also c/o left hip pain.

## 2018-03-09 NOTE — Discharge Instructions (Signed)

## 2018-03-09 NOTE — ED Notes (Signed)
ED Provider at bedside. 

## 2018-03-13 MED FILL — ATENOLOL 50 MG TABLET: 50 | 30 days supply | Qty: 30 | Fill #2

## 2018-03-13 MED FILL — TRUE METRIX TEST STRIP: 30 days supply | Qty: 100 | Fill #3

## 2018-03-29 MED FILL — metFORMIN HCL 850 MG TABS: 850 | 90 days supply | Qty: 90 | Fill #0

## 2018-03-29 MED FILL — LEVOTHYROXINE 150 MCG TAB: 150 | 30 days supply | Qty: 30 | Fill #1

## 2018-04-03 ENCOUNTER — Ambulatory Visit: Payer: Self-pay | Admitting: Nurse Practitioner

## 2018-04-11 MED FILL — ATENOLOL 50 MG TABLET: 50 | 30 days supply | Qty: 30 | Fill #3

## 2018-04-26 ENCOUNTER — Other Ambulatory Visit: Payer: Self-pay | Admitting: Nurse Practitioner

## 2018-04-26 DIAGNOSIS — E038 Other specified hypothyroidism: Secondary | ICD-10-CM

## 2018-04-26 MED FILL — ATENOLOL 50 MG TABLET: 50 | 30 days supply | Qty: 30 | Fill #4

## 2018-04-27 MED FILL — LEVOTHYROXINE 150 MCG TAB: 150 | 30 days supply | Qty: 30 | Fill #0

## 2018-05-14 MED FILL — ATENOLOL 50 MG TABLET: 50 | 30 days supply | Qty: 30 | Fill #5

## 2018-05-22 ENCOUNTER — Other Ambulatory Visit: Payer: Self-pay | Admitting: Nurse Practitioner

## 2018-05-22 DIAGNOSIS — E038 Other specified hypothyroidism: Secondary | ICD-10-CM

## 2018-05-22 NOTE — Telephone Encounter (Signed)
Refill request

## 2018-05-30 ENCOUNTER — Other Ambulatory Visit: Payer: Self-pay | Admitting: Nurse Practitioner

## 2018-05-30 DIAGNOSIS — E038 Other specified hypothyroidism: Secondary | ICD-10-CM

## 2018-06-01 MED FILL — LEVOTHYROXINE 150 MCG TAB: 150 | 30 days supply | Qty: 30 | Fill #0

## 2018-06-04 MED FILL — TRUE METRIX TEST STRIP: 30 days supply | Qty: 100 | Fill #4

## 2018-06-04 MED FILL — ATENOLOL 50 MG TABLET: 50 | 30 days supply | Qty: 30 | Fill #6

## 2018-07-03 ENCOUNTER — Other Ambulatory Visit: Payer: Self-pay | Admitting: Nurse Practitioner

## 2018-07-03 DIAGNOSIS — E038 Other specified hypothyroidism: Secondary | ICD-10-CM

## 2018-07-03 MED FILL — ATENOLOL 50 MG TABLET: 50 | 30 days supply | Qty: 30 | Fill #7

## 2018-07-03 MED FILL — GABAPENTIN 300 MG CAPSULE: 300 | 30 days supply | Qty: 90 | Fill #1

## 2018-07-04 ENCOUNTER — Telehealth: Payer: Self-pay | Admitting: Nurse Practitioner

## 2018-07-04 DIAGNOSIS — E038 Other specified hypothyroidism: Secondary | ICD-10-CM

## 2018-07-04 NOTE — Telephone Encounter (Signed)
Patient scheduled a lab for TSH.  Will contact patient for lab results and Medication.

## 2018-07-05 ENCOUNTER — Ambulatory Visit: Payer: Self-pay | Attending: Family Medicine

## 2018-07-05 ENCOUNTER — Other Ambulatory Visit: Payer: Self-pay

## 2018-07-05 DIAGNOSIS — E038 Other specified hypothyroidism: Secondary | ICD-10-CM

## 2018-07-06 LAB — TSH: TSH: 18.6 u[IU]/mL — ABNORMAL HIGH (ref 0.450–4.500)

## 2018-07-08 ENCOUNTER — Other Ambulatory Visit: Payer: Self-pay | Admitting: Nurse Practitioner

## 2018-07-08 DIAGNOSIS — E038 Other specified hypothyroidism: Secondary | ICD-10-CM

## 2018-07-08 DIAGNOSIS — E119 Type 2 diabetes mellitus without complications: Secondary | ICD-10-CM

## 2018-07-08 DIAGNOSIS — Z87898 Personal history of other specified conditions: Secondary | ICD-10-CM

## 2018-07-08 DIAGNOSIS — E1165 Type 2 diabetes mellitus with hyperglycemia: Secondary | ICD-10-CM

## 2018-07-08 MED ORDER — LEVOTHYROXINE SODIUM 175 MCG PO TABS: 175.0000 ug | ORAL_TABLET | Freq: Every day | ORAL | 1 refills | Status: DC

## 2018-07-08 MED ORDER — GLUCOSE BLOOD VI STRP: ORAL_STRIP | 12 refills | Status: DC

## 2018-07-08 MED ORDER — METFORMIN HCL 850 MG PO TABS: 850.0000 mg | ORAL_TABLET | Freq: Every day | ORAL | 1 refills | Status: DC

## 2018-07-08 MED ORDER — ATENOLOL 50 MG PO TABS: 50.0000 mg | ORAL_TABLET | Freq: Every day | ORAL | 3 refills | Status: DC

## 2018-07-08 MED ORDER — TRUEPLUS LANCETS 28G MISC: 28.0000 g | Freq: Four times a day (QID) | 3 refills | Status: DC

## 2018-07-08 MED ORDER — GABAPENTIN 300 MG PO CAPS: 300.0000 mg | ORAL_CAPSULE | Freq: Three times a day (TID) | ORAL | 3 refills | Status: DC

## 2018-07-09 NOTE — Telephone Encounter (Signed)
CMA attempt to reach patient to inform on results.  No answer and left a VM for a call back.  

## 2018-07-10 ENCOUNTER — Other Ambulatory Visit: Payer: Self-pay

## 2018-07-10 ENCOUNTER — Ambulatory Visit: Payer: Self-pay | Attending: Family Medicine

## 2018-07-10 ENCOUNTER — Other Ambulatory Visit: Payer: Self-pay | Admitting: Nurse Practitioner

## 2018-07-10 DIAGNOSIS — E1165 Type 2 diabetes mellitus with hyperglycemia: Secondary | ICD-10-CM

## 2018-07-10 DIAGNOSIS — Z87898 Personal history of other specified conditions: Secondary | ICD-10-CM

## 2018-07-10 DIAGNOSIS — E038 Other specified hypothyroidism: Secondary | ICD-10-CM

## 2018-07-10 MED ORDER — LEVOTHYROXINE SODIUM 175 MCG PO TABS: 175.0000 ug | ORAL_TABLET | Freq: Every day | ORAL | 1 refills | Status: DC

## 2018-07-10 MED ORDER — METFORMIN HCL 850 MG PO TABS: 850.0000 mg | ORAL_TABLET | Freq: Every day | ORAL | 1 refills | Status: DC

## 2018-07-10 MED ORDER — TRUEPLUS LANCETS 28G MISC: 28.0000 g | Freq: Four times a day (QID) | 3 refills | Status: DC

## 2018-07-10 MED ORDER — ATENOLOL 50 MG PO TABS: 50.0000 mg | ORAL_TABLET | Freq: Every day | ORAL | 3 refills | Status: DC

## 2018-07-10 MED ORDER — GABAPENTIN 300 MG PO CAPS: 300.0000 mg | ORAL_CAPSULE | Freq: Three times a day (TID) | ORAL | 3 refills | Status: DC

## 2018-07-10 MED ORDER — GLUCOSE BLOOD VI STRP: ORAL_STRIP | 12 refills | Status: DC

## 2018-07-10 MED FILL — LEVOTHYROXINE 175 MCG TAB: 175 | 30 days supply | Qty: 30 | Fill #0

## 2018-07-10 MED FILL — TRUE METRIX TEST STRIP: 25 days supply | Qty: 100 | Fill #0

## 2018-07-10 MED FILL — TRUEplus LANCETS 28G MISC: 30 days supply | Qty: 120 | Fill #0

## 2018-07-10 MED FILL — metFORMIN HCL 850 MG TABS: 850 | 30 days supply | Qty: 30 | Fill #0

## 2018-07-10 NOTE — Telephone Encounter (Signed)
-----   Message from Claiborne Rigg, NP sent at 07/08/2018  9:07 PM EDT ----- Thyroid levels are high. I will be sending in all your medications to the pharmacy and would like for you to make an appointment in 4 weeks for follow up.

## 2018-07-10 NOTE — Telephone Encounter (Signed)
CMA spoke to patient to inform on lab results and medication.  Patient verified DOB. Patient understood.   Pt. Was also informed to stop by the lab to get some blood work done.

## 2018-07-11 LAB — CMP14+EGFR
ALT: 18 IU/L (ref 0–44)
AST: 18 IU/L (ref 0–40)
Albumin/Globulin Ratio: 1.6 (ref 1.2–2.2)
Albumin: 4.4 g/dL (ref 4.0–5.0)
Alkaline Phosphatase: 68 IU/L (ref 39–117)
BUN/Creatinine Ratio: 11 (ref 9–20)
BUN: 13 mg/dL (ref 6–20)
Bilirubin Total: 0.3 mg/dL (ref 0.0–1.2)
CO2: 25 mmol/L (ref 20–29)
Calcium: 9.6 mg/dL (ref 8.7–10.2)
Chloride: 105 mmol/L (ref 96–106)
Creatinine, Ser: 1.16 mg/dL (ref 0.76–1.27)
GFR calc Af Amer: 92 mL/min/{1.73_m2} (ref >59)
GFR calc non Af Amer: 79 mL/min/{1.73_m2} (ref >59)
Globulin, Total: 2.8 g/dL (ref 1.5–4.5)
Glucose: 189 mg/dL — ABNORMAL HIGH (ref 65–99)
Potassium: 4.6 mmol/L (ref 3.5–5.2)
Sodium: 143 mmol/L (ref 134–144)
Total Protein: 7.2 g/dL (ref 6.0–8.5)

## 2018-07-11 LAB — LIPID PANEL
Chol/HDL Ratio: 3.1 ratio (ref 0.0–5.0)
Cholesterol, Total: 153 mg/dL (ref 100–199)
HDL: 50 mg/dL (ref >39)
LDL Calculated: 79 mg/dL (ref 0–99)
Triglycerides: 120 mg/dL (ref 0–149)
VLDL Cholesterol Cal: 24 mg/dL (ref 5–40)

## 2018-07-11 LAB — CBC
Hematocrit: 38.4 % (ref 37.5–51.0)
Hemoglobin: 13.4 g/dL (ref 13.0–17.7)
MCH: 32.1 pg (ref 26.6–33.0)
MCHC: 34.9 g/dL (ref 31.5–35.7)
MCV: 92 fL (ref 79–97)
Platelets: 288 10*3/uL (ref 150–450)
RBC: 4.17 x10E6/uL (ref 4.14–5.80)
RDW: 12.3 % (ref 11.6–15.4)
WBC: 7 10*3/uL (ref 3.4–10.8)

## 2018-07-11 LAB — HEMOGLOBIN A1C
Est. average glucose Bld gHb Est-mCnc: 128 mg/dL
Hgb A1c MFr Bld: 6.1 % — ABNORMAL HIGH (ref 4.8–5.6)

## 2018-07-13 ENCOUNTER — Telehealth: Payer: Self-pay

## 2018-07-13 NOTE — Telephone Encounter (Signed)
-----   Message from Claiborne Rigg, NP sent at 07/11/2018  4:48 PM EDT ----- Cholesterol levels look great! A1c still showing prediabetes. Up slightly from 5.4 to 6.1. Try to watch your carb intake as much as possible. Kidney and liver function are normal. Please let us know if you have been able to get any insurance or apply for the financial assistance.

## 2018-07-13 NOTE — Telephone Encounter (Signed)
CMA spoke to patient to inform on lab results and PCP advising.  Pt. Verified DOB.  Pt. Understood.

## 2018-08-06 MED FILL — LEVOTHYROXINE 175 MCG TAB: 175 | 30 days supply | Qty: 30 | Fill #1

## 2018-08-07 ENCOUNTER — Encounter: Payer: Self-pay | Admitting: Nurse Practitioner

## 2018-08-07 ENCOUNTER — Other Ambulatory Visit: Payer: Self-pay

## 2018-08-07 ENCOUNTER — Ambulatory Visit: Payer: Self-pay | Attending: Nurse Practitioner | Admitting: Nurse Practitioner

## 2018-08-07 DIAGNOSIS — K219 Gastro-esophageal reflux disease without esophagitis: Secondary | ICD-10-CM

## 2018-08-07 DIAGNOSIS — E1140 Type 2 diabetes mellitus with diabetic neuropathy, unspecified: Secondary | ICD-10-CM | POA: Insufficient documentation

## 2018-08-07 DIAGNOSIS — E038 Other specified hypothyroidism: Secondary | ICD-10-CM

## 2018-08-07 DIAGNOSIS — Z6841 Body Mass Index (BMI) 40.0 and over, adult: Secondary | ICD-10-CM

## 2018-08-07 DIAGNOSIS — E1165 Type 2 diabetes mellitus with hyperglycemia: Secondary | ICD-10-CM

## 2018-08-07 MED ORDER — LEVOTHYROXINE SODIUM 175 MCG PO TABS: 175.0000 ug | ORAL_TABLET | Freq: Every day | ORAL | 1 refills | Status: DC

## 2018-08-07 MED ORDER — OMEPRAZOLE 20 MG PO CPDR: 20.0000 mg | DELAYED_RELEASE_CAPSULE | Freq: Every day | ORAL | 3 refills | Status: DC

## 2018-08-07 MED FILL — OMEPRAZOLE 20 MG CAP: 20 | 30 days supply | Qty: 30 | Fill #0

## 2018-08-07 NOTE — Progress Notes (Signed)
Virtual Visit via Telephone Note Due to national recommendations of social distancing due to COVID 19, telehealth visit is felt to be most appropriate for this patient at this time.  I discussed the limitations, risks, security and privacy concerns of performing an evaluation and management service by telephone and the availability of in person appointments. I also discussed with the patient that there may be a patient responsible charge related to this service. The patient expressed understanding and agreed to proceed.    I connected with Theresia Majors on 08/07/18  at   9:30 AM EDT  EDT by telephone and verified that I am speaking with the correct person using two identifiers.   Consent I discussed the limitations, risks, security and privacy concerns of performing an evaluation and management service by telephone and the availability of in person appointments. I also discussed with the patient that there may be a patient responsible charge related to this service. The patient expressed understanding and agreed to proceed.   Location of Patient: Private Residence   Location of Provider: [facility]    Persons participating in Telemedicine visit: Bertram Denver FNP-BC [place]Y [place] CMA Theresia Majors    History of Present Illness: Telemedicine visit for: DM TYPE 2, meter check and Hypothyroidism  DM TYPE 2 Meter check:  Averages: 7 day 138; 14 day 121; 30 day 122 Blood glucose level this morning 93. Denies any hypo glycemic symptoms.  Currently taking metformin 850 mg daily. Taking gabapentin 300 mg TID for neuropathy. Lab Results  Component Value Date   HGBA1C 6.1 (H) 07/10/2018    Hypothyroidism CHUN DANEY is a 38 y.o. male who presents for follow up of hypothyroidism. Taking synthroid as prescribed however had been out of his medication for a few weeks. Current symptoms: change in energy level and weight changes . Patient denies diarrhea, heat  / cold intolerance and nervousness. Symptoms have been intermittent. Lab Results  Component Value Date   TSH 18.600 (H) 07/05/2018   BP Readings from Last 3 Encounters:  03/09/18 118/81  02/20/18 128/77  11/20/17 118/77    Past Medical History:  Diagnosis Date  . Diabetes mellitus without complication ([facility]CC)   . Obesity   . Palpitations   . Thyroid disease     Past Surgical History:  Procedure Laterality Date  . ELEVATION OF DEPRESSED SKULL FRACTURE     age 20    Family History  Problem Relation Age of Onset  . Diabetes Mother   . Cancer Mother   . Hypertension Father   . Cancer Sister   . Birth defects Maternal Grandmother   . Birth defects Maternal Grandfather   . Birth defects Paternal Grandmother   . Birth defects Paternal Grandfather     Social History   Socioeconomic History  . Marital status: Married    Spouse name: Not on file  . Number of children: Not on file  . Years of education: Not on file  . Highest education level: Not on file  Occupational History  . Not on file  Social Needs  . Financial resource strain: Not on file  . Food insecurity:    Worry: Not on file    Inability: Not on file  . Transportation needs:    Medical: Not on file    Non-medical: Not on file  Tobacco Use  . Smoking status: Current Every Day Smoker    Types: Cigarettes  . Smokeless tobacco: Never Used  Substance  and Sexual Activity  . Alcohol use: Yes    Comment: occ  . Drug use: Yes    Types: Marijuana    Comment: occ  . Sexual activity: Yes  Lifestyle  . Physical activity:    Days per week: Not on file    Minutes per session: Not on file  . Stress: Not on file  Relationships  . Social connections:    Talks on phone: Not on file    Gets together: Not on file    Attends religious service: Not on file    Active member of club or organization: Not on file    Attends meetings of clubs or organizations: Not on file    Relationship status: Not on file  Other  Topics Concern  . Not on file  Social History Narrative  . Not on file     Observations/Objective: Awake, alert and oriented x 3   ROS  Assessment and Plan: Sharlet SalinaBenjamin was seen today for follow-up.  Diagnoses and all orders for this visit:  Type 2 diabetes mellitus with hyperglycemia, without long-term current use of insulin (HCC) -     Ambulatory referral to Ophthalmology  Other specified hypothyroidism -     levothyroxine (SYNTHROID) 175 MCG tablet; Take 1 tablet (175 mcg total) by mouth daily before breakfast for 30 days. PT NEEDS LABS FOR FURTHER REFILLS. -     TSH; Future  Gastroesophageal reflux disease, esophagitis presence not specified -     omeprazole (PRILOSEC) 20 MG capsule; Take 1 capsule (20 mg total) by mouth daily. INSTRUCTIONS: Avoid GERD Triggers: acidic, spicy or fried foods, caffeine, coffee, sodas,  alcohol and chocolate.   Morbid obesity with BMI of 50.0-59.9, adult (HCC) Discussed diet and exercise for person with BMI >40. Instructed: You must burn more calories than you eat. Losing 5 percent of your body weight should be considered a success. In the longer term, losing more than 15 percent of your body weight and staying at this weight is an extremely good result. However, keep in mind that even losing 5 percent of your body weight leads to important health benefits, so try not to get discouraged if you're not able to lose more than this. Will recheck weight in 3-6 months. He is interested in bariatric surgery. Wants to do more research and check with his insurance company first. Will follow up.     Follow Up Instructions Return in about 3 months (around 11/07/2018).     I discussed the assessment and treatment plan with the patient. The patient was provided an opportunity to ask questions and all were answered. The patient agreed with the plan and demonstrated an understanding of the instructions.   The patient was advised to call back or seek an in-person  evaluation if the symptoms worsen or if the condition fails to improve as anticipated.  I provided 21 minutes of non-face-to-face time during this encounter including median intraservice time, reviewing previous notes, labs, imaging, medications and explaining diagnosis and management.  Claiborne RiggZelda W Hezzie Karim, FNP-BC

## 2018-08-10 ENCOUNTER — Ambulatory Visit: Payer: Self-pay | Attending: Family Medicine

## 2018-08-10 ENCOUNTER — Other Ambulatory Visit: Payer: Self-pay

## 2018-08-10 DIAGNOSIS — E038 Other specified hypothyroidism: Secondary | ICD-10-CM

## 2018-08-11 LAB — TSH: TSH: 9.14 u[IU]/mL — ABNORMAL HIGH (ref 0.450–4.500)

## 2018-08-14 ENCOUNTER — Other Ambulatory Visit: Payer: Self-pay | Admitting: Nurse Practitioner

## 2018-08-14 DIAGNOSIS — E038 Other specified hypothyroidism: Secondary | ICD-10-CM

## 2018-08-14 MED ORDER — LEVOTHYROXINE SODIUM 200 MCG PO TABS: 200.0000 ug | ORAL_TABLET | Freq: Every day | ORAL | 1 refills | Status: DC

## 2018-09-05 MED FILL — ATENOLOL 50 MG TABLET: 50 | 30 days supply | Qty: 30 | Fill #8

## 2018-09-05 MED FILL — ?LEVOTHYROXINE 200 MCG TAB: 200 MCG | 30 days supply | Qty: 30 | Fill #0

## 2018-09-24 MED FILL — ?METFORMIN HCL 850 MG TABLE: 850 | 30 days supply | Qty: 30 | Fill #1

## 2018-10-04 MED FILL — ?ATENOLOL 50 MG TABLET: 50 | 30 days supply | Qty: 30 | Fill #9

## 2018-10-04 MED FILL — ?LEVOTHYROXINE 200 MCG TAB: 200 MCG | 30 days supply | Qty: 30 | Fill #1

## 2018-10-25 MED FILL — ?METFORMIN HCL 850 MG TABLE: 850 | 30 days supply | Qty: 30 | Fill #2

## 2018-11-01 MED FILL — OMEPRAZOLE 20 MG CAP: 20 | 30 days supply | Qty: 30 | Fill #1

## 2018-11-01 MED FILL — ?LEVOTHYROXINE 200 MCG TAB: 200 MCG | 30 days supply | Qty: 30 | Fill #2

## 2018-11-01 MED FILL — TRUE METRIX TEST STRIP: 25 days supply | Qty: 100 | Fill #1

## 2018-11-02 MED FILL — ?ATENOLOL 50 MG TABLET: 50 | 30 days supply | Qty: 30 | Fill #10

## 2018-11-21 MED FILL — ?METFORMIN HCL 850 MG TABLE: 850 | 30 days supply | Qty: 30 | Fill #3

## 2018-11-23 MED FILL — TRUE METRIX TEST STRIP: 25 days supply | Qty: 100 | Fill #2

## 2018-11-23 MED FILL — ATENOLOL 50 MG TABLET: 50 | 30 days supply | Qty: 30 | Fill #0

## 2018-11-29 ENCOUNTER — Other Ambulatory Visit: Payer: Self-pay

## 2018-11-29 DIAGNOSIS — E038 Other specified hypothyroidism: Secondary | ICD-10-CM

## 2018-11-30 ENCOUNTER — Other Ambulatory Visit: Payer: Self-pay

## 2018-11-30 ENCOUNTER — Ambulatory Visit: Payer: Self-pay | Attending: Nurse Practitioner

## 2018-11-30 DIAGNOSIS — E038 Other specified hypothyroidism: Secondary | ICD-10-CM

## 2018-12-01 LAB — TSH: TSH: 2.31 u[IU]/mL (ref 0.450–4.500)

## 2018-12-03 ENCOUNTER — Other Ambulatory Visit: Payer: Self-pay | Admitting: Nurse Practitioner

## 2018-12-03 DIAGNOSIS — E038 Other specified hypothyroidism: Secondary | ICD-10-CM

## 2018-12-03 DIAGNOSIS — Z87898 Personal history of other specified conditions: Secondary | ICD-10-CM

## 2018-12-03 DIAGNOSIS — K219 Gastro-esophageal reflux disease without esophagitis: Secondary | ICD-10-CM

## 2018-12-03 DIAGNOSIS — E1165 Type 2 diabetes mellitus with hyperglycemia: Secondary | ICD-10-CM

## 2018-12-03 MED ORDER — TRUEPLUS LANCETS 28G MISC: 28.0000 g | Freq: Four times a day (QID) | 3 refills | Status: DC

## 2018-12-03 MED ORDER — GABAPENTIN 300 MG PO CAPS: 300.0000 mg | ORAL_CAPSULE | Freq: Three times a day (TID) | ORAL | 3 refills | Status: DC

## 2018-12-03 MED ORDER — ATENOLOL 50 MG PO TABS: 50.0000 mg | ORAL_TABLET | Freq: Every day | ORAL | 3 refills | Status: DC

## 2018-12-03 MED ORDER — LEVOTHYROXINE SODIUM 200 MCG PO TABS: 200.0000 ug | ORAL_TABLET | Freq: Every day | ORAL | 2 refills | Status: DC

## 2018-12-03 MED ORDER — METFORMIN HCL 850 MG PO TABS: 850.0000 mg | ORAL_TABLET | Freq: Every day | ORAL | 1 refills | Status: DC

## 2018-12-03 MED ORDER — TRUE METRIX BLOOD GLUCOSE TEST VI STRP: ORAL_STRIP | 12 refills | Status: DC

## 2018-12-03 MED ORDER — OMEPRAZOLE 20 MG PO CPDR: 20.0000 mg | DELAYED_RELEASE_CAPSULE | Freq: Every day | ORAL | 2 refills | Status: DC

## 2018-12-04 MED FILL — ?LEVOTHYROXINE 200 MCG TAB: 200 MCG | 30 days supply | Qty: 30 | Fill #0

## 2018-12-04 MED FILL — TRUEplus LANCETS 28G MISC: 25 days supply | Qty: 100 | Fill #0

## 2018-12-04 MED FILL — ?OMEPRAZOLE 20MG CAP DR: 20 | 30 days supply | Qty: 30 | Fill #0

## 2018-12-04 MED FILL — GABAPENTIN 300 MG CAPSULE: 300 | 30 days supply | Qty: 90 | Fill #0

## 2018-12-25 MED FILL — ?METFORMIN HCL 850 MG TABLE: 850 | 30 days supply | Qty: 30 | Fill #4

## 2019-01-03 MED FILL — ?ATENOLOL 50 MG TABLET: 50 | 30 days supply | Qty: 30 | Fill #1

## 2019-01-03 MED FILL — ?LEVOTHYROXINE 200 MCG TAB: 200 MCG | 30 days supply | Qty: 30 | Fill #1

## 2019-01-16 MED FILL — ?OMEPRAZOLE 20MG CAP DR: 20 | 30 days supply | Qty: 30 | Fill #1

## 2019-01-23 MED FILL — ?METFORMIN HCL 850 MG TABLE: 850 | 30 days supply | Qty: 30 | Fill #5

## 2019-02-04 MED FILL — ?LEVOTHYROXINE 200 MCG TAB: 200 MCG | 30 days supply | Qty: 30 | Fill #2

## 2019-02-04 MED FILL — ?ATENOLOL 50 MG TABLET: 50 | 30 days supply | Qty: 30 | Fill #2

## 2019-02-14 ENCOUNTER — Ambulatory Visit: Payer: Self-pay | Attending: Nurse Practitioner | Admitting: Physician Assistant

## 2019-02-14 ENCOUNTER — Other Ambulatory Visit: Payer: Self-pay

## 2019-02-14 VITALS — BP 150/84 | HR 51 | Temp 98.0°F | Ht 73.0 in | Wt 392.0 lb

## 2019-02-14 DIAGNOSIS — K2100 Gastro-esophageal reflux disease with esophagitis, without bleeding: Secondary | ICD-10-CM

## 2019-02-14 DIAGNOSIS — E1165 Type 2 diabetes mellitus with hyperglycemia: Secondary | ICD-10-CM

## 2019-02-14 DIAGNOSIS — Z6841 Body Mass Index (BMI) 40.0 and over, adult: Secondary | ICD-10-CM

## 2019-02-14 DIAGNOSIS — Z87898 Personal history of other specified conditions: Secondary | ICD-10-CM

## 2019-02-14 DIAGNOSIS — E038 Other specified hypothyroidism: Secondary | ICD-10-CM

## 2019-02-14 LAB — POCT GLYCOSYLATED HEMOGLOBIN (HGB A1C): HbA1c, POC (controlled diabetic range): 6.2 % (ref 0.0–7.0)

## 2019-02-14 LAB — GLUCOSE, POCT (MANUAL RESULT ENTRY): POC Glucose: 113 mg/dl — AB (ref 70–99)

## 2019-02-14 MED ORDER — TRUEPLUS LANCETS 28G MISC: 28.0000 g | Freq: Four times a day (QID) | 3 refills | Status: DC

## 2019-02-14 MED ORDER — GABAPENTIN 300 MG PO CAPS: 300.0000 mg | ORAL_CAPSULE | Freq: Three times a day (TID) | ORAL | 3 refills | Status: DC

## 2019-02-14 MED ORDER — TRUE METRIX BLOOD GLUCOSE TEST VI STRP: ORAL_STRIP | 12 refills | Status: DC

## 2019-02-14 MED ORDER — ATENOLOL 50 MG PO TABS: 50.0000 mg | ORAL_TABLET | Freq: Every day | ORAL | 3 refills | Status: DC

## 2019-02-14 MED ORDER — LEVOTHYROXINE SODIUM 200 MCG PO TABS: 200.0000 ug | ORAL_TABLET | Freq: Every day | ORAL | 2 refills | Status: DC

## 2019-02-14 MED ORDER — OMEPRAZOLE 20 MG PO CPDR: 20.0000 mg | DELAYED_RELEASE_CAPSULE | Freq: Every day | ORAL | 2 refills | Status: DC

## 2019-02-14 MED ORDER — METFORMIN HCL 850 MG PO TABS: 850.0000 mg | ORAL_TABLET | Freq: Every day | ORAL | 1 refills | Status: DC

## 2019-02-14 MED FILL — TRUE METRIX GLUCOSE TEST ST: 25 days supply | Qty: 100 | Fill #0

## 2019-02-14 MED FILL — GABAPENTIN 300 MG CAPSULE: 300 | 30 days supply | Qty: 90 | Fill #0

## 2019-02-14 MED FILL — ?METFORMIN HCL 850 MG TABLE: 850 | 30 days supply | Qty: 30 | Fill #0

## 2019-02-14 MED FILL — ?OMEPRAZOLE 20MG CAP DR: 20 | 30 days supply | Qty: 30 | Fill #0

## 2019-02-14 MED FILL — TRUEplus LANCETS 28G MISC: 25 days supply | Qty: 100 | Fill #0

## 2019-02-14 NOTE — Progress Notes (Signed)
Patient ID: Austin Beasley, male   DOB: Nov 09, 1980, 38 y.o.   MRN: [id number]   Austin Beasley, is a 38 y.o. male  [id number]:[id number]  [id number]  DOB - Mar 22, 1980  Subjective:  Chief Complaint and HPI: Austin Beasley is a 38 y.o. male here today for med RF.  Doing well overall.  Tries to adhere to diabetic diet.  Compliant with meds.  Some blood sugars in low 200s(he has his meter)but most 80s-120s.  Denies s/sx with hyper/hypoglycemia.   No CP.  No SOB.  Already had flu shot.  He feels thyroid is doing ok as no s/sx of low/high.    ROS:   Constitutional:  No f/c, No night sweats, No unexplained weight loss. EENT:  No vision changes, No blurry vision, No hearing changes. No mouth, throat, or ear problems.  Respiratory: No cough, No SOB Cardiac: No CP, no palpitations GI:  No abd pain, No N/V/D. GU: No Urinary s/sx Musculoskeletal: No joint pain Neuro: No headache, no dizziness, no motor weakness.  Skin: No rash Endocrine:  No polydipsia. No polyuria.  Psych: Denies SI/HI  No problems updated.  ALLERGIES: No Known Allergies  PAST MEDICAL HISTORY: Past Medical History:  Diagnosis Date  . Diabetes mellitus without complication ([place])   . Obesity   . Palpitations   . Thyroid disease     MEDICATIONS AT HOME: Prior to Admission medications   Medication Sig Start Date End Date Taking? Authorizing Provider  atenolol (TENORMIN) 50 MG tablet Take 1 tablet (50 mg total) by mouth daily. 02/14/19  Yes Cornelia Walraven M, PA-C  Blood Glucose Monitoring Suppl (TRUE METRIX METER) DEVI 1 kit by Does not apply route 4 (four) times daily. 05/03/17  Yes Ena Dawley, Tiffany S, PA-C  cyclobenzaprine (FLEXERIL) 10 MG tablet Take 1 tablet (10 mg total) by mouth 3 (three) times daily as needed for muscle spasms. 03/09/18  Yes Jacqlyn Larsen, PA-C  glucose blood (TRUE METRIX BLOOD GLUCOSE TEST) test strip Use as instructed 02/14/19  Yes Narek Kniss M, PA-C  ibuprofen (ADVIL,MOTRIN) 600 MG tablet Take  1 tablet (600 mg total) by mouth every 6 (six) hours as needed. 03/09/18  Yes Jacqlyn Larsen, PA-C  metFORMIN (GLUCOPHAGE) 850 MG tablet Take 1 tablet (850 mg total) by mouth daily with breakfast. 02/14/19 05/15/19 Yes Taylon Louison, Dionne Bucy, PA-C  omeprazole (PRILOSEC) 20 MG capsule Take 1 capsule (20 mg total) by mouth daily. 02/14/19  Yes Freeman Caldron M, PA-C  TRUEplus Lancets 28G MISC 28 g by Does not apply route QID. 02/14/19  Yes Jamie-Lee Galdamez M, PA-C  clotrimazole (LOTRIMIN) 1 % cream Apply to affected area 2 times daily Patient not taking: Reported on 02/20/2018 04/24/17   Jeannett Senior, PA-C  gabapentin (NEURONTIN) 300 MG capsule Take 1 capsule (300 mg total) by mouth 3 (three) times daily. 02/14/19 03/16/19  Argentina Donovan, PA-C  levothyroxine (SYNTHROID) 200 MCG tablet Take 1 tablet (200 mcg total) by mouth daily before breakfast. 02/14/19 03/16/19  Argentina Donovan, PA-C  naproxen (NAPROSYN) 500 MG tablet Take 1 tablet (500 mg total) by mouth 2 (two) times daily with a meal. Patient not taking: Reported on 02/20/2018 08/15/17   Gildardo Pounds, NP  ranitidine (ZANTAC) 150 MG tablet Take 150 mg by mouth daily as needed for heartburn.     [provider]     Objective:  EXAM:   Vitals:   02/14/19 0850  BP: (!) 150/84  Pulse: (!) 51  Temp: 98  F (36.7 C)  TempSrc: Oral  SpO2: 98%  Weight: (!) 392 lb (177.8 kg)  Height: _0  (1.854 m)    General appearance : A&OX3. NAD. Non-toxic-appearing HEENT: Atraumatic and Normocephalic.  PERRLA. EOM intact.  Chest/Lungs:  Breathing-non-labored, Good air entry bilaterally, breath sounds normal without rales, rhonchi, or wheezing  CVS: S1 S2 regular, no murmurs, gallops, rubs  Extremities: Bilateral Lower Ext shows no edema, both legs are warm to touch with = pulse throughout Neurology:  CN II-XII grossly intact, Non focal.   Psych:  TP linear. J/I WNL. Normal speech. Appropriate eye contact and affect.  Skin:  No Rash   Data Review Lab Results  Component Value Date   HGBA1C 6.2 02/14/2019   HGBA1C 6.1 (H) 07/10/2018   HGBA1C 5.8 02/20/2018     Assessment & Plan   1. Type 2 diabetes mellitus with hyperglycemia, without long-term current use of insulin (HCC) suboptimal but adequate control.  Continue to improve diet; weight loss recommended - Glucose (CBG) - HgB A1c - Comprehensive metabolic panel - Lipid panel - CBC with Differential - metFORMIN (GLUCOPHAGE) 850 MG tablet; Take 1 tablet (850 mg total) by mouth daily with breakfast.  Dispense: 90 tablet; Refill: 1 - gabapentin (NEURONTIN) 300 MG capsule; Take 1 capsule (300 mg total) by mouth 3 (three) times daily.  Dispense: 90 capsule; Refill: 3 - glucose blood (TRUE METRIX BLOOD GLUCOSE TEST) test strip; Use as instructed  Dispense: 100 each; Refill: 12 - TRUEplus Lancets 28G MISC; 28 g by Does not apply route QID.  Dispense: 120 each; Refill: 3  2. Other specified hypothyroidism - Comprehensive metabolic panel - Thyroid Panel With TSH - levothyroxine (SYNTHROID) 200 MCG tablet; Take 1 tablet (200 mcg total) by mouth daily before breakfast.  Dispense: 30 tablet; Refill: 2  3. Gastroesophageal reflux disease with esophagitis without hemorrhage - omeprazole (PRILOSEC) 20 MG capsule; Take 1 capsule (20 mg total) by mouth daily.  Dispense: 90 capsule; Refill: 2  4. History of palpitations controlled - atenolol (TENORMIN) 50 MG tablet; Take 1 tablet (50 mg total) by mouth daily.  Dispense: 90 tablet; Refill: 3  5. Morbid obesity with BMI of 45.0-49.9, adult ([place]) Continue to improve diet; weight loss recommended - Lipid panel  Patient have been counseled extensively about nutrition and exercise  Return in about 3 months (around 05/15/2019) for with PCP-for chronic conditions.  The patient was given clear instructions to go to ER or return to medical center if symptoms don't improve, worsen or new problems develop. The patient verbalized  understanding. The patient was told to call to get lab results if they haven't heard anything in the next week.     Freeman Caldron, PA-C [facility] [place], [place]   02/14/2019, 9:12 AM

## 2019-02-15 LAB — LIPID PANEL
Chol/HDL Ratio: 3.4 ratio (ref 0.0–5.0)
Cholesterol, Total: 156 mg/dL (ref 100–199)
HDL: 46 mg/dL (ref >39)
LDL Chol Calc (NIH): 89 mg/dL (ref 0–99)
Triglycerides: 116 mg/dL (ref 0–149)
VLDL Cholesterol Cal: 21 mg/dL (ref 5–40)

## 2019-02-15 LAB — CBC WITH DIFFERENTIAL/PLATELET
Basophils Absolute: 0.1 10*3/uL (ref 0.0–0.2)
Basos: 1 %
EOS (ABSOLUTE): 0.4 10*3/uL (ref 0.0–0.4)
Eos: 5 %
Hematocrit: 38.9 % (ref 37.5–51.0)
Hemoglobin: 13.5 g/dL (ref 13.0–17.7)
Immature Grans (Abs): 0 10*3/uL (ref 0.0–0.1)
Immature Granulocytes: 0 %
Lymphocytes Absolute: 2.1 10*3/uL (ref 0.7–3.1)
Lymphs: 31 %
MCH: 32.2 pg (ref 26.6–33.0)
MCHC: 34.7 g/dL (ref 31.5–35.7)
MCV: 93 fL (ref 79–97)
Monocytes Absolute: 0.8 10*3/uL (ref 0.1–0.9)
Monocytes: 11 %
Neutrophils Absolute: 3.4 10*3/uL (ref 1.4–7.0)
Neutrophils: 52 %
Platelets: 303 10*3/uL (ref 150–450)
RBC: 4.19 x10E6/uL (ref 4.14–5.80)
RDW: 12.4 % (ref 11.6–15.4)
WBC: 6.8 10*3/uL (ref 3.4–10.8)

## 2019-02-15 LAB — COMPREHENSIVE METABOLIC PANEL
ALT: 20 IU/L (ref 0–44)
AST: 18 IU/L (ref 0–40)
Albumin/Globulin Ratio: 1.4 (ref 1.2–2.2)
Albumin: 4.2 g/dL (ref 4.0–5.0)
Alkaline Phosphatase: 63 IU/L (ref 39–117)
BUN/Creatinine Ratio: 14 (ref 9–20)
BUN: 16 mg/dL (ref 6–20)
Bilirubin Total: 0.5 mg/dL (ref 0.0–1.2)
CO2: 23 mmol/L (ref 20–29)
Calcium: 9.6 mg/dL (ref 8.7–10.2)
Chloride: 104 mmol/L (ref 96–106)
Creatinine, Ser: 1.13 mg/dL (ref 0.76–1.27)
GFR calc Af Amer: 94 mL/min/{1.73_m2} (ref >59)
GFR calc non Af Amer: 81 mL/min/{1.73_m2} (ref >59)
Globulin, Total: 2.9 g/dL (ref 1.5–4.5)
Glucose: 117 mg/dL — ABNORMAL HIGH (ref 65–99)
Potassium: 4.4 mmol/L (ref 3.5–5.2)
Sodium: 141 mmol/L (ref 134–144)
Total Protein: 7.1 g/dL (ref 6.0–8.5)

## 2019-02-15 LAB — THYROID PANEL WITH TSH
Free Thyroxine Index: 2.9 (ref 1.2–4.9)
T3 Uptake Ratio: 32 % (ref 24–39)
T4, Total: 9.1 ug/dL (ref 4.5–12.0)
TSH: 2.55 u[IU]/mL (ref 0.450–4.500)

## 2019-02-27 MED FILL — ?LEVOTHYROXINE 200 MCG TAB: 200 MCG | 30 days supply | Qty: 30 | Fill #0

## 2019-02-27 MED FILL — ?ATENOLOL 50 MG TABLET: 50 | 90 days supply | Qty: 90 | Fill #0

## 2019-03-25 MED FILL — ?LEVOTHYROXINE 200 MCG TAB: 200 MCG | 30 days supply | Qty: 30 | Fill #1

## 2019-03-25 MED FILL — ?METFORMIN HCL 850 MG TABLE: 850 | 30 days supply | Qty: 30 | Fill #1

## 2019-03-25 MED FILL — ?OMEPRAZOLE 20MG CAP DR: 20 | 30 days supply | Qty: 30 | Fill #1

## 2019-04-24 MED FILL — ?LEVOTHYROXINE 200 MCG TAB: 200 MCG | 30 days supply | Qty: 30 | Fill #2

## 2019-04-24 MED FILL — ?METFORMIN HCL 850 MG TABLE: 850 | 30 days supply | Qty: 30 | Fill #2

## 2019-04-25 MED FILL — TRUE METRIX TEST STRIP: 25 days supply | Qty: 100 | Fill #1

## 2019-05-09 MED FILL — ?OMEPRAZOLE 20MG CAP DR: 20 | 30 days supply | Qty: 30 | Fill #2

## 2019-05-16 ENCOUNTER — Other Ambulatory Visit: Payer: Self-pay

## 2019-05-16 ENCOUNTER — Ambulatory Visit: Payer: Self-pay | Attending: Nurse Practitioner | Admitting: Physician Assistant

## 2019-05-16 DIAGNOSIS — F17200 Nicotine dependence, unspecified, uncomplicated: Secondary | ICD-10-CM

## 2019-05-16 DIAGNOSIS — Z87898 Personal history of other specified conditions: Secondary | ICD-10-CM

## 2019-05-16 DIAGNOSIS — E1165 Type 2 diabetes mellitus with hyperglycemia: Secondary | ICD-10-CM

## 2019-05-16 DIAGNOSIS — K2100 Gastro-esophageal reflux disease with esophagitis, without bleeding: Secondary | ICD-10-CM

## 2019-05-16 DIAGNOSIS — E038 Other specified hypothyroidism: Secondary | ICD-10-CM

## 2019-05-16 DIAGNOSIS — Z6841 Body Mass Index (BMI) 40.0 and over, adult: Secondary | ICD-10-CM

## 2019-05-16 MED ORDER — METFORMIN HCL 850 MG PO TABS: 850.0000 mg | ORAL_TABLET | Freq: Every day | ORAL | 1 refills | Status: DC

## 2019-05-16 MED ORDER — OMEPRAZOLE 20 MG PO CPDR: 20.0000 mg | DELAYED_RELEASE_CAPSULE | Freq: Every day | ORAL | 2 refills | Status: DC

## 2019-05-16 MED ORDER — ATENOLOL 50 MG PO TABS: 50.0000 mg | ORAL_TABLET | Freq: Every day | ORAL | 3 refills | Status: DC

## 2019-05-16 MED ORDER — LEVOTHYROXINE SODIUM 200 MCG PO TABS: 200.0000 ug | ORAL_TABLET | Freq: Every day | ORAL | 2 refills | Status: DC

## 2019-05-16 MED ORDER — TRUE METRIX BLOOD GLUCOSE TEST VI STRP: ORAL_STRIP | 12 refills | Status: DC

## 2019-05-16 MED ORDER — GABAPENTIN 300 MG PO CAPS: 300.0000 mg | ORAL_CAPSULE | Freq: Three times a day (TID) | ORAL | 3 refills | Status: DC

## 2019-05-16 MED FILL — metFORMIN HCL 850 MG TABS: 850 | 30 days supply | Qty: 30 | Fill #0

## 2019-05-16 MED FILL — ?LEVOTHYROXINE 200 MCG TAB: 200 MCG | 30 days supply | Qty: 30 | Fill #0

## 2019-05-16 MED FILL — ATENOLOL 50 MG TABLET: 50 | 30 days supply | Qty: 30 | Fill #0

## 2019-05-16 MED FILL — TRUE METRIX TEST STRIP: 25 days supply | Qty: 100 | Fill #0

## 2019-05-16 MED FILL — GABAPENTIN 300 MG CAPSULE: 300 | 30 days supply | Qty: 90 | Fill #0

## 2019-05-16 NOTE — Progress Notes (Signed)
Patient ID: Austin Beasley, male   DOB: [birth year], 39 y.o.   MRN: [id number] Virtual Visit via Telephone Note  I connected with Theresia Majors on 05/16/19 at 10:10 AM EST by telephone and verified that I am speaking with the correct person using two identifiers.   I discussed the limitations, risks, security and privacy concerns of performing an evaluation and management service by telephone and the availability of in person appointments. I also discussed with the patient that there may be a patient responsible charge related to this service. The patient expressed understanding and agreed to proceed.  PATIENT visit by telephone virtually in the context of Covid-19 pandemic. Patient location:  home My Location:  [facility] office Persons on the call:  Me and the patient  History of Present Illness: Patient needs RF on meds.  Last labs about 9 weeks ago.  A1C=6.2.  He checks his blood sugars once daily.  7 day avg=123, 14 day avg 108, 30day avg 105.  He is doing well.  No s/sx hyper/hypoglycemia.    Still smoking about 1ppd.  Drinks alcohol socially about 1x/month  He is 6 feet tall and weighs about 390.  He is interested in surgical options for obesity and is requesting referral.     Observations/Objective:  NAD.  A&Ox3   Assessment and Plan: 1. History of palpitations stable - atenolol (TENORMIN) 50 MG tablet; Take 1 tablet (50 mg total) by mouth daily.  Dispense: 90 tablet; Refill: 3  2. Type 2 diabetes mellitus with hyperglycemia, without long-term current use of insulin (HCC) Adequate control-continue current regimen - gabapentin (NEURONTIN) 300 MG capsule; Take 1 capsule (300 mg total) by mouth 3 (three) times daily.  Dispense: 90 capsule; Refill: 3 - metFORMIN (GLUCOPHAGE) 850 MG tablet; Take 1 tablet (850 mg total) by mouth daily with breakfast.  Dispense: 90 tablet; Refill: 1 - glucose blood (TRUE METRIX BLOOD GLUCOSE TEST) test strip; Use as instructed  Dispense: 100 each; Refill:  12  3. Other specified hypothyroidism stable at labs in December - levothyroxine (SYNTHROID) 200 MCG tablet; Take 1 tablet (200 mcg total) by mouth daily before breakfast.  Dispense: 30 tablet; Refill: 2  4. Gastroesophageal reflux disease with esophagitis without hemorrhage - omeprazole (PRILOSEC) 20 MG capsule; Take 1 capsule (20 mg total) by mouth daily.  Dispense: 90 capsule; Refill: 2  5. Class 3 severe obesity with serious comorbidity and body mass index (BMI) of 50.0 to 59.9 in adult, unspecified obesity type (HCC) - Amb Referral to Bariatric Surgery  6. Tobacco dependence Smoking and dangers of nicotine have been discussed at length. Long term health consequences of smoking reviewed in detail.  Methods for helping with cessation have been reviewed.  Patient expresses understanding.   Follow Up Instructions: See PCP in 3 months    I discussed the assessment and treatment plan with the patient. The patient was provided an opportunity to ask questions and all were answered. The patient agreed with the plan and demonstrated an understanding of the instructions.   The patient was advised to call back or seek an in-person evaluation if the symptoms worsen or if the condition fails to improve as anticipated.  I provided 12 minutes of non-face-to-face time during this encounter.   Georgian Co, PA-C

## 2019-05-16 NOTE — Progress Notes (Signed)
DOB and Name verified  Pt request meds refils  CBG readings are on the upper hundred but bellow 150

## 2019-06-04 ENCOUNTER — Encounter: Payer: Self-pay | Admitting: Nurse Practitioner

## 2019-06-06 ENCOUNTER — Encounter: Payer: Self-pay | Admitting: Nurse Practitioner

## 2019-06-10 NOTE — Telephone Encounter (Signed)
Spoke to patient and scheduled him a televisit with PCP.   Pt. Scheduled to come in on Thursday for labs before the televisit with PCP.

## 2019-06-12 MED FILL — ?OMEPRAZOLE 20 MG CAP DR: 20 | 30 days supply | Qty: 30 | Fill #3

## 2019-06-13 ENCOUNTER — Ambulatory Visit: Payer: Self-pay | Attending: Nurse Practitioner

## 2019-06-13 ENCOUNTER — Other Ambulatory Visit: Payer: Self-pay

## 2019-06-13 DIAGNOSIS — E1165 Type 2 diabetes mellitus with hyperglycemia: Secondary | ICD-10-CM

## 2019-06-13 DIAGNOSIS — E038 Other specified hypothyroidism: Secondary | ICD-10-CM

## 2019-06-13 DIAGNOSIS — E119 Type 2 diabetes mellitus without complications: Secondary | ICD-10-CM

## 2019-06-14 ENCOUNTER — Encounter: Payer: Self-pay | Admitting: Nurse Practitioner

## 2019-06-14 ENCOUNTER — Ambulatory Visit: Payer: Self-pay | Attending: Nurse Practitioner | Admitting: Nurse Practitioner

## 2019-06-14 DIAGNOSIS — E785 Hyperlipidemia, unspecified: Secondary | ICD-10-CM

## 2019-06-14 DIAGNOSIS — M545 Low back pain: Secondary | ICD-10-CM

## 2019-06-14 DIAGNOSIS — G8929 Other chronic pain: Secondary | ICD-10-CM

## 2019-06-14 DIAGNOSIS — Z6841 Body Mass Index (BMI) 40.0 and over, adult: Secondary | ICD-10-CM

## 2019-06-14 DIAGNOSIS — E1165 Type 2 diabetes mellitus with hyperglycemia: Secondary | ICD-10-CM

## 2019-06-14 LAB — CBC
Hematocrit: 41.8 % (ref 37.5–51.0)
Hemoglobin: 14.1 g/dL (ref 13.0–17.7)
MCH: 31.5 pg (ref 26.6–33.0)
MCHC: 33.7 g/dL (ref 31.5–35.7)
MCV: 94 fL (ref 79–97)
Platelets: 319 10*3/uL (ref 150–450)
RBC: 4.47 x10E6/uL (ref 4.14–5.80)
RDW: 12.3 % (ref 11.6–15.4)
WBC: 6.6 10*3/uL (ref 3.4–10.8)

## 2019-06-14 LAB — LIPID PANEL
Chol/HDL Ratio: 3.1 ratio (ref 0.0–5.0)
Cholesterol, Total: 147 mg/dL (ref 100–199)
HDL: 48 mg/dL (ref >39)
LDL Chol Calc (NIH): 80 mg/dL (ref 0–99)
Triglycerides: 104 mg/dL (ref 0–149)
VLDL Cholesterol Cal: 19 mg/dL (ref 5–40)

## 2019-06-14 LAB — CMP14+EGFR
ALT: 20 IU/L (ref 0–44)
AST: 18 IU/L (ref 0–40)
Albumin/Globulin Ratio: 1.3 (ref 1.2–2.2)
Albumin: 4 g/dL (ref 4.0–5.0)
Alkaline Phosphatase: 72 IU/L (ref 39–117)
BUN/Creatinine Ratio: 11 (ref 9–20)
BUN: 12 mg/dL (ref 6–20)
Bilirubin Total: 0.4 mg/dL (ref 0.0–1.2)
CO2: 24 mmol/L (ref 20–29)
Calcium: 9.7 mg/dL (ref 8.7–10.2)
Chloride: 102 mmol/L (ref 96–106)
Creatinine, Ser: 1.05 mg/dL (ref 0.76–1.27)
GFR calc Af Amer: 103 mL/min/{1.73_m2} (ref >59)
GFR calc non Af Amer: 89 mL/min/{1.73_m2} (ref >59)
Globulin, Total: 3.2 g/dL (ref 1.5–4.5)
Glucose: 103 mg/dL — ABNORMAL HIGH (ref 65–99)
Potassium: 4.6 mmol/L (ref 3.5–5.2)
Sodium: 138 mmol/L (ref 134–144)
Total Protein: 7.2 g/dL (ref 6.0–8.5)

## 2019-06-14 LAB — TSH: TSH: 0.68 u[IU]/mL (ref 0.450–4.500)

## 2019-06-14 MED ORDER — SIMVASTATIN 10 MG PO TABS: 10.0000 mg | ORAL_TABLET | Freq: Every day | ORAL | 3 refills | Status: DC

## 2019-06-14 MED ORDER — METFORMIN HCL 850 MG PO TABS: 850.0000 mg | ORAL_TABLET | Freq: Every day | ORAL | 1 refills | Status: DC

## 2019-06-14 MED ORDER — INDOMETHACIN ER 75 MG PO CPCR: 75.0000 mg | ORAL_CAPSULE | Freq: Every day | ORAL | 1 refills | Status: DC

## 2019-06-14 MED ORDER — INDOMETHACIN 25 MG PO CAPS: 25.0000 mg | ORAL_CAPSULE | Freq: Two times a day (BID) | ORAL | 0 refills | Status: AC

## 2019-06-14 MED FILL — metFORMIN HCL 850 MG TABS: 850 | 30 days supply | Qty: 30 | Fill #0

## 2019-06-14 NOTE — Progress Notes (Signed)
Virtual Visit via Telephone Note Due to national recommendations of social distancing due to COVID 19, telehealth visit is felt to be most appropriate for this patient at this time.  I discussed the limitations, risks, security and privacy concerns of performing an evaluation and management service by telephone and the availability of in person appointments. I also discussed with the patient that there may be a patient responsible charge related to this service. The patient expressed understanding and agreed to proceed.    I connected with Austin Beasley on 06/14/19  at   2:50 PM EDT  EDT by telephone and verified that I am speaking with the correct person using two identifiers.   Consent I discussed the limitations, risks, security and privacy concerns of performing an evaluation and management service by telephone and the availability of in person appointments. I also discussed with the patient that there may be a patient responsible charge related to this service. The patient expressed understanding and agreed to proceed.   Location of Patient: Private Residence    [facility] Provider[facility]    Persons participating in Telemedicine visit: Bertram Denver FNP-BC YY [place] CMA Austin Beasley    History of Present Illness: Telemedicine visit for: Follow Up   DM TYPE 2 Well controlled. Fasting Average: 98-110s.  7 day average 109 based on meter reading today. He takes gabapentin 300 mg TID for neuropathy. Current medications: metformin 850 mg daily. He is overdue for a diabetic retinopathy exam. Referral placed today.  Lab Results  Component Value Date   HGBA1C 6.2 02/14/2019    Dyslipidemia LDL not at goal of <70. Will send low dose statin to pharmacy. There is no previous history of statin intolerance.  Lab Results  Component Value Date   LDLCALC 80 06/13/2019   Chronic Low Back Pain Has tried indocin in the past with significant  improvement of symptoms. Denies any sciatica,  injury or trauma. There is no involuntary loss of bowel or bladder.   Morbid Obesity Interested in Bariatric surgery for weight management. Will place referral.   Past Medical History:  Diagnosis Date  . Diabetes mellitus without complication (HCC)   . Obesity   . Palpitations   . Thyroid disease     Past Surgical History:  Procedure Laterality Date  . ELEVATION OF DEPRESSED SKULL FRACTURE     age 39    Family History  Problem Relation Age of Onset  . Diabetes Mother   . Cancer Mother   . Hypertension Father   . Cancer Sister   . Birth defects Maternal Grandmother   . Birth defects Maternal Grandfather   . Birth defects Paternal Grandmother   . Birth defects Paternal Grandfather     Social History   Socioeconomic History  . Marital status: Married    Spouse name: Not on file  . Number of children: Not on file  . Years of education: Not on file  . Highest education level: Not on file  Occupational History  . Not on file  Tobacco Use  . Smoking status: Current Every Day Smoker    Types: Cigarettes  . Smokeless tobacco: Never Used  Substance and Sexual Activity  . Alcohol use: Yes    Comment: occ  . Drug use: Yes    Types: Marijuana    Comment: occ  . Sexual activity: Yes  Other Topics Concern  . Not on file  Social History Narrative  . Not on file  Social Determinants of Health   Financial Resource Strain:   . Difficulty of Paying Living Expenses:   Food Insecurity:   . Worried About Charity fundraiser in the Last Year:   . Arboriculturist in the Last Year:   Transportation Needs:   . Film/video editor (Medical):   Marland Kitchen Lack of Transportation (Non-Medical):   Physical Activity:   . Days of Exercise per Week:   . Minutes of Exercise per Session:   Stress:   . Feeling of Stress :   Social Connections:   . Frequency of Communication with Friends and Family:   . Frequency of Social Gatherings with  Friends and Family:   . Attends Religious Services:   . Active Member of Clubs or Organizations:   . Attends Archivist Meetings:   Marland Kitchen Marital Status:      Observations/Objective: Awake, alert and oriented x 3   Review of Systems  Constitutional: Negative for fever, malaise/fatigue and weight loss.  HENT: Negative.  Negative for nosebleeds.   Eyes: Negative.  Negative for blurred vision, double vision and photophobia.  Respiratory: Negative.  Negative for cough and shortness of breath.   Cardiovascular: Negative.  Negative for chest pain, palpitations and leg swelling.  Gastrointestinal: Negative.  Negative for heartburn, nausea and vomiting.  Musculoskeletal: Positive for back pain and myalgias.  Neurological: Positive for sensory change. Negative for dizziness, focal weakness, seizures and headaches.  Psychiatric/Behavioral: Negative.  Negative for suicidal ideas.    Assessment and Plan: Austin Beasley was seen today for referral.  Diagnoses and all orders for this visit:  Type 2 diabetes mellitus with hyperglycemia, without long-term current use of insulin ([place]) -     Ambulatory referral to Ophthalmology -     metFORMIN (GLUCOPHAGE) 850 MG tablet; Take 1 tablet (850 mg total) by mouth daily with breakfast. Continue blood sugar control as discussed in office today, low carbohydrate diet, and regular physical exercise as tolerated, 150 minutes per week (30 min each day, 5 days per week, or 50 min 3 days per week). Keep blood sugar logs with fasting goal of 90-130 mg/dl, post prandial (after you eat) less than 180.  For Hypoglycemia: BS <60 and Hyperglycemia BS >400; contact the clinic ASAP. Annual eye exams and foot exams are recommended.   Dyslipidemia, goal LDL below 70 -     simvastatin (ZOCOR) 10 MG tablet; Take 1 tablet (10 mg total) by mouth at bedtime. INSTRUCTIONS: Work on a low fat, heart healthy diet and participate in regular aerobic exercise program by working out  at least 150 minutes per week; 5 days a week-30 minutes per day. Avoid red meat/beef/steak,  fried foods. junk foods, sodas, sugary drinks, unhealthy snacking, alcohol and smoking.  Drink at least 80 oz of water per day and monitor your carbohydrate intake daily.    Morbid obesity with BMI of 40.0-44.9, adult ([place]) -     Amb Referral to Bariatric Surgery Discussed diet and exercise for person with BMI >40. Instructed: You must burn more calories than you eat. Losing 5 percent of your body weight should be considered a success. In the longer term, losing more than 15 percent of your body weight and staying at this weight is an extremely good result. However, keep in mind that even losing 5 percent of your body weight leads to important health benefits, so try not to get discouraged if you're not able to lose more than this. Will recheck weight in  3-6 months.  Chronic bilateral low back pain without sciatica -     indomethacin (INDOCIN) 25 MG capsule; Take 1 capsule (25 mg total) by mouth 2 (two) times daily with a meal. Work on losing weight to help reduce back pain. May alternate with heat and ice application for pain relief. May also alternate with acetaminophen  as prescribed for back pain. Other alternatives include massage, acupuncture and water aerobics.  You must stay active and avoid a sedentary lifestyle.       Follow Up Instructions No follow-ups on file.     I discussed the assessment and treatment plan with the patient. The patient was provided an opportunity to ask questions and all were answered. The patient agreed with the plan and demonstrated an understanding of the instructions.   The patient was advised to call back or seek an in-person evaluation if the symptoms worsen or if the condition fails to improve as anticipated.  I provided 18 minutes of non-face-to-face time during this encounter including median intraservice time, reviewing previous notes, labs, imaging,  medications and explaining diagnosis and management.  Claiborne Rigg, FNP-BC

## 2019-06-16 ENCOUNTER — Other Ambulatory Visit: Payer: Self-pay | Admitting: Nurse Practitioner

## 2019-06-16 DIAGNOSIS — K2100 Gastro-esophageal reflux disease with esophagitis, without bleeding: Secondary | ICD-10-CM

## 2019-06-16 DIAGNOSIS — E1165 Type 2 diabetes mellitus with hyperglycemia: Secondary | ICD-10-CM

## 2019-06-16 DIAGNOSIS — E038 Other specified hypothyroidism: Secondary | ICD-10-CM

## 2019-06-16 DIAGNOSIS — G8929 Other chronic pain: Secondary | ICD-10-CM

## 2019-06-16 MED ORDER — OMEPRAZOLE 20 MG PO CPDR: 20.0000 mg | DELAYED_RELEASE_CAPSULE | Freq: Every day | ORAL | 2 refills | Status: DC

## 2019-06-16 MED ORDER — GABAPENTIN 300 MG PO CAPS: 300.0000 mg | ORAL_CAPSULE | Freq: Three times a day (TID) | ORAL | 3 refills | Status: DC

## 2019-06-16 MED ORDER — CYCLOBENZAPRINE HCL 10 MG PO TABS: 10.0000 mg | ORAL_TABLET | Freq: Three times a day (TID) | ORAL | 0 refills | Status: DC | PRN

## 2019-06-16 MED ORDER — LEVOTHYROXINE SODIUM 200 MCG PO TABS: 200.0000 ug | ORAL_TABLET | Freq: Every day | ORAL | 2 refills | Status: DC

## 2019-06-16 MED ORDER — TRUEPLUS LANCETS 28G MISC: 28.0000 g | Freq: Four times a day (QID) | 3 refills | Status: DC

## 2019-06-17 MED FILL — CYCLOBENZAPRINE 10 MG TAB: 10 | 10 days supply | Qty: 30 | Fill #0

## 2019-06-17 MED FILL — INDOMETHACIN 25 MG CAPSULE: 25 | 30 days supply | Qty: 60 | Fill #0

## 2019-06-17 MED FILL — ?SIMVASTATIN 10MG TABL: 10 | 30 days supply | Qty: 30 | Fill #0

## 2019-06-17 MED FILL — TRUEplus LANCETS 28G MISC: 25 days supply | Qty: 100 | Fill #0

## 2019-06-17 MED FILL — ?LEVOTHYROXINE 200 MCG TAB: 200 MCG | 30 days supply | Qty: 30 | Fill #0

## 2019-06-17 MED FILL — GABAPENTIN 300 MG CAPSULE: 300 | 30 days supply | Qty: 90 | Fill #0

## 2019-07-02 ENCOUNTER — Encounter: Payer: Self-pay | Admitting: Pharmacist

## 2019-07-02 ENCOUNTER — Other Ambulatory Visit: Payer: Self-pay

## 2019-07-02 ENCOUNTER — Ambulatory Visit: Payer: No Typology Code available for payment source | Attending: Nurse Practitioner | Admitting: Pharmacist

## 2019-07-02 VITALS — BP 130/83 | HR 67

## 2019-07-02 DIAGNOSIS — Z0130 Encounter for examination of blood pressure without abnormal findings: Secondary | ICD-10-CM

## 2019-07-02 NOTE — Progress Notes (Signed)
   S:    PCP: Zelda   Patient arrives in good spirits. Presents to the clinic for BP check.  Patient was referred and last seen by Primary Care Provider on 06/14/2019.    Patient reports adherence with medications.  Patient denies chest pain, dyspnea, HA or blurred vision. No LE edema.   Current BP Medications include:  Atenolol 50 mg daily (hx of palpitations)  Dietary habits include: reports compliance with salt restriction; drinks the occasional soda Exercise habits include: limited outside of work Family / Social history:  -FHX: DM, HTN, cancer  -Tobacco: current every day smoker  -Alcohol: socially   O:  Vitals:   07/02/19 1542  BP: 130/83  Pulse: 67    Home BP readings: none   Last 3 Office BP readings: BP Readings from Last 3 Encounters:  07/02/19 130/83  02/14/19 (!) 150/84  03/09/18 118/81    BMET    Component Value Date/Time   NA 138 06/13/2019 0950   K 4.6 06/13/2019 0950   CL 102 06/13/2019 0950   CO2 24 06/13/2019 0950   GLUCOSE 103 (H) 06/13/2019 0950   GLUCOSE 560 (HH) 04/24/2017 1501   BUN 12 06/13/2019 0950   CREATININE 1.05 06/13/2019 0950   CALCIUM 9.7 06/13/2019 0950   GFRNONAA 89 06/13/2019 0950   GFRAA 103 06/13/2019 0950   Renal function: CrCl cannot be calculated (Unknown ideal weight.).  Clinical ASCVD: No  The ASCVD Risk score Denman George DC Jr., et al., 2013) failed to calculate for the following reasons:   The 2013 ASCVD risk score is only valid for ages 85 to 42   A/P: BP elevated today. Technically, we have two different clinic values that are elevated since December. However, today's value is improved compared to December (130/83 down from 150/84). He is only on atenolol and endorses compliance. Will hold off on any changes and make PCP aware.  -Continued current regimen.  -Counseled on lifestyle modifications for blood pressure control including reduced dietary sodium, increased exercise, adequate sleep  Results reviewed and  written information provided.   Total time in face-to-face counseling 15 minutes.   F/U Clinic Visit in June with PCP.  Butch Penny, PharmD, CPP Clinical Pharmacist [facility] [phone]

## 2019-07-04 MED FILL — ?ATENOLOL 50 MG TABLET: 50 | 90 days supply | Qty: 90 | Fill #1

## 2019-07-23 MED FILL — LEVOTHYROXINE 200 MCG TAB: 200 | 30 days supply | Qty: 30 | Fill #1

## 2019-07-23 MED FILL — metFORMIN HCL 850 MG TABS: 850 | 30 days supply | Qty: 30 | Fill #1

## 2019-07-26 ENCOUNTER — Telehealth: Payer: Self-pay

## 2019-07-26 MED FILL — SIMVASTATIN 10 MG TABLET: 10 | 30 days supply | Qty: 30 | Fill #1

## 2019-07-26 NOTE — Telephone Encounter (Signed)
Forms faxed on 07/06/19 per pt. Pt states [facility] nurse called him about a week or so ago to ask him questions to fill out form. Please advise if form is complete.  Please call pt by 07/29/19 to update.  [phone] pt ph.

## 2019-07-31 MED FILL — OMEPRAZOLE DR 20 MG CAPSULE: 20 | 90 days supply | Qty: 90 | Fill #0

## 2019-08-02 NOTE — Telephone Encounter (Signed)
Spoke to patient and informed his paperwork is ready. CMA faxed the paperwork to his case worker and also placed it up front at the front desk for patient to pick up. Pt. Is aware.

## 2019-08-16 ENCOUNTER — Ambulatory Visit: Payer: No Typology Code available for payment source | Attending: Nurse Practitioner | Admitting: Nurse Practitioner

## 2019-08-16 ENCOUNTER — Other Ambulatory Visit: Payer: Self-pay

## 2019-08-16 ENCOUNTER — Other Ambulatory Visit: Payer: Self-pay | Admitting: Nurse Practitioner

## 2019-08-16 ENCOUNTER — Encounter: Payer: Self-pay | Admitting: Nurse Practitioner

## 2019-08-16 VITALS — BP 105/71 | HR 63 | Temp 97.7°F | Resp 16 | Wt 386.6 lb

## 2019-08-16 DIAGNOSIS — E785 Hyperlipidemia, unspecified: Secondary | ICD-10-CM | POA: Diagnosis not present

## 2019-08-16 DIAGNOSIS — K2100 Gastro-esophageal reflux disease with esophagitis, without bleeding: Secondary | ICD-10-CM

## 2019-08-16 DIAGNOSIS — F17200 Nicotine dependence, unspecified, uncomplicated: Secondary | ICD-10-CM

## 2019-08-16 DIAGNOSIS — E1165 Type 2 diabetes mellitus with hyperglycemia: Secondary | ICD-10-CM | POA: Diagnosis not present

## 2019-08-16 DIAGNOSIS — E291 Testicular hypofunction: Secondary | ICD-10-CM

## 2019-08-16 DIAGNOSIS — E038 Other specified hypothyroidism: Secondary | ICD-10-CM

## 2019-08-16 DIAGNOSIS — Z6841 Body Mass Index (BMI) 40.0 and over, adult: Secondary | ICD-10-CM

## 2019-08-16 DIAGNOSIS — Z87898 Personal history of other specified conditions: Secondary | ICD-10-CM

## 2019-08-16 LAB — POCT GLYCOSYLATED HEMOGLOBIN (HGB A1C): HbA1c, POC (prediabetic range): 6.2 % (ref 5.7–6.4)

## 2019-08-16 LAB — GLUCOSE, POCT (MANUAL RESULT ENTRY): POC Glucose: 94 mg/dl (ref 70–99)

## 2019-08-16 MED ORDER — TRUE METRIX BLOOD GLUCOSE TEST VI STRP: ORAL_STRIP | 12 refills | Status: DC

## 2019-08-16 MED ORDER — PHENTERMINE HCL 37.5 MG PO TABS: 37.5000 mg | ORAL_TABLET | Freq: Every day | ORAL | 1 refills | Status: DC

## 2019-08-16 MED ORDER — SIMVASTATIN 10 MG PO TABS: 10.0000 mg | ORAL_TABLET | Freq: Every day | ORAL | 3 refills | Status: DC

## 2019-08-16 MED ORDER — TRUEPLUS LANCETS 28G MISC: 6 refills | Status: DC

## 2019-08-16 MED ORDER — ATENOLOL 50 MG PO TABS: 50.0000 mg | ORAL_TABLET | Freq: Every day | ORAL | 3 refills | Status: DC

## 2019-08-16 MED ORDER — GABAPENTIN 300 MG PO CAPS: 300.0000 mg | ORAL_CAPSULE | Freq: Three times a day (TID) | ORAL | 3 refills | Status: DC

## 2019-08-16 MED ORDER — TRUEPLUS LANCETS 28G MISC: 28.0000 g | Freq: Four times a day (QID) | 3 refills | Status: DC

## 2019-08-16 MED ORDER — METFORMIN HCL 850 MG PO TABS: 850.0000 mg | ORAL_TABLET | Freq: Every day | ORAL | 1 refills | Status: DC

## 2019-08-16 MED ORDER — OMEPRAZOLE 20 MG PO CPDR: 20.0000 mg | DELAYED_RELEASE_CAPSULE | Freq: Every day | ORAL | 2 refills | Status: DC

## 2019-08-16 MED ORDER — LEVOTHYROXINE SODIUM 200 MCG PO TABS: 200.0000 ug | ORAL_TABLET | Freq: Every day | ORAL | 2 refills | Status: DC

## 2019-08-16 NOTE — Progress Notes (Signed)
Assessment & Plan:  Austin Beasley was seen today for diabetes.  Diagnoses and all orders for this visit:  Type 2 diabetes mellitus with hyperglycemia, without long-term current use of insulin (HCC) -     POCT glucose (manual entry) -     POCT glycosylated hemoglobin (Hb A1C) -     Microalbumin / creatinine urine ratio -        TRUEplus Lancets 28G MISC; Use as instructed. Check blood glucose level by fingerstick twice per day.  E11.65 -     metFORMIN (GLUCOPHAGE) 850 MG tablet; Take 1 tablet (850 mg total) by mouth daily with breakfast. -     glucose blood (TRUE METRIX BLOOD GLUCOSE TEST) test strip; Use as instructed. Check blood glucose level by fingerstick twice per day.  E11.65 -     gabapentin (NEURONTIN) 300 MG capsule; Take 1 capsule (300 mg total) by mouth 3 (three) times daily. Continue blood sugar control as discussed in office today, low carbohydrate diet, and regular physical exercise as tolerated, 150 minutes per week (30 min each day, 5 days per week, or 50 min 3 days per week). Keep blood sugar logs with fasting goal of 90-130 mg/dl, post prandial (after you eat) less than 180.  For Hypoglycemia: BS <60 and Hyperglycemia BS >400; contact the clinic ASAP. Annual eye exams and foot exams are recommended.   Morbid obesity with BMI of 50.0-59.9, adult (HCC) -     phentermine (ADIPEX-P) 37.5 MG tablet; Take 1 tablet (37.5 mg total) by mouth daily before breakfast. Discussed diet and exercise for person with BMI >51. Instructed: You must burn more calories than you eat. Losing 5 percent of your body weight should be considered a success. In the longer term, losing more than 15 percent of your body weight and staying at this weight is an extremely good result. However, keep in mind that even losing 5 percent of your body weight leads to important health benefits, so try not to get discouraged if you're not able to lose more than this. Will recheck weight in 3-6 months.  Dyslipidemia,  goal LDL below 70 -     simvastatin (ZOCOR) 10 MG tablet; Take 1 tablet (10 mg total) by mouth at bedtime. INSTRUCTIONS: Work on a low fat, heart healthy diet and participate in regular aerobic exercise program by working out at least 150 minutes per week; 5 days a week-30 minutes per day. Avoid red meat/beef/steak,  fried foods. junk foods, sodas, sugary drinks, unhealthy snacking, alcohol and smoking.  Drink at least 80 oz of water per day and monitor your carbohydrate intake daily.    History of palpitations -     atenolol (TENORMIN) 50 MG tablet; Take 1 tablet (50 mg total) by mouth daily.  Other specified hypothyroidism -     levothyroxine (SYNTHROID) 200 MCG tablet; Take 1 tablet (200 mcg total) by mouth daily before breakfast.  Gastroesophageal reflux disease with esophagitis without hemorrhage -     omeprazole (PRILOSEC) 20 MG capsule; Take 1 capsule (20 mg total) by mouth daily. INSTRUCTIONS: Avoid GERD Triggers: acidic, spicy or fried foods, caffeine, coffee, sodas,  alcohol and chocolate.   Testosterone deficiency in male -     Testosterone; Future  Tobacco dependence Austin Beasley was counseled on the dangers of tobacco use, and was advised to quit. Reviewed strategies to maximize success, including removing cigarettes and smoking materials from environment, stress management and support of family/friends as well as pharmacological alternatives including: Wellbutrin, Chantix, Nicotine  patch, Nicotine gum or lozenges. Smoking cessation support: smoking cessation hotline: 1-800-QUIT-NOW.  Smoking cessation classes are also available through [facility]. Call [phone] or visit our website at [email or web address]   A total of 3 minutes was spent on counseling for smoking cessation and Austin Beasley is ready to quit and wants to start in September for his 39th bday.       Patient has been counseled on age-appropriate routine health concerns for screening and  prevention. These are reviewed and up-to-date. Referrals have been placed accordingly. Immunizations are up-to-date or declined.    Subjective:   Chief Complaint  Patient presents with  . Diabetes   HPI Austin Beasley 39 y.o. male presents to office today for follow up.  has a past medical history of Diabetes mellitus without complication ([place]), Obesity, Palpitations, and Thyroid disease.  DM TYPE 2 Well controlled with 850 mg of Metformin daily.  He takes gabapentin 300 mg 3 times a day for peripheral neuropathy.  Denies any symptoms of hypo or hyperglycemia.  Weight has started to slowly increase however he is working out at [employer] and desires to lose weight and keep it off.  He is also made changes in his dietary intake in regards to low-fat low-cholesterol. Lab Results  Component Value Date   HGBA1C 6.2 08/16/2019     Hyperlipidemia Patient presents for follow up to hyperlipidemia.  He is medication compliant  With taking simvastatin 10 mg daily  He is diet compliant and denies statin intolerance including myalgias.  Lab Results  Component Value Date   CHOL 147 06/13/2019   Lab Results  Component Value Date   HDL 48 06/13/2019   Lab Results  Component Value Date   LDLCALC 80 06/13/2019   Lab Results  Component Value Date   TRIG 104 06/13/2019   Lab Results  Component Value Date   CHOLHDL 3.1 06/13/2019    Review of Systems  Constitutional: Negative for fever, malaise/fatigue and weight loss.  HENT: Negative.  Negative for nosebleeds.   Eyes: Negative.  Negative for blurred vision, double vision and photophobia.  Respiratory: Negative.  Negative for cough, shortness of breath and wheezing.   Cardiovascular: Negative.  Negative for chest pain, palpitations and leg swelling.  Gastrointestinal: Negative.  Negative for heartburn, nausea and vomiting.  Musculoskeletal: Negative.  Negative for myalgias.  Neurological: Negative.  Negative for dizziness, focal  weakness, seizures and headaches.  Psychiatric/Behavioral: Negative.  Negative for suicidal ideas.    Past Medical History:  Diagnosis Date  . Diabetes mellitus without complication ([place])   . Obesity   . Palpitations   . Thyroid disease     Past Surgical History:  Procedure Laterality Date  . ELEVATION OF DEPRESSED SKULL FRACTURE     age 16    Family History  Problem Relation Age of Onset  . Diabetes Mother   . Cancer Mother   . Hypertension Father   . Cancer Sister   . Birth defects Maternal Grandmother   . Birth defects Maternal Grandfather   . Birth defects Paternal Grandmother   . Birth defects Paternal Grandfather     Social History Reviewed with no changes to be made today.   Outpatient Medications Prior to Visit  Medication Sig Dispense Refill  . Blood Glucose Monitoring Suppl (TRUE METRIX METER) DEVI 1 kit by Does not apply route 4 (four) times daily. 1 Device 0  . cyclobenzaprine (FLEXERIL) 10 MG tablet Take 1 tablet (10 mg  total) by mouth 3 (three) times daily as needed for muscle spasms. 30 tablet 0  . atenolol (TENORMIN) 50 MG tablet Take 1 tablet (50 mg total) by mouth daily. 90 tablet 3  . clotrimazole (LOTRIMIN) 1 % cream Apply to affected area 2 times daily (Patient not taking: Reported on 02/20/2018) 15 g 0  . gabapentin (NEURONTIN) 300 MG capsule Take 1 capsule (300 mg total) by mouth 3 (three) times daily. 90 capsule 3  . glucose blood (TRUE METRIX BLOOD GLUCOSE TEST) test strip Use as instructed 100 each 12  . levothyroxine (SYNTHROID) 200 MCG tablet Take 1 tablet (200 mcg total) by mouth daily before breakfast. 30 tablet 2  . metFORMIN (GLUCOPHAGE) 850 MG tablet Take 1 tablet (850 mg total) by mouth daily with breakfast. 90 tablet 1  . omeprazole (PRILOSEC) 20 MG capsule Take 1 capsule (20 mg total) by mouth daily. 90 capsule 2  . simvastatin (ZOCOR) 10 MG tablet Take 1 tablet (10 mg total) by mouth at bedtime. 90 tablet 3  . TRUEplus Lancets 28G MISC  28 g by Does not apply route QID. 120 each 3   No facility-administered medications prior to visit.    No Known Allergies     Objective:    BP 105/71   Pulse 63   Temp 97.7 F (36.5 C)   Resp 16   Wt (!) 386 lb 9.6 oz (175.4 kg)   SpO2 99%   BMI 51.01 kg/m  Wt Readings from Last 3 Encounters:  08/16/19 (!) 386 lb 9.6 oz (175.4 kg)  02/14/19 (!) 392 lb (177.8 kg)  02/20/18 (!) 352 lb 3.2 oz (159.8 kg)    Physical Exam Vitals and nursing note reviewed.  Constitutional:      Appearance: He is well-developed.  HENT:     Head: Normocephalic and atraumatic.  Cardiovascular:     Rate and Rhythm: Normal rate and regular rhythm.     Heart sounds: Normal heart sounds. No murmur heard.  No friction rub. No gallop.   Pulmonary:     Effort: Pulmonary effort is normal. No tachypnea or respiratory distress.     Breath sounds: Normal breath sounds. No decreased breath sounds, wheezing, rhonchi or rales.  Chest:     Chest wall: No tenderness.  Abdominal:     General: Bowel sounds are normal.     Palpations: Abdomen is soft.  Musculoskeletal:        General: Normal range of motion.     Cervical back: Normal range of motion.  Skin:    General: Skin is warm and dry.  Neurological:     Mental Status: He is alert and oriented to person, place, and time.     Coordination: Coordination normal.  Psychiatric:        Behavior: Behavior normal. Behavior is cooperative.        Thought Content: Thought content normal.        Judgment: Judgment normal.          Patient has been counseled extensively about nutrition and exercise as well as the importance of adherence with medications and regular follow-up. The patient was given clear instructions to go to ER or return to medical center if symptoms don't improve, worsen or new problems develop. The patient verbalized understanding.   Follow-up: Return for labs monday or tuesday morning. See me in 3 months.   Gildardo Pounds,  FNP-BC [facility] [place], [place]  08/16/2019, 4:47 PM

## 2019-08-17 LAB — MICROALBUMIN / CREATININE URINE RATIO
Creatinine, Urine: 234.3 mg/dL
Microalb/Creat Ratio: 3 mg/g creat (ref 0–29)
Microalbumin, Urine: 7.4 ug/mL

## 2019-08-19 ENCOUNTER — Other Ambulatory Visit: Payer: Self-pay | Admitting: Pharmacist

## 2019-08-19 MED ORDER — FREESTYLE LANCETS MISC
2 refills | Status: DC
Start: 2019-08-19 — End: 2019-09-19

## 2019-08-19 MED FILL — FREESTYLE LANCETS: 50 days supply | Qty: 100 | Fill #0

## 2019-08-20 ENCOUNTER — Other Ambulatory Visit: Payer: Self-pay | Admitting: Pharmacist

## 2019-08-20 MED ORDER — FREESTYLE LITE TEST VI STRP
ORAL_STRIP | 2 refills | Status: DC
Start: 2019-08-20 — End: 2019-09-19

## 2019-08-20 MED ORDER — FREESTYLE LITE DEVI: 0 refills | Status: DC

## 2019-08-20 MED FILL — FREESTYLE LITE METER: 1 days supply | Qty: 1 | Fill #0

## 2019-08-20 MED FILL — FREESTYLE LITE TEST STRIP: 50 days supply | Qty: 100 | Fill #0

## 2019-09-12 ENCOUNTER — Ambulatory Visit: Payer: Self-pay | Admitting: *Deleted

## 2019-09-12 NOTE — Telephone Encounter (Signed)
Patient calling with complaints of rectal pain x 1 week. Pt describes the pain as a "pulsating" type of pain that comes and goes. Pt states he experiences the pain mostly with wiping after a BM and if standing and moving around. Pt states that it is painful to pass gas. Pt states that he did have some abdominal pain but none currently.Pt denies any bleeding or hx of hemorrhoids, but states rectum feels swollen.Pt believes the pain is due to constipation since starting Phentermine in June. Pt states he usually is regular and has a BM once or twice a day until the past week. Pt states that he has been drinking plenty of fluids.Pt states that he took magnesium citrate 2-3 days ago and has had liquid and small pieces of BM since then. Pt states prior to taking magnesium citrate he tried dulcolax chewables with little relief. No appts available with PCP or other providers in the office with 24 hours. Earliest appt available on 7/28. Pt advised to seek treatment at Urgent Care/ED for current symptoms. Pt verbalized understanding.  Reason for Disposition  MODERATE-SEVERE rectal pain (i.e., interferes with school, work, or sleep)  Answer Assessment - Initial Assessment Questions 1. SYMPTOM:  "What's the main symptom you're concerned about?" (e.g., pain, itching, swelling, rash)     Pain with wiping and did have some itching 2. ONSET: "When did the pain  start?"     Pain with wiping 3. RECTAL PAIN: "Do you have any pain around your rectum?" "How bad is the pain?"  (Scale 1-10; or mild, moderate, severe)  - MILD (1-3): doesn't interfere with normal activities   - MODERATE (4-7): interferes with normal activities or awakens from sleep, limping   - SEVERE (8-10): excruciating pain, unable to have a bowel movement      After wiping about a 7, with sitting down 4. RECTAL ITCHING: "Do you have any itching in this area?" "How bad is the itching?"  (Scale 1-10; or mild, moderate, severe)  - MILD - doesn't interfere  with normal activities   - MODERATE-SEVERE: interferes with normal activities or awakens from sleep     Rectal itching for a little bit, hydrocortisone cream did ease it  5. CONSTIPATION: "Do you have constipation?" If Yes, ask: "How bad is it?"     Yes and took mag  6. CAUSE: "What do you think is causing the anus symptoms?" Constipation from Phentermine  7. OTHER SYMPTOMS: "Do you have any other symptoms?"  (e.g., rectal bleeding, abdominal pain, vomiting, fever)     Not currently but has been in the past 8. PREGNANCY: "Is there any chance you are pregnant?" "When was your last menstrual period?"     n/a  Protocols used: RECTAL [facility]

## 2019-09-19 ENCOUNTER — Other Ambulatory Visit: Payer: Self-pay | Admitting: Nurse Practitioner

## 2019-09-19 MED ORDER — FREESTYLE LITE TEST VI STRP: ORAL_STRIP | 2 refills | Status: DC

## 2019-09-19 MED ORDER — FREESTYLE LANCETS MISC: 2 refills | Status: DC

## 2019-09-19 NOTE — Telephone Encounter (Signed)
Medication Refill - Medication: Blood Glucose Monitoring Suppl (FREESTYLE LITE) DEVICE/ meter   glucose blood (FREESTYLE LITE) test strip   Has the patient contacted their pharmacy? Yes.   (Agent: If no, request that the patient contact the pharmacy for the refill.) (Agent: If yes, when and what did the pharmacy advise?)  Preferred Pharmacy (with phone number or street name):  [facility] - [place], [place] - [address] [facility].  [address] [place] [address]  Phone:  [phone] Fax:  [phone]  Agent: Please be advised that RX refills may take up to 3 business days. We ask that you follow-up with your pharmacy.

## 2019-10-01 MED FILL — PHENTERMINE 37.5 MG TABLET: 37.5 | 30 days supply | Qty: 30 | Fill #1

## 2019-10-01 MED FILL — FREESTYLE LITE TEST STRIP: 50 days supply | Qty: 100 | Fill #1

## 2019-10-01 MED FILL — LEVOTHYROXINE SODIUM 200 MC: 200 | 30 days supply | Qty: 30 | Fill #1

## 2019-10-04 MED FILL — ATENOLOL 50 MG TABLET: 50 | 30 days supply | Qty: 30 | Fill #0

## 2019-10-31 MED FILL — LEVOTHYROXINE SODIUM 200 MC: 200 | 30 days supply | Qty: 30 | Fill #2

## 2019-10-31 MED FILL — ATENOLOL 50 MG TABLET: 50 | 30 days supply | Qty: 30 | Fill #1

## 2019-11-20 MED FILL — MetFORMIN HCL 850 MG TAB: 850 | 90 days supply | Qty: 90 | Fill #1

## 2019-11-21 ENCOUNTER — Ambulatory Visit: Payer: No Typology Code available for payment source | Attending: Nurse Practitioner

## 2019-11-21 ENCOUNTER — Other Ambulatory Visit: Payer: Self-pay

## 2019-11-21 DIAGNOSIS — E038 Other specified hypothyroidism: Secondary | ICD-10-CM

## 2019-11-21 DIAGNOSIS — E291 Testicular hypofunction: Secondary | ICD-10-CM

## 2019-11-21 NOTE — Telephone Encounter (Signed)
Pt. Request TSH medication refill.

## 2019-11-22 LAB — TESTOSTERONE: Testosterone: 296 ng/dL (ref 264–916)

## 2019-11-22 MED ORDER — LEVOTHYROXINE SODIUM 200 MCG PO TABS: 200.0000 ug | ORAL_TABLET | Freq: Every day | ORAL | 2 refills | Status: DC

## 2019-11-25 ENCOUNTER — Telehealth: Payer: Self-pay

## 2019-11-25 DIAGNOSIS — K2100 Gastro-esophageal reflux disease with esophagitis, without bleeding: Secondary | ICD-10-CM

## 2019-11-25 DIAGNOSIS — E038 Other specified hypothyroidism: Secondary | ICD-10-CM

## 2019-11-25 DIAGNOSIS — Z87898 Personal history of other specified conditions: Secondary | ICD-10-CM

## 2019-11-25 NOTE — Telephone Encounter (Signed)
Patient would like PCP suggestion on stop smoking. Patient would like to know the options.

## 2019-11-25 NOTE — Telephone Encounter (Signed)
Nicotine patches, Wellbutrin or Chantix which are both pills.  If nicotine patches I will need to know how man cigarettes he smokes per day.

## 2019-11-26 MED ORDER — ATENOLOL 50 MG PO TABS: 50.0000 mg | ORAL_TABLET | Freq: Every day | ORAL | 0 refills | Status: DC

## 2019-11-26 MED ORDER — OMEPRAZOLE 20 MG PO CPDR: 20.0000 mg | DELAYED_RELEASE_CAPSULE | Freq: Every day | ORAL | 0 refills | Status: DC

## 2019-11-26 MED FILL — OMEPRAZOLE DR 20 MG CAPSULE: 20 | 30 days supply | Qty: 30 | Fill #0

## 2019-11-26 MED FILL — ATENOLOL 50 MG TABLET: 50 | 30 days supply | Qty: 30 | Fill #0

## 2019-11-26 NOTE — Telephone Encounter (Signed)
States he smokes 10 -12 cigarettes.  He would prefer trying the patch  first. If this does not help he will transition to the pills. Pls send medicaton to [facility].

## 2019-11-27 ENCOUNTER — Other Ambulatory Visit: Payer: Self-pay | Admitting: Nurse Practitioner

## 2019-11-27 MED ORDER — NICOTINE 21-14-7 MG/24HR TD KIT: PACK | TRANSDERMAL | 0 refills | Status: DC

## 2019-11-27 MED ORDER — LEVOTHYROXINE SODIUM 200 MCG PO TABS: 200.0000 ug | ORAL_TABLET | Freq: Every day | ORAL | 2 refills | Status: DC

## 2019-11-27 MED FILL — NICOTINE 21 MG/24HR PATCH: 21 | 28 days supply | Qty: 28 | Fill #0

## 2019-11-27 MED FILL — LEVOTHYROXINE SODIUM 200 MC: 200 | 30 days supply | Qty: 30 | Fill #0

## 2019-11-27 NOTE — Telephone Encounter (Signed)
Spoke to patient and informed on Rx.  Pt. Request TSH medication to be resend to outpatient pharmacy. CMA will resend it.

## 2019-11-27 NOTE — Telephone Encounter (Signed)
Sent to [facility]

## 2019-12-11 ENCOUNTER — Ambulatory Visit
Admission: EM | Admit: 2019-12-11 | Discharge: 2019-12-11 | Disposition: A | Discharge status: Home / Self Care | Payer: No Typology Code available for payment source | Attending: Emergency Medicine | Admitting: Emergency Medicine

## 2019-12-11 ENCOUNTER — Other Ambulatory Visit: Payer: Self-pay

## 2019-12-11 ENCOUNTER — Encounter: Payer: Self-pay | Admitting: Emergency Medicine

## 2019-12-11 ENCOUNTER — Other Ambulatory Visit: Payer: Self-pay | Admitting: Emergency Medicine

## 2019-12-11 DIAGNOSIS — L0501 Pilonidal cyst with abscess: Secondary | ICD-10-CM

## 2019-12-11 MED ORDER — CLINDAMYCIN HCL 300 MG PO CAPS: 300.0000 mg | ORAL_CAPSULE | Freq: Three times a day (TID) | ORAL | 0 refills | Status: DC

## 2019-12-11 MED ORDER — IBUPROFEN 800 MG PO TABS: 800.0000 mg | ORAL_TABLET | Freq: Three times a day (TID) | ORAL | 0 refills | Status: DC

## 2019-12-11 MED ORDER — HYDROCODONE-ACETAMINOPHEN 5-325 MG PO TABS: 1.0000 | ORAL_TABLET | Freq: Four times a day (QID) | ORAL | 0 refills | Status: DC | PRN

## 2019-12-11 MED FILL — CLINDAMYCIN HCL 300 MG CAPS: 300 | 7 days supply | Qty: 21 | Fill #0

## 2019-12-11 MED FILL — IBUPROFEN 800 MG TABS: 800 | 7 days supply | Qty: 21 | Fill #0

## 2019-12-11 MED FILL — HYDROCODON-APAP 5-325: 5-325 | 1 days supply | Qty: 8 | Fill #0

## 2019-12-11 NOTE — Discharge Instructions (Addendum)
Begin clindamycin every 8 hours for the next week Ibuprofen and Tylenol for mild to moderate pain Hydrocodone for severe pain-do not drive or work after taking, use sparingly Warm compresses and soaks around area Packing to be removed in 24 to 48 hours, may remove at home or follow-up here for packing removal/recheck of abscess Return if not improving or worsening

## 2019-12-11 NOTE — ED Triage Notes (Signed)
Patient arrived by self from home.   Patient c/o abscess on RT side of buttocks.   Patient stated it started 4 days ago. Patient has pain 10/10 w/ movement.

## 2019-12-11 NOTE — ED Provider Notes (Signed)
[facility]    CSN: [id number] Arrival date & time: 12/11/19  1137      History   Chief Complaint Chief Complaint  Patient presents with   Abscess    HPI Austin Beasley is a 39 y.o. male history of DM type II, presenting today for evaluation of abscess.  Patient has had abscess to his right buttock area over the past 4 days.  Reports pain significant and continues to worsen.  Has had prior abscess in this area which spontaneously drained, but has not had any drainage with this plan.  Denies fevers.  Reports sugars have been stable.  Denies rectal pain.  HPI  Past Medical History:  Diagnosis Date   Diabetes mellitus without complication ([place])    Obesity    Palpitations    Thyroid disease     Patient Active Problem List   Diagnosis Date Noted   Class 3 severe obesity with serious comorbidity and body mass index (BMI) of 50.0 to 59.9 in adult [facility]) 05/16/2019   Type 2 diabetes mellitus with hyperglycemia, without long-term current use of insulin ([place]) 08/07/2018   Diabetes mellitus without complication ([place]) 42/59/5638    Past Surgical History:  Procedure Laterality Date   ELEVATION OF DEPRESSED SKULL FRACTURE     age 48       Home Medications    Prior to Admission medications   Medication Sig Start Date End Date Taking? Authorizing Provider  atenolol (TENORMIN) 50 MG tablet Take 1 tablet (50 mg total) by mouth daily. 11/26/19  Yes Gildardo Pounds, NP  Blood Glucose Monitoring Suppl (FREESTYLE LITE) DEVI Use as instructed. Check blood glucose level by fingerstick twice per day.  E11.65 08/20/19  Yes [place], Charlane Ferretti, MD  glucose blood (FREESTYLE LITE) test strip Use as instructed. Check blood glucose level by fingerstick twice per day.  E11.65 09/19/19  Yes Charlott Rakes, MD  Lancets (FREESTYLE) lancets Use as instructed. Check blood glucose level by fingerstick twice per day.  E11.65 09/19/19  Yes Charlott Rakes, MD  levothyroxine (SYNTHROID)  200 MCG tablet Take 1 tablet (200 mcg total) by mouth daily before breakfast. 11/27/19 12/27/19 Yes Gildardo Pounds, NP  omeprazole (PRILOSEC) 20 MG capsule Take 1 capsule (20 mg total) by mouth daily. 11/26/19  Yes Gildardo Pounds, NP  simvastatin (ZOCOR) 10 MG tablet Take 1 tablet (10 mg total) by mouth at bedtime. 08/16/19  Yes Gildardo Pounds, NP  clindamycin (CLEOCIN) 300 MG capsule Take 1 capsule (300 mg total) by mouth 3 (three) times daily for 7 days. 12/11/19 12/18/19  Tyri Elmore C, PA-C  cyclobenzaprine (FLEXERIL) 10 MG tablet Take 1 tablet (10 mg total) by mouth 3 (three) times daily as needed for muscle spasms. 06/16/19   Gildardo Pounds, NP  gabapentin (NEURONTIN) 300 MG capsule Take 1 capsule (300 mg total) by mouth 3 (three) times daily. 08/16/19 09/15/19  Gildardo Pounds, NP  HYDROcodone-acetaminophen (NORCO/VICODIN) 5-325 MG tablet Take 1-2 tablets by mouth every 6 (six) hours as needed. 12/11/19   Xander Jutras C, PA-C  ibuprofen (ADVIL) 800 MG tablet Take 1 tablet (800 mg total) by mouth 3 (three) times daily. 12/11/19   Azani Brogdon C, PA-C  metFORMIN (GLUCOPHAGE) 850 MG tablet Take 1 tablet (850 mg total) by mouth daily with breakfast. 08/16/19 11/14/19  Gildardo Pounds, NP  Nicotine 21-14-7 MG/24HR KIT 62m patch for 4 weeks; 14 mg patch for 2 weeks; 7 mg patch for 2 weeks. Place  one patch on skin for 24 hours. 11/27/19   Gildardo Pounds, NP  phentermine (ADIPEX-P) 37.5 MG tablet Take 1 tablet (37.5 mg total) by mouth daily before breakfast. 08/16/19 09/15/19  Gildardo Pounds, NP    Family History Family History  Problem Relation Age of Onset   Diabetes Mother    Cancer Mother    Hypertension Father    Cancer Sister    Birth defects Maternal Grandmother    Birth defects Maternal Grandfather    Birth defects Paternal Grandmother    Birth defects Paternal Grandfather     Social History Social History   Tobacco Use   Smoking status: Current Every Day  Smoker    Types: Cigarettes   Smokeless tobacco: Never Used  Scientific laboratory technician Use: Never used  Substance Use Topics   Alcohol use: Yes    Comment: occ   Drug use: Yes    Types: Marijuana    Comment: occ     Allergies   Patient has no known allergies.   Review of Systems Review of Systems  Constitutional: Negative for fatigue and fever.  Eyes: Negative for redness, itching and visual disturbance.  Respiratory: Negative for shortness of breath.   Cardiovascular: Negative for chest pain and leg swelling.  Gastrointestinal: Negative for nausea and vomiting.  Musculoskeletal: Negative for arthralgias and myalgias.  Skin: Positive for color change and wound. Negative for rash.  Neurological: Negative for dizziness, syncope, weakness, light-headedness and headaches.     Physical Exam Triage Vital Signs ED Triage Vitals  Enc Vitals Group     BP 12/11/19 1157 133/90     Pulse Rate 12/11/19 1157 61     Resp 12/11/19 1157 18     Temp 12/11/19 1157 97.9 F (36.6 C)     Temp Source 12/11/19 1157 Oral     SpO2 12/11/19 1157 95 %     Weight 12/11/19 1155 (!) 386 lb 11 oz (175.4 kg)     Height 12/11/19 1155 '6\' 1"'  (1.854 m)     Head Circumference --      Peak Flow --      Pain Score 12/11/19 1154 10     Pain Loc --      Pain Edu? --      Excl. in [place]? --    No data found.  Updated Vital Signs BP 133/90 (BP Location: Left Arm)    Pulse 61    Temp 97.9 F (36.6 C) (Oral)    Resp 18    Ht '6\' 1"'  (1.854 m)    Wt (!) 386 lb 11 oz (175.4 kg)    SpO2 95%    BMI 51.02 kg/m   Visual Acuity Right Eye Distance:   Left Eye Distance:   Bilateral Distance:    Right Eye Near:   Left Eye Near:    Bilateral Near:     Physical Exam Vitals and nursing note reviewed.  Constitutional:      Appearance: He is well-developed.     Comments: No acute distress  HENT:     Head: Normocephalic and atraumatic.     Nose: Nose normal.  Eyes:     Conjunctiva/sclera: Conjunctivae normal.   Cardiovascular:     Rate and Rhythm: Normal rate.  Pulmonary:     Effort: Pulmonary effort is normal. No respiratory distress.  Abdominal:     General: There is no distension.  Musculoskeletal:        General:  Normal range of motion.     Cervical back: Neck supple.     Comments: 3 cm area of induration with central fluctuance and erythema with skin peeling to right upper gluteal area/pilonidal area  Skin:    General: Skin is warm and dry.  Neurological:     Mental Status: He is alert and oriented to person, place, and time.      UC Treatments / Results  Labs (all labs ordered are listed, but only abnormal results are displayed) Labs Reviewed - No data to display  EKG   Radiology No results found.  Procedures Incision and Drainage  Date/Time: 12/11/2019 12:38 PM Performed by: Kordel Leavy, [place] C, PA-C Authorized by: Jenene Kauffmann, Elesa Hacker, PA-C   Consent:    Consent obtained:  Verbal   Consent given by:  Patient   Risks discussed:  Incomplete drainage, pain, damage to other organs and bleeding Location:    Type:  Abscess   Location:  Anogenital   Anogenital location:  Pilonidal Pre-procedure details:    Skin preparation:  Betadine Anesthesia (see MAR for exact dosages):    Anesthesia method:  Local infiltration   Local anesthetic:  Lidocaine 2% WITH epi Procedure type:    Complexity:  Simple Procedure details:    Incision types:  Single straight   Incision depth:  Subcutaneous   Scalpel blade:  11   Wound management:  Probed and deloculated   Drainage:  Purulent and bloody   Drainage amount:  Copious   Wound treatment:  Wound left open   Packing materials:  1/4 in iodoform gauze Post-procedure details:    Patient tolerance of procedure:  Tolerated well, no immediate complications   (including critical care time)  Medications Ordered in UC Medications - No data to display  Initial Impression / Assessment and Plan / UC Course  I have reviewed the triage  vital signs and the nursing notes.  Pertinent labs & imaging results that were available during my care of the patient were reviewed by me and considered in my medical decision making (see chart for details).     Pilonidal abscess, vital signs stable, I&D performed, packing placed.  Packing to be removed in 24 to 48 hours.  Initiating on clindamycin, ibuprofen and Tylenol for mild to moderate pain.  Hydrocodone for severe pain.  Warm compresses and sitz bath's to further help with healing and drainage of cyst.  Discussed strict return precautions. Patient verbalized understanding and is agreeable with plan.  Final Clinical Impressions(s) / UC Diagnoses   Final diagnoses:  Pilonidal abscess     Discharge Instructions     Begin clindamycin every 8 hours for the next week Ibuprofen and Tylenol for mild to moderate pain Hydrocodone for severe pain-do not drive or work after taking, use sparingly Warm compresses and soaks around area Packing to be removed in 24 to 48 hours, may remove at home or follow-up here for packing removal/recheck of abscess Return if not improving or worsening    ED Prescriptions    Medication Sig Dispense Auth. Provider   clindamycin (CLEOCIN) 300 MG capsule Take 1 capsule (300 mg total) by mouth 3 (three) times daily for 7 days. 21 capsule Rubye Strohmeyer C, PA-C   ibuprofen (ADVIL) 800 MG tablet Take 1 tablet (800 mg total) by mouth 3 (three) times daily. 21 tablet Keneth Borg C, PA-C   HYDROcodone-acetaminophen (NORCO/VICODIN) 5-325 MG tablet Take 1-2 tablets by mouth every 6 (six) hours as needed. 8 tablet Derry Arbogast, [place]  C, PA-C     I have reviewed the PDMP during this encounter.   Shrihaan Porzio, [place] C, PA-C 12/11/19 1239

## 2019-12-12 ENCOUNTER — Ambulatory Visit: Payer: No Typology Code available for payment source | Admitting: Internal Medicine

## 2019-12-27 MED FILL — LEVOTHYROXINE SODIUM 200 MC: 200 | 30 days supply | Qty: 30 | Fill #1

## 2019-12-27 MED FILL — ATENOLOL 50 MG TABLET: 50 | 90 days supply | Qty: 90 | Fill #0

## 2020-01-01 ENCOUNTER — Encounter: Payer: Self-pay | Admitting: Nurse Practitioner

## 2020-01-01 ENCOUNTER — Other Ambulatory Visit: Payer: Self-pay | Admitting: Nurse Practitioner

## 2020-01-01 ENCOUNTER — Ambulatory Visit: Payer: No Typology Code available for payment source | Attending: Nurse Practitioner | Admitting: Nurse Practitioner

## 2020-01-01 ENCOUNTER — Other Ambulatory Visit: Payer: Self-pay

## 2020-01-01 VITALS — BP 103/63 | HR 63 | Temp 97.7°F | Ht 73.0 in | Wt 375.0 lb

## 2020-01-01 DIAGNOSIS — Z114 Encounter for screening for human immunodeficiency virus [HIV]: Secondary | ICD-10-CM

## 2020-01-01 DIAGNOSIS — E1165 Type 2 diabetes mellitus with hyperglycemia: Secondary | ICD-10-CM

## 2020-01-01 DIAGNOSIS — E039 Hypothyroidism, unspecified: Secondary | ICD-10-CM

## 2020-01-01 DIAGNOSIS — E038 Other specified hypothyroidism: Secondary | ICD-10-CM

## 2020-01-01 DIAGNOSIS — K5903 Drug induced constipation: Secondary | ICD-10-CM

## 2020-01-01 DIAGNOSIS — E785 Hyperlipidemia, unspecified: Secondary | ICD-10-CM

## 2020-01-01 DIAGNOSIS — Z87898 Personal history of other specified conditions: Secondary | ICD-10-CM

## 2020-01-01 DIAGNOSIS — Z6841 Body Mass Index (BMI) 40.0 and over, adult: Secondary | ICD-10-CM

## 2020-01-01 DIAGNOSIS — Z1159 Encounter for screening for other viral diseases: Secondary | ICD-10-CM

## 2020-01-01 DIAGNOSIS — K2100 Gastro-esophageal reflux disease with esophagitis, without bleeding: Secondary | ICD-10-CM

## 2020-01-01 LAB — GLUCOSE, POCT (MANUAL RESULT ENTRY): POC Glucose: 96 mg/dl (ref 70–99)

## 2020-01-01 MED ORDER — SIMVASTATIN 10 MG PO TABS: 10.0000 mg | ORAL_TABLET | Freq: Every day | ORAL | 3 refills | Status: DC

## 2020-01-01 MED ORDER — ATENOLOL 50 MG PO TABS: 50.0000 mg | ORAL_TABLET | Freq: Every day | ORAL | 1 refills | Status: DC

## 2020-01-01 MED ORDER — OMEPRAZOLE 20 MG PO CPDR: 20.0000 mg | DELAYED_RELEASE_CAPSULE | Freq: Every day | ORAL | 1 refills | Status: DC

## 2020-01-01 MED ORDER — FREESTYLE LANCETS MISC: 2 refills | Status: DC

## 2020-01-01 MED ORDER — METFORMIN HCL 850 MG PO TABS: 850.0000 mg | ORAL_TABLET | Freq: Every day | ORAL | 1 refills | Status: DC

## 2020-01-01 MED ORDER — GABAPENTIN 300 MG PO CAPS: 300.0000 mg | ORAL_CAPSULE | Freq: Three times a day (TID) | ORAL | 1 refills | Status: DC

## 2020-01-01 MED ORDER — FREESTYLE LITE TEST VI STRP: ORAL_STRIP | 2 refills | Status: DC

## 2020-01-01 MED ORDER — LEVOTHYROXINE SODIUM 200 MCG PO TABS: 200.0000 ug | ORAL_TABLET | Freq: Every day | ORAL | 2 refills | Status: DC

## 2020-01-01 MED ORDER — SENNOSIDES-DOCUSATE SODIUM 8.6-50 MG PO TABS: 2.0000 | ORAL_TABLET | Freq: Two times a day (BID) | ORAL | 1 refills | Status: DC | PRN

## 2020-01-01 MED FILL — SIMVASTATIN 10 MG TABLET: 10 | 90 days supply | Qty: 90 | Fill #0

## 2020-01-01 MED FILL — FREESTYLE LANCETS: 50 days supply | Qty: 100 | Fill #0

## 2020-01-01 MED FILL — GABAPENTIN 300 MG CAPSULE: 300 | 90 days supply | Qty: 270 | Fill #0

## 2020-01-01 MED FILL — OMEPRAZOLE DR 20 MG CAPSULE: 20 | 90 days supply | Qty: 90 | Fill #0

## 2020-01-01 MED FILL — FREESTYLE LITE TEST STRIP: 50 days supply | Qty: 100 | Fill #0

## 2020-01-01 NOTE — Progress Notes (Signed)
Assessment & Plan:  Austin Beasley was seen today for follow-up.  Diagnoses and all orders for this visit:  Type 2 diabetes mellitus with hyperglycemia, without long-term current use of insulin (HCC) -     Glucose (CBG) -     Basic metabolic panel -     metFORMIN (GLUCOPHAGE) 850 MG tablet; Take 1 tablet (850 mg total) by mouth daily with breakfast. -     gabapentin (NEURONTIN) 300 MG capsule; Take 1 capsule (300 mg total) by mouth 3 (three) times daily. -     glucose blood (FREESTYLE LITE) test strip; Use as instructed. Check blood glucose level by fingerstick twice per day.  E11.65 -     Lancets (FREESTYLE) lancets; Use as instructed. Check blood glucose level by fingerstick twice per day.  E11.65 Continue blood sugar control as discussed in office today, low carbohydrate diet, and regular physical exercise as tolerated, 150 minutes per week (30 min each day, 5 days per week, or 50 min 3 days per week). Keep blood sugar logs with fasting goal of 90-130 mg/dl, post prandial (after you eat) less than 180.  For Hypoglycemia: BS <60 and Hyperglycemia BS >400; contact the clinic ASAP. Annual eye exams and foot exams are recommended.   Dyslipidemia, goal LDL below 70 -     simvastatin (ZOCOR) 10 MG tablet; Take 1 tablet (10 mg total) by mouth at bedtime. INSTRUCTIONS: Work on a low fat, heart healthy diet and participate in regular aerobic exercise program by working out at least 150 minutes per week; 5 days a week-30 minutes per day. Avoid red meat/beef/steak,  fried foods. junk foods, sodas, sugary drinks, unhealthy snacking, alcohol and smoking.  Drink at least 80 oz of water per day and monitor your carbohydrate intake daily.    Hypothyroidism, unspecified type -     TSH  Encounter for screening for HIV -     HIV antibody (with reflex)  History of palpitations -     atenolol (TENORMIN) 50 MG tablet; Take 1 tablet (50 mg total) by mouth daily.  Gastroesophageal reflux disease with  esophagitis without hemorrhage -     omeprazole (PRILOSEC) 20 MG capsule; Take 1 capsule (20 mg total) by mouth daily. INSTRUCTIONS: Avoid GERD Triggers: acidic, spicy or fried foods, caffeine, coffee, sodas,  alcohol and chocolate.   Need for hepatitis C screening test -     Hepatitis C Antibody  Constipation due to pain medication -     senna-docusate (SENOKOT-S) 8.6-50 MG tablet; Take 2 tablets by mouth 2 (two) times daily as needed for mild constipation or moderate constipation.   Morbid Obesity Weight is down. Phentermine has helped with weight loss in the past however he does note that it did cause constipation when he was taking it.   Patient has been counseled on age-appropriate routine health concerns for screening and prevention. These are reviewed and up-to-date. Referrals have been placed accordingly. Immunizations are up-to-date or declined.    Subjective:   Chief Complaint  Patient presents with   Follow-up    Pt. is here for a follow up for diabetes.    HPI Austin Beasley 39 y.o. male presents to office today for follow up.  has a past medical history of Diabetes mellitus without complication ([place]), Obesity, Palpitations, and Thyroid disease.   DM TYPE 2 Well controlled with metformin 850 mg daily. He has his meter with him today. Average 7 day glucose level:121. He takes gabapentin for peripheral neuropathy.  LDL not quite at goal with simvastatin 10 mg daily.  Lab Results  Component Value Date   HGBA1C 6.2 08/16/2019   Lab Results  Component Value Date   [place] 80 06/13/2019   Hypothyroidism Thyroid levels have been well controlled in the past. He is currently taking synthroid 200 mg daily.   Review of Systems  Constitutional: Negative for fever, malaise/fatigue and weight loss.  HENT: Negative.  Negative for nosebleeds.   Eyes: Negative.  Negative for blurred vision, double vision and photophobia.  Respiratory: Negative.  Negative for cough and  shortness of breath.   Cardiovascular: Positive for palpitations (controlled with BB). Negative for chest pain and leg swelling.  Gastrointestinal: Positive for constipation (with taking phentermine) and heartburn. Negative for nausea and vomiting.  Musculoskeletal: Negative.  Negative for myalgias.  Neurological: Negative.  Negative for dizziness, focal weakness, seizures and headaches.  Psychiatric/Behavioral: Negative.  Negative for suicidal ideas.    Past Medical History:  Diagnosis Date   Diabetes mellitus without complication ([place])    Obesity    Palpitations    Thyroid disease     Past Surgical History:  Procedure Laterality Date   ELEVATION OF DEPRESSED SKULL FRACTURE     age 16    Family History  Problem Relation Age of Onset   Diabetes Mother    Cancer Mother    Hypertension Father    Cancer Sister    Birth defects Maternal Grandmother    Birth defects Maternal Grandfather    Birth defects Paternal Grandmother    Birth defects Paternal Grandfather     Social History Reviewed with no changes to be made today.   Outpatient Medications Prior to Visit  Medication Sig Dispense Refill   Blood Glucose Monitoring Suppl (FREESTYLE LITE) DEVI Use as instructed. Check blood glucose level by fingerstick twice per day.  E11.65 1 each 0   cyclobenzaprine (FLEXERIL) 10 MG tablet Take 1 tablet (10 mg total) by mouth 3 (three) times daily as needed for muscle spasms. 30 tablet 0   HYDROcodone-acetaminophen (NORCO/VICODIN) 5-325 MG tablet Take 1-2 tablets by mouth every 6 (six) hours as needed. 8 tablet 0   ibuprofen (ADVIL) 800 MG tablet Take 1 tablet (800 mg total) by mouth 3 (three) times daily. 21 tablet 0   atenolol (TENORMIN) 50 MG tablet Take 1 tablet (50 mg total) by mouth daily. 30 tablet 0   glucose blood (FREESTYLE LITE) test strip Use as instructed. Check blood glucose level by fingerstick twice per day.  E11.65 100 each 2   omeprazole (PRILOSEC) 20  MG capsule Take 1 capsule (20 mg total) by mouth daily. 30 capsule 0   simvastatin (ZOCOR) 10 MG tablet Take 1 tablet (10 mg total) by mouth at bedtime. 90 tablet 3   Nicotine 21-14-7 MG/24HR KIT 58m patch for 4 weeks; 14 mg patch for 2 weeks; 7 mg patch for 2 weeks. Place one patch on skin for 24 hours. (Patient not taking: Reported on 01/01/2020) 1 kit 0   phentermine (ADIPEX-P) 37.5 MG tablet Take 1 tablet (37.5 mg total) by mouth daily before breakfast. 30 tablet 1   gabapentin (NEURONTIN) 300 MG capsule Take 1 capsule (300 mg total) by mouth 3 (three) times daily. 90 capsule 3   Lancets (FREESTYLE) lancets Use as instructed. Check blood glucose level by fingerstick twice per day.  E11.65 100 each 2   levothyroxine (SYNTHROID) 200 MCG tablet Take 1 tablet (200 mcg total) by mouth daily before breakfast. 30  tablet 2   metFORMIN (GLUCOPHAGE) 850 MG tablet Take 1 tablet (850 mg total) by mouth daily with breakfast. 90 tablet 1   No facility-administered medications prior to visit.    No Known Allergies     Objective:    BP 103/63 (BP Location: Left Arm, Patient Position: Sitting, Cuff Size: Large) Comment (Cuff Size): thigh cuff   Pulse 63    Temp 97.7 F (36.5 C) (Temporal)    Ht '6\' 1"'  (1.854 m)    Wt (!) 375 lb (170.1 kg)    SpO2 93%    BMI 49.48 kg/m  Wt Readings from Last 3 Encounters:  01/01/20 (!) 375 lb (170.1 kg)  12/11/19 (!) 386 lb 11 oz (175.4 kg)  08/16/19 (!) 386 lb 9.6 oz (175.4 kg)    Physical Exam Vitals and nursing note reviewed.  Constitutional:      Appearance: He is well-developed.  HENT:     Head: Normocephalic and atraumatic.  Cardiovascular:     Rate and Rhythm: Normal rate and regular rhythm.     Heart sounds: Normal heart sounds. No murmur heard.  No friction rub. No gallop.   Pulmonary:     Effort: Pulmonary effort is normal. No tachypnea or respiratory distress.     Breath sounds: Normal breath sounds. No decreased breath sounds, wheezing,  rhonchi or rales.  Chest:     Chest wall: No tenderness.  Abdominal:     General: Bowel sounds are normal.     Palpations: Abdomen is soft.  Musculoskeletal:        General: Normal range of motion.     Cervical back: Normal range of motion.  Skin:    General: Skin is warm and dry.  Neurological:     Mental Status: He is alert and oriented to person, place, and time.     Coordination: Coordination normal.  Psychiatric:        Behavior: Behavior normal. Behavior is cooperative.        Thought Content: Thought content normal.        Judgment: Judgment normal.          Patient has been counseled extensively about nutrition and exercise as well as the importance of adherence with medications and regular follow-up. The patient was given clear instructions to go to ER or return to medical center if symptoms don't improve, worsen or new problems develop. The patient verbalized understanding.   Follow-up: Return in about 3 months (around 04/02/2020).   Gildardo Pounds, FNP-BC [facility] [place], [place]   01/06/2020, 6:47 PM

## 2020-01-02 LAB — BASIC METABOLIC PANEL
BUN/Creatinine Ratio: 13 (ref 9–20)
BUN: 14 mg/dL (ref 6–24)
CO2: 22 mmol/L (ref 20–29)
Calcium: 9.5 mg/dL (ref 8.7–10.2)
Chloride: 104 mmol/L (ref 96–106)
Creatinine, Ser: 1.08 mg/dL (ref 0.76–1.27)
GFR calc Af Amer: 99 mL/min/{1.73_m2} (ref >59)
GFR calc non Af Amer: 85 mL/min/{1.73_m2} (ref >59)
Glucose: 84 mg/dL (ref 65–99)
Potassium: 4.4 mmol/L (ref 3.5–5.2)
Sodium: 143 mmol/L (ref 134–144)

## 2020-01-02 LAB — TSH: TSH: 0.436 u[IU]/mL — ABNORMAL LOW (ref 0.450–4.500)

## 2020-01-02 LAB — HEPATITIS C ANTIBODY: Hep C Virus Ab: <0.1 s/co ratio (ref 0.0–0.9)

## 2020-01-02 LAB — HIV ANTIBODY (ROUTINE TESTING W REFLEX): HIV Screen 4th Generation wRfx: NONREACTIVE

## 2020-01-06 ENCOUNTER — Other Ambulatory Visit: Payer: Self-pay | Admitting: Nurse Practitioner

## 2020-01-06 ENCOUNTER — Encounter: Payer: Self-pay | Admitting: Nurse Practitioner

## 2020-01-06 DIAGNOSIS — E038 Other specified hypothyroidism: Secondary | ICD-10-CM

## 2020-01-06 MED ORDER — LEVOTHYROXINE SODIUM 175 MCG PO TABS: 175.0000 ug | ORAL_TABLET | Freq: Every day | ORAL | 2 refills | Status: DC

## 2020-01-06 MED ORDER — PHENTERMINE HCL 37.5 MG PO TABS: 37.5000 mg | ORAL_TABLET | Freq: Every day | ORAL | 1 refills | Status: DC

## 2020-01-06 MED FILL — LEVOTHYROXINE 175 MCG TABLE: 175 | 30 days supply | Qty: 30 | Fill #0

## 2020-01-07 MED FILL — PHENTERMINE 37.5 MG TABLET: 37.5 | 30 days supply | Qty: 30 | Fill #0

## 2020-02-14 MED FILL — MetFORMIN HCL 850 MG TAB: 850 | 90 days supply | Qty: 90 | Fill #0

## 2020-02-25 MED FILL — LEVOTHYROXINE 175 MCG TABLE: 175 | 30 days supply | Qty: 30 | Fill #1

## 2020-03-26 MED FILL — LEVOTHYROXINE 175 MCG TABLE: 175 | 30 days supply | Qty: 30 | Fill #2

## 2020-03-27 MED FILL — OMEPRAZOLE DR 20 MG CAPSULE: 20 | 90 days supply | Qty: 90 | Fill #1

## 2020-04-02 ENCOUNTER — Other Ambulatory Visit: Payer: Self-pay

## 2020-04-02 ENCOUNTER — Ambulatory Visit: Payer: No Typology Code available for payment source | Attending: Physician Assistant | Admitting: Physician Assistant

## 2020-04-02 ENCOUNTER — Encounter: Payer: Self-pay | Admitting: Physician Assistant

## 2020-04-02 ENCOUNTER — Other Ambulatory Visit: Payer: Self-pay | Admitting: Physician Assistant

## 2020-04-02 VITALS — BP 109/67 | HR 62 | Temp 98.5°F | Ht 73.0 in | Wt 376.0 lb

## 2020-04-02 DIAGNOSIS — E785 Hyperlipidemia, unspecified: Secondary | ICD-10-CM

## 2020-04-02 DIAGNOSIS — E1165 Type 2 diabetes mellitus with hyperglycemia: Secondary | ICD-10-CM

## 2020-04-02 DIAGNOSIS — Z87898 Personal history of other specified conditions: Secondary | ICD-10-CM

## 2020-04-02 DIAGNOSIS — E038 Other specified hypothyroidism: Secondary | ICD-10-CM

## 2020-04-02 DIAGNOSIS — L0591 Pilonidal cyst without abscess: Secondary | ICD-10-CM

## 2020-04-02 DIAGNOSIS — L732 Hidradenitis suppurativa: Secondary | ICD-10-CM

## 2020-04-02 DIAGNOSIS — Z6841 Body Mass Index (BMI) 40.0 and over, adult: Secondary | ICD-10-CM

## 2020-04-02 LAB — POCT GLYCOSYLATED HEMOGLOBIN (HGB A1C): Hemoglobin A1C: 5.9 % — AB (ref 4.0–5.6)

## 2020-04-02 LAB — GLUCOSE, POCT (MANUAL RESULT ENTRY): POC Glucose: 126 mg/dl — AB (ref 70–99)

## 2020-04-02 MED ORDER — SIMVASTATIN 10 MG PO TABS
10.0000 mg | ORAL_TABLET | Freq: Every day | ORAL | 3 refills | Status: DC
Start: 2020-04-02 — End: 2020-04-02

## 2020-04-02 MED ORDER — LEVOTHYROXINE SODIUM 175 MCG PO TABS: 175.0000 ug | ORAL_TABLET | Freq: Every day | ORAL | 2 refills | Status: DC

## 2020-04-02 MED ORDER — ATENOLOL 50 MG PO TABS: 50.0000 mg | ORAL_TABLET | Freq: Every day | ORAL | 1 refills | Status: DC

## 2020-04-02 MED ORDER — MUPIROCIN 2 % EX OINT: 1.0000 "application " | TOPICAL_OINTMENT | Freq: Two times a day (BID) | CUTANEOUS | 2 refills | Status: DC

## 2020-04-02 MED ORDER — METFORMIN HCL 850 MG PO TABS: 850.0000 mg | ORAL_TABLET | Freq: Every day | ORAL | 1 refills | Status: DC

## 2020-04-02 MED ORDER — GABAPENTIN 300 MG PO CAPS: 300.0000 mg | ORAL_CAPSULE | Freq: Three times a day (TID) | ORAL | 1 refills | Status: DC

## 2020-04-02 MED FILL — MUPIROCIN 2% OINTMENT: 2 | 10 days supply | Qty: 22 | Fill #0

## 2020-04-02 MED FILL — SIMVASTATIN 10 MG TABLET: 10 | 90 days supply | Qty: 90 | Fill #0

## 2020-04-02 MED FILL — ATENOLOL 50 MG TABLET: 50 | 90 days supply | Qty: 90 | Fill #0

## 2020-04-02 MED FILL — GABAPENTIN 300 MG CAPSULE: 300 | 90 days supply | Qty: 270 | Fill #0

## 2020-04-02 NOTE — Progress Notes (Signed)
Austin Beasley, is a 40 y.o. male  JOI:[id number]  OBS:[id number]  DOB - Jul 11, 1980  Subjective:  Chief Complaint and HPI: Austin Beasley is a 40 y.o. male here today for diabetes check.  Also needs meds for recurrent boils and has had a recurrence of pilonidal inflammation and wants to do a referral for that as his PCP said if it flared up again, he may need to have it removed.  He is also interested in bariatric surgery referral.  He has been working on diet changes and exercise.  ROS:   Constitutional:  No f/c, No night sweats, No unexplained weight loss. EENT:  No vision changes, No blurry vision, No hearing changes. No mouth, throat, or ear problems.  Respiratory: No cough, No SOB Cardiac: No CP, no palpitations GI:  No abd pain, No N/V/D. GU: No Urinary s/sx Musculoskeletal: back pain occasionally Neuro: No headache, no dizziness, no motor weakness.  Skin: No rash Endocrine:  No polydipsia. No polyuria.  Psych: Denies SI/HI  No problems updated.  ALLERGIES: No Known Allergies  PAST MEDICAL HISTORY: Past Medical History:  Diagnosis Date  . Diabetes mellitus without complication ([place])   . Obesity   . Palpitations   . Thyroid disease     MEDICATIONS AT HOME: Prior to Admission medications   Medication Sig Start Date End Date Taking? Authorizing Provider  Blood Glucose Monitoring Suppl (FREESTYLE LITE) DEVI Use as instructed. Check blood glucose level by fingerstick twice per day.  E11.65 08/20/19  Yes Charlott Rakes, MD  cyclobenzaprine (FLEXERIL) 10 MG tablet Take 1 tablet (10 mg total) by mouth 3 (three) times daily as needed for muscle spasms. 06/16/19  Yes Gildardo Pounds, NP  glucose blood (FREESTYLE LITE) test strip Use as instructed. Check blood glucose level by fingerstick twice per day.  E11.65 01/01/20  Yes Gildardo Pounds, NP  ibuprofen (ADVIL) 800 MG tablet Take 1 tablet (800 mg total) by mouth 3 (three) times daily. 12/11/19  Yes Wieters, Hallie C, PA-C   Lancets (FREESTYLE) lancets Use as instructed. Check blood glucose level by fingerstick twice per day.  E11.65 01/01/20  Yes Gildardo Pounds, NP  mupirocin ointment (BACTROBAN) 2 % Apply 1 application topically 2 (two) times daily. For 1 week prn boils 04/02/20  Yes McClung, Angela M, PA-C  senna-docusate (SENOKOT-S) 8.6-50 MG tablet Take 2 tablets by mouth 2 (two) times daily as needed for mild constipation or moderate constipation. 01/01/20  Yes Gildardo Pounds, NP  atenolol (TENORMIN) 50 MG tablet Take 1 tablet (50 mg total) by mouth daily. 04/02/20 07/01/20  Argentina Donovan, PA-C  gabapentin (NEURONTIN) 300 MG capsule Take 1 capsule (300 mg total) by mouth 3 (three) times daily. 04/02/20 07/01/20  Argentina Donovan, PA-C  HYDROcodone-acetaminophen (NORCO/VICODIN) 5-325 MG tablet Take 1-2 tablets by mouth every 6 (six) hours as needed. Patient not taking: Reported on 04/02/2020 12/11/19   Janith Lima, PA-C  levothyroxine (SYNTHROID) 175 MCG tablet Take 1 tablet (175 mcg total) by mouth daily before breakfast. 04/02/20 05/02/20  Argentina Donovan, PA-C  metFORMIN (GLUCOPHAGE) 850 MG tablet Take 1 tablet (850 mg total) by mouth daily with breakfast. 04/02/20 07/01/20  Argentina Donovan, PA-C  Nicotine 21-14-7 MG/24HR KIT 49m patch for 4 weeks; 14 mg patch for 2 weeks; 7 mg patch for 2 weeks. Place one patch on skin for 24 hours. Patient not taking: No sig reported 11/27/19   FGildardo Pounds NP  omeprazole (PRILOSEC) 20 MG  capsule Take 1 capsule (20 mg total) by mouth daily. 01/01/20 03/31/20  Gildardo Pounds, NP  phentermine (ADIPEX-P) 37.5 MG tablet Take 1 tablet (37.5 mg total) by mouth daily before breakfast. 01/06/20 02/05/20  Gildardo Pounds, NP  simvastatin (ZOCOR) 10 MG tablet Take 1 tablet (10 mg total) by mouth at bedtime. 04/02/20   Argentina Donovan, PA-C     Objective:  EXAM:   Vitals:   04/02/20 1412  BP: 109/67  Pulse: 62  Temp: 98.5 F (36.9 C)  TempSrc: Oral  SpO2: 98%   Weight: (!) 376 lb (170.6 kg)  Height: '6\' 1"'  (1.854 m)    General appearance : A&OX3. NAD. Non-toxic-appearing HEENT: Atraumatic and Normocephalic.  PERRLA. EOM intact.  Chest/Lungs:  Breathing-non-labored, Good air entry bilaterally, breath sounds normal without rales, rhonchi, or wheezing  CVS: S1 S2 regular, no murmurs, gallops, rubs  Extremities: Bilateral Lower Ext shows no edema, both legs are warm to touch with = pulse throughout Neurology:  CN II-XII grossly intact, Non focal.   Psych:  TP linear. J/I WNL. Normal speech. Appropriate eye contact and affect.  Skin:  No Rash  Data Review Lab Results  Component Value Date   HGBA1C 5.9 (A) 04/02/2020   HGBA1C 6.2 08/16/2019   HGBA1C 6.2 02/14/2019     Assessment & Plan   1. Type 2 diabetes mellitus with hyperglycemia, without long-term current use of insulin (HCC) A1C today-continue current regimen - HgB A1c - Glucose (CBG) - Amb Referral to Bariatric Surgery - gabapentin (NEURONTIN) 300 MG capsule; Take 1 capsule (300 mg total) by mouth 3 (three) times daily.  Dispense: 270 capsule; Refill: 1 - metFORMIN (GLUCOPHAGE) 850 MG tablet; Take 1 tablet (850 mg total) by mouth daily with breakfast.  Dispense: 90 tablet; Refill: 1 - Microalbumin/Creatinine Ratio, Urine  2. Class 3 severe obesity with serious comorbidity and body mass index (BMI) of 50.0 to 59.9 in adult, unspecified obesity type ([place]) Proper diet and exercise - Amb Referral to Bariatric Surgery  3. Hidradenitis - Ambulatory referral to General Surgery - mupirocin ointment (BACTROBAN) 2 %; Apply 1 application topically 2 (two) times daily. For 1 week prn boils  Dispense: 22 g; Refill: 2  4. History of palpitations - atenolol (TENORMIN) 50 MG tablet; Take 1 tablet (50 mg total) by mouth daily.  Dispense: 90 tablet; Refill: 1  5. Other specified hypothyroidism - Thyroid Panel With TSH - levothyroxine (SYNTHROID) 175 MCG tablet; Take 1 tablet (175 mcg total) by  mouth daily before breakfast.  Dispense: 30 tablet; Refill: 2  6. Dyslipidemia, goal LDL below 70 - Amb Referral to Bariatric Surgery - simvastatin (ZOCOR) 10 MG tablet; Take 1 tablet (10 mg total) by mouth at bedtime.  Dispense: 90 tablet; Refill: 3  7. Pilonidal cyst Active inflammation is resolved - Ambulatory referral to General Surgery   Patient have been counseled extensively about nutrition and exercise  Return in about 3 months (around 07/01/2020) for PCP for CPE.  The patient was given clear instructions to go to ER or return to medical center if symptoms don't improve, worsen or new problems develop. The patient verbalized understanding. The patient was told to call to get lab results if they haven't heard anything in the next week.     Austin Caldron, PA-C [facility] and [place], [place]   04/02/2020, 2:44 PMPatient ID: JAEVIAN SHEAN, male   DOB: [birth year], 40 y.o.   MRN: [id number]

## 2020-04-03 LAB — MICROALBUMIN / CREATININE URINE RATIO
Creatinine, Urine: 186.3 mg/dL
Microalb/Creat Ratio: 2 mg/g creat (ref 0–29)
Microalbumin, Urine: 3.9 ug/mL

## 2020-04-03 LAB — THYROID PANEL WITH TSH
Free Thyroxine Index: 2.8 (ref 1.2–4.9)
T3 Uptake Ratio: 29 % (ref 24–39)
T4, Total: 9.5 ug/dL (ref 4.5–12.0)
TSH: 0.684 u[IU]/mL (ref 0.450–4.500)

## 2020-04-08 ENCOUNTER — Other Ambulatory Visit: Payer: Self-pay | Admitting: Nurse Practitioner

## 2020-04-15 MED FILL — ATENOLOL 50 MG TABLET: 50 | 90 days supply | Qty: 90 | Fill #0

## 2020-04-15 MED FILL — MUPIROCIN 2% OINTMENT: 2 | 10 days supply | Qty: 22 | Fill #0

## 2020-04-28 MED FILL — LEVOTHYROXINE 175 MCG TABLE: 175 | 30 days supply | Qty: 30 | Fill #0

## 2020-05-01 ENCOUNTER — Telehealth: Payer: Self-pay

## 2020-05-01 NOTE — Telephone Encounter (Signed)
CMA spoke to patient to inform CMA faxed the FMLA form.  Patient is informed.

## 2020-05-25 MED FILL — LEVOTHYROXINE 175 MCG TABLE: 175 | 30 days supply | Qty: 30 | Fill #1

## 2020-05-25 MED FILL — FREESTYLE LITE TEST STRIP: 50 days supply | Qty: 100 | Fill #1

## 2020-05-25 MED FILL — SIMVASTATIN 10 MG TABLET: 10 | 90 days supply | Qty: 90 | Fill #1

## 2020-06-06 ENCOUNTER — Other Ambulatory Visit (HOSPITAL_COMMUNITY): Payer: Self-pay

## 2020-06-24 ENCOUNTER — Other Ambulatory Visit (HOSPITAL_COMMUNITY): Payer: Self-pay

## 2020-06-24 MED FILL — Levothyroxine Sodium Tab 175 MCG: ORAL | 30 days supply | Qty: 30 | Fill #0 | Status: AC

## 2020-07-20 ENCOUNTER — Other Ambulatory Visit (HOSPITAL_COMMUNITY): Payer: Self-pay

## 2020-07-20 ENCOUNTER — Other Ambulatory Visit: Payer: Self-pay | Admitting: Nurse Practitioner

## 2020-07-20 ENCOUNTER — Other Ambulatory Visit: Payer: Self-pay | Admitting: Physician Assistant

## 2020-07-20 DIAGNOSIS — K2100 Gastro-esophageal reflux disease with esophagitis, without bleeding: Secondary | ICD-10-CM

## 2020-07-20 DIAGNOSIS — E038 Other specified hypothyroidism: Secondary | ICD-10-CM

## 2020-07-20 MED ORDER — OMEPRAZOLE 20 MG PO CPDR
1.0000 | DELAYED_RELEASE_CAPSULE | Freq: Every day | ORAL | 0 refills | Status: DC
  Filled 2020-07-20: qty 90, 90d supply, fill #0

## 2020-07-20 MED ORDER — LEVOTHYROXINE SODIUM 175 MCG PO TABS
175.0000 ug | ORAL_TABLET | ORAL | 2 refills | Status: DC
  Filled 2020-07-20: qty 90, 90d supply, fill #0

## 2020-07-20 MED FILL — Atenolol Tab 50 MG: ORAL | 90 days supply | Qty: 90 | Fill #0 | Status: AC

## 2020-07-20 NOTE — Telephone Encounter (Signed)
Requested Prescriptions  Pending Prescriptions Disp Refills  . omeprazole (PRILOSEC) 20 MG capsule 90 capsule 0    Sig: TAKE 1 CAPSULE (20 MG TOTAL) BY MOUTH DAILY.     Gastroenterology: Proton Pump Inhibitors Passed - 07/20/2020  4:47 PM      Passed - Valid encounter within last 12 months    Recent Outpatient Visits          3 months ago Type 2 diabetes mellitus with hyperglycemia, without long-term current use of insulin [facility])   [place] [facility] [place], [place], [place]   6 months ago Type 2 diabetes mellitus with hyperglycemia, without long-term current use of insulin [facility])   [place] [facility] [place], [place] W, NP   11 months ago Type 2 diabetes mellitus with hyperglycemia, without long-term current use of insulin [facility])   [place] [facility] [place], Shea Stakes, NP   1 year ago Blood pressure check   [facility] [place] Chalet, RPH-CPP   1 year ago Type 2 diabetes mellitus with hyperglycemia, without long-term current use of insulin [facility])    [facility] Claiborne Rigg, NP      Future Appointments            In 2 months Claiborne Rigg, NP [facility]

## 2020-07-20 NOTE — Telephone Encounter (Signed)
Requested Prescriptions  Pending Prescriptions Disp Refills  . levothyroxine (SYNTHROID) 175 MCG tablet 90 tablet 2    Sig: TAKE 1 TABLET (175 MCG TOTAL) BY MOUTH DAILY BEFORE BREAKFAST.     Endocrinology:  Hypothyroid Agents Failed - 07/20/2020  4:47 PM      Failed - TSH needs to be rechecked within 3 months after an abnormal result. Refill until TSH is due.      Passed - TSH in normal range and within 360 days    TSH  Date Value Ref Range Status  04/02/2020 0.684 0.450 - 4.500 uIU/mL Final         Passed - Valid encounter within last 12 months    Recent Outpatient Visits          3 months ago Type 2 diabetes mellitus with hyperglycemia, without long-term current use of insulin [facility])   [place] [facility] [place], [place], [place]   6 months ago Type 2 diabetes mellitus with hyperglycemia, without long-term current use of insulin [facility])   [place] [facility] [place], [place] W, NP   11 months ago Type 2 diabetes mellitus with hyperglycemia, without long-term current use of insulin [facility])   [place] [facility] [place], Shea Stakes, NP   1 year ago Blood pressure check   [facility] [place], Cornelius Moras, RPH-CPP   1 year ago Type 2 diabetes mellitus with hyperglycemia, without long-term current use of insulin [facility])   [place] [place], Shea Stakes, NP      Future Appointments            In 2 months Claiborne Rigg, NP [facility] [facility]

## 2020-07-21 ENCOUNTER — Other Ambulatory Visit (HOSPITAL_COMMUNITY): Payer: Self-pay

## 2020-08-03 ENCOUNTER — Emergency Department (HOSPITAL_COMMUNITY): Payer: PRIVATE HEALTH INSURANCE

## 2020-08-03 ENCOUNTER — Emergency Department (HOSPITAL_COMMUNITY)
Admission: EM | Admit: 2020-08-03 | Discharge: 2020-08-03 | Disposition: A | Discharge status: Home / Self Care | Payer: PRIVATE HEALTH INSURANCE | Attending: Emergency Medicine | Admitting: Emergency Medicine

## 2020-08-03 ENCOUNTER — Encounter (HOSPITAL_COMMUNITY): Payer: Self-pay | Admitting: Emergency Medicine

## 2020-08-03 ENCOUNTER — Other Ambulatory Visit: Payer: Self-pay

## 2020-08-03 DIAGNOSIS — M5412 Radiculopathy, cervical region: Secondary | ICD-10-CM | POA: Diagnosis present

## 2020-08-03 DIAGNOSIS — Z79899 Other long term (current) drug therapy: Secondary | ICD-10-CM | POA: Diagnosis not present

## 2020-08-03 DIAGNOSIS — F17210 Nicotine dependence, cigarettes, uncomplicated: Secondary | ICD-10-CM | POA: Insufficient documentation

## 2020-08-03 DIAGNOSIS — Z7984 Long term (current) use of oral hypoglycemic drugs: Secondary | ICD-10-CM | POA: Diagnosis not present

## 2020-08-03 DIAGNOSIS — M62838 Other muscle spasm: Secondary | ICD-10-CM | POA: Diagnosis not present

## 2020-08-03 DIAGNOSIS — E119 Type 2 diabetes mellitus without complications: Secondary | ICD-10-CM | POA: Diagnosis not present

## 2020-08-03 DIAGNOSIS — I10 Essential (primary) hypertension: Secondary | ICD-10-CM | POA: Insufficient documentation

## 2020-08-03 HISTORY — DX: Pure hypercholesterolemia, unspecified: E78.00

## 2020-08-03 MED ORDER — KETOROLAC TROMETHAMINE 30 MG/ML IJ SOLN
30.0000 mg | Freq: Once | INTRAMUSCULAR | Status: AC
  Administered 2020-08-03: 30 mg via INTRAMUSCULAR
  Filled 2020-08-03: qty 1

## 2020-08-03 MED ORDER — METHOCARBAMOL 500 MG PO TABS
500.0000 mg | ORAL_TABLET | Freq: Two times a day (BID) | ORAL | 0 refills | Status: DC
  Filled 2020-08-03: qty 20, 10d supply, fill #0

## 2020-08-03 NOTE — ED Provider Notes (Signed)
[facility] EMERGENCY DEPARTMENT Provider Note   CSN: [id number] Arrival date & time: 08/03/20  1402     History No chief complaint on file.   Austin Beasley is a 40 y.o. male.  HPI   Pt is a 40 y/o male with a h/o DM, HTN, HLD, obesity, palpitations, thyroid disease, who presents to the ED today for eval of left shoulder pain. States he was pushing a cart yesterday at work when he felt a pop. He has had pain since onset that has been worsening. Pain is constant and is worse with movement. He does not report any numbness/weakness  Past Medical History:  Diagnosis Date  . Diabetes mellitus without complication ([place])   . High cholesterol   . Hypertension   . Obesity   . Palpitations   . Thyroid disease     Patient Active Problem List   Diagnosis Date Noted  . Class 3 severe obesity with serious comorbidity and body mass index (BMI) of 50.0 to 59.9 in adult ([place]) 05/16/2019  . Type 2 diabetes mellitus with hyperglycemia, without long-term current use of insulin ([place]) 08/07/2018  . Diabetes mellitus without complication ([place]) 15/40/0867    Past Surgical History:  Procedure Laterality Date  . ELEVATION OF DEPRESSED SKULL FRACTURE     age 80       Family History  Problem Relation Age of Onset  . Diabetes Mother   . Cancer Mother   . Hypertension Father   . Cancer Sister   . Birth defects Maternal Grandmother   . Birth defects Maternal Grandfather   . Birth defects Paternal Grandmother   . Birth defects Paternal Grandfather     Social History   Tobacco Use  . Smoking status: Current Some Day Smoker    Types: Cigarettes  . Smokeless tobacco: Never Used  Vaping Use  . Vaping Use: Never used  Substance Use Topics  . Alcohol use: Yes    Comment: occ  . Drug use: Yes    Types: Marijuana    Comment: occ    Home Medications Prior to Admission medications   Medication Sig Start Date End Date Taking? Authorizing Provider  methocarbamol  (ROBAXIN) 500 MG tablet Take 1 tablet (500 mg total) by mouth 2 (two) times daily. 08/03/20  Yes Addalyne Vandehei S, PA-C  atenolol (TENORMIN) 50 MG tablet TAKE 1 TABLET (50 MG TOTAL) BY MOUTH DAILY. 04/02/20 04/02/21  Argentina Donovan, PA-C  Blood Glucose Monitoring Suppl (FREESTYLE LITE) DEVI Use as instructed. Check blood glucose level by fingerstick twice per day.  E11.65 08/20/19   Charlott Rakes, MD  clindamycin (CLEOCIN) 300 MG capsule TAKE 1 CAPSULE (300 MG TOTAL) BY MOUTH 3 (THREE) TIMES DAILY FOR 7 DAYS. 12/11/19 12/10/20  Wieters, Hallie C, PA-C  cyclobenzaprine (FLEXERIL) 10 MG tablet Take 1 tablet (10 mg total) by mouth 3 (three) times daily as needed for muscle spasms. 06/16/19   Gildardo Pounds, NP  gabapentin (NEURONTIN) 300 MG capsule TAKE 1 CAPSULE BY MOUTH THREE TIMES DAILY. 04/02/20 04/02/21  Argentina Donovan, PA-C  glucose blood test strip CHECK BLOOD GLUCOSE TWICE PER DAY AS INSTRUCTED 01/01/20 12/31/20  Gildardo Pounds, NP  ibuprofen (ADVIL) 800 MG tablet TAKE 1 TABLET (800 MG TOTAL) BY MOUTH 3 (THREE) TIMES DAILY. 12/11/19 12/10/20  Wieters, Hallie C, PA-C  Lancets (FREESTYLE) lancets CHECK BLOOD GLUCOSE TWICE PER DAY AS INSTRUCTED 01/01/20 12/31/20  Gildardo Pounds, NP  levothyroxine (SYNTHROID) 175 MCG tablet  Take 1 tablet (175 mcg total) by mouth daily before breakfast 07/20/20 07/20/21  Gildardo Pounds, NP  metFORMIN (GLUCOPHAGE) 850 MG tablet TAKE 1 TABLET (850 MG TOTAL) BY MOUTH DAILY WITH BREAKFAST. 04/02/20 04/02/21  Argentina Donovan, PA-C  mupirocin ointment (BACTROBAN) 2 % APPLY TOPICALLY TO AFFECTED AREA TWICE DAILY FOR 1 WEEK AS NEEDED FOR BOILS. 04/02/20 04/02/21  Argentina Donovan, PA-C  Nicotine 21-14-7 MG/24HR KIT 27m patch for 4 weeks; 14 mg patch for 2 weeks; 7 mg patch for 2 weeks. Place one patch on skin for 24 hours. Patient not taking: No sig reported 11/27/19   FGildardo Pounds NP  omeprazole (PRILOSEC) 20 MG capsule Take 1 capsule (20 mg total) by mouth daily.  07/20/20   FGildardo Pounds NP  phentermine (ADIPEX-P) 37.5 MG tablet TAKE 1 TABLET (37.5 MG TOTAL) BY MOUTH DAILY BEFORE BREAKFAST. 01/06/20 07/04/20  FGildardo Pounds NP  senna-docusate (SENOKOT-S) 8.6-50 MG tablet Take 2 tablets by mouth 2 (two) times daily as needed for mild constipation or moderate constipation. 01/01/20   FGildardo Pounds NP  simvastatin (ZOCOR) 10 MG tablet TAKE 1 TABLET (10 MG TOTAL) BY MOUTH AT BEDTIME. 04/02/20 04/02/21  MArgentina Donovan PA-C    Allergies    Patient has no known allergies.  Review of Systems   Review of Systems  Constitutional: Negative for fever.  Musculoskeletal:       Left shoulder pain  Neurological: Negative for weakness and numbness.    Physical Exam Updated Vital Signs BP (!) 151/82 (BP Location: Right Arm)   Pulse 74   Temp 98.3 F (36.8 C) (Oral)   Resp 18   SpO2 98%   Physical Exam Constitutional:      General: He is not in acute distress.    Appearance: He is well-developed.  Eyes:     Conjunctiva/sclera: Conjunctivae normal.  Cardiovascular:     Rate and Rhythm: Normal rate and regular rhythm.  Pulmonary:     Effort: Pulmonary effort is normal.     Breath sounds: Normal breath sounds.  Musculoskeletal:     Comments: TTP to the left trapezius muscle with muscle spasm. This reproduces pain. Radial pulse intact. ROM is somewhat limited by pain.   Skin:    General: Skin is warm and dry.  Neurological:     Mental Status: He is alert and oriented to person, place, and time.     ED Results / Procedures / Treatments   Labs (all labs ordered are listed, but only abnormal results are displayed) Labs Reviewed - No data to display  EKG None  Radiology DG Shoulder Left  Result Date: 08/03/2020 CLINICAL DATA:  Left shoulder pain EXAM: LEFT SHOULDER - 2+ VIEW COMPARISON:  None. FINDINGS: There is no evidence of fracture or dislocation. There is no evidence of arthropathy or other focal bone abnormality. Soft tissues are  unremarkable. IMPRESSION: Negative. Electronically Signed   By: HKathreen Devoid  On: 08/03/2020 15:11    Procedures Procedures   Medications Ordered in ED Medications - No data to display  ED Course  I have reviewed the triage vital signs and the nursing notes.  Pertinent labs & imaging results that were available during my care of the patient were reviewed by me and considered in my medical decision making (see chart for details).    MDM Rules/Calculators/A&P  40 y/o M presenting to the ED today for eval of left shoulder pain that started after pushing a cart yesterday. He has reproducible ttp to the left trapezius muscle. Denies cp, sob, or numbness/weakness. Xray left shoulder reviewed/interpreted and neg for fracture or dislocation. I suspect muscle spasm vs strain. rx for antiinflammatories and muscle relaxers given. Advised on pcp f/u and strict return precautions. He voices understanding and is in agreement with the plan. All questions answered, pt stable for discharge   Final Clinical Impression(s) / ED Diagnoses Final diagnoses:  Trapezius muscle spasm    Rx / DC Orders ED Discharge Orders         Ordered    methocarbamol (ROBAXIN) 500 MG tablet  2 times daily        08/03/20 [address] Amiyrah Lamere S, PA-C 08/03/20 1521    Gareth Morgan, MD 08/04/20 1439

## 2020-08-03 NOTE — ED Triage Notes (Signed)
C/o L shoulder pain since yesterday.  States he heard a pop when moving a metal cart into a freezer at work.  C/o burning pain.

## 2020-08-03 NOTE — Discharge Instructions (Signed)
You may alternate taking Tylenol and Ibuprofen as needed for pain control. You may take 400-600 mg of ibuprofen every 6 hours and [phone] mg of Tylenol every 6 hours. Do not exceed 4000 mg of Tylenol daily as this can lead to liver damage. Also, make sure to take Ibuprofen with meals as it can cause an upset stomach. Do not take other NSAIDs while taking Ibuprofen such as (Aleve, Naprosyn, Aspirin, Celebrex, etc) and do not take more than the prescribed dose as this can lead to ulcers and bleeding in your GI tract. You may use warm and cold compresses to help with your symptoms.   Please follow up with your primary doctor within the next 7-10 days for re-evaluation and further treatment of your symptoms.   You were given a prescription for Robaxin which is a muscle relaxer.  You should not drive, work, or operate machinery while taking this medication as it can make you very drowsy.    Please return to the ER sooner if you have any new or worsening symptoms.

## 2020-08-04 ENCOUNTER — Other Ambulatory Visit (HOSPITAL_COMMUNITY): Payer: Self-pay

## 2020-08-07 ENCOUNTER — Other Ambulatory Visit (HOSPITAL_COMMUNITY): Payer: Self-pay

## 2020-08-07 MED FILL — Metformin HCl Tab 850 MG: ORAL | 90 days supply | Qty: 90 | Fill #0 | Status: AC

## 2020-08-13 ENCOUNTER — Other Ambulatory Visit (HOSPITAL_COMMUNITY): Payer: Self-pay

## 2020-09-08 ENCOUNTER — Other Ambulatory Visit (HOSPITAL_COMMUNITY): Payer: Self-pay

## 2020-09-08 MED FILL — Simvastatin Tab 10 MG: ORAL | 90 days supply | Qty: 90 | Fill #0 | Status: AC

## 2020-09-18 ENCOUNTER — Other Ambulatory Visit (HOSPITAL_COMMUNITY): Payer: Self-pay

## 2020-09-18 ENCOUNTER — Encounter: Payer: Self-pay | Admitting: Nurse Practitioner

## 2020-09-18 ENCOUNTER — Other Ambulatory Visit: Payer: Self-pay

## 2020-09-18 ENCOUNTER — Ambulatory Visit: Payer: No Typology Code available for payment source | Attending: Nurse Practitioner | Admitting: Nurse Practitioner

## 2020-09-18 VITALS — BP 123/72 | HR 56 | Ht 74.0 in | Wt 378.0 lb

## 2020-09-18 DIAGNOSIS — G8929 Other chronic pain: Secondary | ICD-10-CM

## 2020-09-18 DIAGNOSIS — E1165 Type 2 diabetes mellitus with hyperglycemia: Secondary | ICD-10-CM

## 2020-09-18 DIAGNOSIS — E785 Hyperlipidemia, unspecified: Secondary | ICD-10-CM | POA: Diagnosis not present

## 2020-09-18 DIAGNOSIS — E038 Other specified hypothyroidism: Secondary | ICD-10-CM

## 2020-09-18 DIAGNOSIS — L732 Hidradenitis suppurativa: Secondary | ICD-10-CM

## 2020-09-18 DIAGNOSIS — F17200 Nicotine dependence, unspecified, uncomplicated: Secondary | ICD-10-CM

## 2020-09-18 DIAGNOSIS — R002 Palpitations: Secondary | ICD-10-CM | POA: Diagnosis not present

## 2020-09-18 DIAGNOSIS — M545 Low back pain, unspecified: Secondary | ICD-10-CM

## 2020-09-18 DIAGNOSIS — K2100 Gastro-esophageal reflux disease with esophagitis, without bleeding: Secondary | ICD-10-CM

## 2020-09-18 MED ORDER — GLUCOSE BLOOD VI STRP
ORAL_STRIP | 2 refills | Status: DC
  Filled 2020-09-18: qty 100, 50d supply, fill #0
  Filled 2020-11-10: qty 100, 50d supply, fill #1

## 2020-09-18 MED ORDER — SIMVASTATIN 10 MG PO TABS
10.0000 mg | ORAL_TABLET | Freq: Every day | ORAL | 3 refills | Status: DC
Start: 2020-09-18 — End: 2020-12-21
  Filled 2020-09-18: qty 90, 90d supply, fill #0

## 2020-09-18 MED ORDER — METFORMIN HCL 850 MG PO TABS
850.0000 mg | ORAL_TABLET | Freq: Every day | ORAL | 1 refills | Status: DC
  Filled 2020-09-18: qty 90, fill #0
  Filled 2020-11-10: qty 30, 30d supply, fill #0
  Filled 2020-12-15: qty 30, 30d supply, fill #1

## 2020-09-18 MED ORDER — MUPIROCIN 2 % EX OINT
TOPICAL_OINTMENT | CUTANEOUS | 2 refills | Status: AC
Start: 2020-09-18 — End: 2021-09-18
  Filled 2020-09-18: qty 22, 7d supply, fill #0

## 2020-09-18 MED ORDER — METHOCARBAMOL 500 MG PO TABS
500.0000 mg | ORAL_TABLET | Freq: Two times a day (BID) | ORAL | 0 refills | Status: DC
  Filled 2020-09-18: qty 20, 10d supply, fill #0

## 2020-09-18 MED ORDER — OMEPRAZOLE 20 MG PO CPDR
20.0000 mg | DELAYED_RELEASE_CAPSULE | Freq: Every day | ORAL | 0 refills | Status: DC
Start: 2020-09-18 — End: 2020-12-21
  Filled 2020-09-18: qty 90, 90d supply, fill #0
  Filled 2020-10-21: qty 30, 30d supply, fill #0

## 2020-09-18 MED ORDER — ATENOLOL 50 MG PO TABS
50.0000 mg | ORAL_TABLET | Freq: Every day | ORAL | 1 refills | Status: DC
  Filled 2020-09-18: qty 90, fill #0
  Filled 2020-10-21: qty 90, 90d supply, fill #0

## 2020-09-18 MED ORDER — LEVOTHYROXINE SODIUM 175 MCG PO TABS
175.0000 ug | ORAL_TABLET | ORAL | 2 refills | Status: DC
  Filled 2020-09-18: qty 90, fill #0
  Filled 2020-10-21: qty 30, 30d supply, fill #0
  Filled 2020-11-25: qty 30, 30d supply, fill #1

## 2020-09-18 MED ORDER — GABAPENTIN 300 MG PO CAPS
300.0000 mg | ORAL_CAPSULE | Freq: Three times a day (TID) | ORAL | 1 refills | Status: DC
  Filled 2020-09-18: qty 270, 90d supply, fill #0

## 2020-09-18 MED ORDER — NICOTINE 21-14-7 MG/24HR TD KIT
PACK | TRANSDERMAL | 0 refills | Status: DC
Start: 2020-09-18 — End: 2021-03-24
  Filled 2020-09-18: qty 1, fill #0

## 2020-09-18 NOTE — Progress Notes (Signed)
Heart flutters.

## 2020-09-18 NOTE — Progress Notes (Signed)
Assessment & Plan:  Austin Beasley was seen today for diabetes.  Diagnoses and all orders for this visit:  Type 2 diabetes mellitus with hyperglycemia, without long-term current use of insulin (HCC) -     Hemoglobin A1c -     CMP14+EGFR -     gabapentin (NEURONTIN) 300 MG capsule; Take 1 capsule (300 mg total) by mouth 3 (three) times daily. -     metFORMIN (GLUCOPHAGE) 850 MG tablet; Take 1 tablet (850 mg total) by mouth daily with breakfast. -     glucose blood test strip; CHECK BLOOD GLUCOSE TWICE PER DAY AS INSTRUCTED  Intermittent palpitations -     Thyroid Panel With TSH -     CBC -     Ambulatory referral to Cardiology -     atenolol (TENORMIN) 50 MG tablet; Take 1 tablet (50 mg total) by mouth daily.  Other specified hypothyroidism -     levothyroxine (SYNTHROID) 175 MCG tablet; Take 1 tablet (175 mcg total) by mouth daily before breakfast  Gastroesophageal reflux disease with esophagitis without hemorrhage -     omeprazole (PRILOSEC) 20 MG capsule; Take 1 capsule (20 mg total) by mouth daily. INSTRUCTIONS: Avoid GERD Triggers: acidic, spicy or fried foods, caffeine, coffee, sodas,  alcohol and chocolate.    Dyslipidemia, goal LDL below 70 -     simvastatin (ZOCOR) 10 MG tablet; Take 1 tablet (10 mg total) by mouth at bedtime. -     Lipid panel INSTRUCTIONS: Work on a low fat, heart healthy diet and participate in regular aerobic exercise program by working out at least 150 minutes per week; 5 days a week-30 minutes per day. Avoid red meat/beef/steak,  fried foods. junk foods, sodas, sugary drinks, unhealthy snacking, alcohol and smoking.  Drink at least 80 oz of water per day and monitor your carbohydrate intake daily.    Hidradenitis Symptoms are well controlled-     mupirocin ointment (BACTROBAN) 2 %; APPLY TOPICALLY TO AFFECTED AREA TWICE DAILY FOR 1 WEEK AS NEEDED FOR BOILS.  Chronic bilateral low back pain without sciatica Back pain is well controlled-     methocarbamol  (ROBAXIN) 500 MG tablet; Take 1 tablet (500 mg total) by mouth 2 (two) times daily. Work on losing weight to help reduce back pain. May alternate with heat and ice application for pain relief. May also alternate with acetaminophen and Ibuprofen as prescribed for back pain. Other alternatives include massage, acupuncture and water aerobics.    Tobacco dependence -     Nicotine 21-14-7 MG/24HR KIT; 98m patch for 4 weeks; 14 mg patch for 2 weeks; 7 mg patch for 2 weeks. Place one patch on skin for 24 hours.   Patient has been counseled on age-appropriate routine health concerns for screening and prevention. These are reviewed and up-to-date. Referrals have been placed accordingly. Immunizations are up-to-date or declined.    Subjective:   Chief Complaint  Patient presents with   Diabetes   HPI Austin CARAVEO40y.[id number]. male presents to office today for follow up. He has a past medical history of Diabetes mellitus without complication ([facility], High cholesterol, Hypertension, Obesity, Palpitations, and Thyroid disease.   Palpitations: Patient complains of palpitations, rapid heart beat, and slow heart beat.  The symptoms are of moderate, severe, brief, and intermittent severity, occuring intermittently several days per week and lasting several seconds per episode. Cardiac risk factors include: diabetes mellitus, dyslipidemia, hypertension, male gender, obesity (BMI >= 30 kg/m2), and smoking/ tobacco exposure.  Aggravating factors: stress/anxiety, thyroid medication. Relieving factors: spontaneous. Associated signs and symptoms: none Currently taking atenolol 50 mg daily which has decreased the severity of symptoms but not resolved.    DM 2 Well-controlled at this time with metformin 850 mg daily.  Glucometer average readings as follows: 7 day 119 14 day 114 30 day 122 LDL not quite at goal however he endorses adherence taking Zocor 10 mg daily.  He does continue to smoke cigarettes which he is  trying to cut back on. Lab Results  Component Value Date   HGBA1C 5.9 (A) 04/02/2020    Lab Results  Component Value Date   LDLCALC 80 06/13/2019     Hypothyroidism Well controlled with Synthroid 175 mcg daily. Lab Results  Component Value Date   TSH 0.684 04/02/2020    Review of Systems  Constitutional:  Negative for fever, malaise/fatigue and weight loss.  HENT: Negative.  Negative for nosebleeds.   Eyes: Negative.  Negative for blurred vision, double vision and photophobia.  Respiratory: Negative.  Negative for cough, sputum production and shortness of breath.   Cardiovascular:  Positive for palpitations. Negative for chest pain, leg swelling and PND.  Gastrointestinal: Negative.  Negative for heartburn, nausea and vomiting.  Musculoskeletal: Negative.  Negative for myalgias.  Neurological: Negative.  Negative for dizziness, focal weakness, seizures and headaches.  Psychiatric/Behavioral: Negative.  Negative for suicidal ideas.    Past Medical History:  Diagnosis Date   Diabetes mellitus without complication (HCC)    High cholesterol    Hypertension    Obesity    Palpitations    Thyroid disease     Past Surgical History:  Procedure Laterality Date   ELEVATION OF DEPRESSED SKULL FRACTURE     age 2    Family History  Problem Relation Age of Onset   Diabetes Mother    Cancer Mother    Hypertension Father    Cancer Sister    Birth defects Maternal Grandmother    Birth defects Maternal Grandfather    Birth defects Paternal Grandmother    Birth defects Paternal Grandfather     Social History Reviewed with no changes to be made today.   Outpatient Medications Prior to Visit  Medication Sig Dispense Refill   Blood Glucose Monitoring Suppl (FREESTYLE LITE) DEVI Use as instructed. Check blood glucose level by fingerstick twice per day.  E11.65 1 each 0   ibuprofen (ADVIL) 800 MG tablet TAKE 1 TABLET (800 MG TOTAL) BY MOUTH 3 (THREE) TIMES DAILY. 21 tablet 0    Lancets (FREESTYLE) lancets CHECK BLOOD GLUCOSE TWICE PER DAY AS INSTRUCTED 100 each 2   senna-docusate (SENOKOT-S) 8.6-50 MG tablet Take 2 tablets by mouth 2 (two) times daily as needed for mild constipation or moderate constipation. 60 tablet 1   atenolol (TENORMIN) 50 MG tablet TAKE 1 TABLET (50 MG TOTAL) BY MOUTH DAILY. 90 tablet 1   cyclobenzaprine (FLEXERIL) 10 MG tablet Take 1 tablet (10 mg total) by mouth 3 (three) times daily as needed for muscle spasms. 30 tablet 0   gabapentin (NEURONTIN) 300 MG capsule TAKE 1 CAPSULE BY MOUTH THREE TIMES DAILY. 270 capsule 1   glucose blood test strip CHECK BLOOD GLUCOSE TWICE PER DAY AS INSTRUCTED 100 strip 2   levothyroxine (SYNTHROID) 175 MCG tablet Take 1 tablet (175 mcg total) by mouth daily before breakfast 90 tablet 2   metFORMIN (GLUCOPHAGE) 850 MG tablet TAKE 1 TABLET (850 MG TOTAL) BY MOUTH DAILY WITH BREAKFAST. 90 tablet 1  methocarbamol (ROBAXIN) 500 MG tablet Take 1 tablet (500 mg total) by mouth 2 (two) times daily. 20 tablet 0   mupirocin ointment (BACTROBAN) 2 % APPLY TOPICALLY TO AFFECTED AREA TWICE DAILY FOR 1 WEEK AS NEEDED FOR BOILS. 22 g 2   omeprazole (PRILOSEC) 20 MG capsule Take 1 capsule (20 mg total) by mouth daily. 90 capsule 0   simvastatin (ZOCOR) 10 MG tablet Take 1 tablet (10 mg total) by mouth at bedtime. 90 tablet 3   clindamycin (CLEOCIN) 300 MG capsule TAKE 1 CAPSULE (300 MG TOTAL) BY MOUTH 3 (THREE) TIMES DAILY FOR 7 DAYS. (Patient not taking: Reported on 09/18/2020) 21 capsule 0   Nicotine 21-14-7 MG/24HR KIT 60m patch for 4 weeks; 14 mg patch for 2 weeks; 7 mg patch for 2 weeks. Place one patch on skin for 24 hours. (Patient not taking: No sig reported) 1 kit 0   phentermine (ADIPEX-P) 37.5 MG tablet TAKE 1 TABLET (37.5 MG TOTAL) BY MOUTH DAILY BEFORE BREAKFAST. 30 tablet 1   No facility-administered medications prior to visit.    No Known Allergies     Objective:    BP 123/72   Pulse (!) 56   Ht 6' 2"  (1.88 m)   Wt (!) 378 lb (171.5 kg)   SpO2 96%   BMI 48.53 kg/m  Wt Readings from Last 3 Encounters:  09/18/20 (!) 378 lb (171.5 kg)  08/03/20 (!) 350 lb (158.8 kg)  04/02/20 (!) 376 lb (170.6 kg)    Physical Exam Vitals and nursing note reviewed.  Constitutional:      Appearance: He is well-developed.  HENT:     Head: Normocephalic and atraumatic.  Cardiovascular:     Rate and Rhythm: Regular rhythm. Bradycardia present.     Heart sounds: Normal heart sounds. No murmur heard.   No friction rub. No gallop.  Pulmonary:     Effort: Pulmonary effort is normal. No tachypnea or respiratory distress.     Breath sounds: Normal breath sounds. No decreased breath sounds, wheezing, rhonchi or rales.  Chest:     Chest wall: No tenderness.  Abdominal:     General: Bowel sounds are normal.     Palpations: Abdomen is soft.  Musculoskeletal:        General: Normal range of motion.     Cervical back: Normal range of motion.  Skin:    General: Skin is warm and dry.  Neurological:     Mental Status: He is alert and oriented to person, place, and time.     Coordination: Coordination normal.  Psychiatric:        Behavior: Behavior normal. Behavior is cooperative.        Thought Content: Thought content normal.        Judgment: Judgment normal.         Patient has been counseled extensively about nutrition and exercise as well as the importance of adherence with medications and regular follow-up. The patient was given clear instructions to go to ER or return to medical center if symptoms don't improve, worsen or new problems develop. The patient verbalized understanding.   Follow-up: Return in about 3 months (around 12/19/2020).   ZGildardo Pounds FNP-BC [facility] [place]  09/18/2020, 1:06 PM

## 2020-09-19 LAB — THYROID PANEL WITH TSH
Free Thyroxine Index: 2.9 (ref 1.2–4.9)
T3 Uptake Ratio: 32 % (ref 24–39)
T4, Total: 9 ug/dL (ref 4.5–12.0)
TSH: 0.82 u[IU]/mL (ref 0.450–4.500)

## 2020-09-19 LAB — CMP14+EGFR
ALT: 20 IU/L (ref 0–44)
AST: 16 IU/L (ref 0–40)
Albumin/Globulin Ratio: 1.4 (ref 1.2–2.2)
Albumin: 4.1 g/dL (ref 4.0–5.0)
Alkaline Phosphatase: 70 IU/L (ref 44–121)
BUN/Creatinine Ratio: 13 (ref 9–20)
BUN: 15 mg/dL (ref 6–24)
Bilirubin Total: 0.5 mg/dL (ref 0.0–1.2)
CO2: 24 mmol/L (ref 20–29)
Calcium: 9.7 mg/dL (ref 8.7–10.2)
Chloride: 102 mmol/L (ref 96–106)
Creatinine, Ser: 1.12 mg/dL (ref 0.76–1.27)
Globulin, Total: 3 g/dL (ref 1.5–4.5)
Glucose: 81 mg/dL (ref 65–99)
Potassium: 4.5 mmol/L (ref 3.5–5.2)
Sodium: 140 mmol/L (ref 134–144)
Total Protein: 7.1 g/dL (ref 6.0–8.5)
eGFR: 85 mL/min/{1.73_m2} (ref >59)

## 2020-09-19 LAB — CBC
Hematocrit: 41.4 % (ref 37.5–51.0)
Hemoglobin: 14.1 g/dL (ref 13.0–17.7)
MCH: 31.6 pg (ref 26.6–33.0)
MCHC: 34.1 g/dL (ref 31.5–35.7)
MCV: 93 fL (ref 79–97)
Platelets: 292 10*3/uL (ref 150–450)
RBC: 4.46 x10E6/uL (ref 4.14–5.80)
RDW: 12.3 % (ref 11.6–15.4)
WBC: 7.7 10*3/uL (ref 3.4–10.8)

## 2020-09-19 LAB — LIPID PANEL
Chol/HDL Ratio: 3.2 ratio (ref 0.0–5.0)
Cholesterol, Total: 149 mg/dL (ref 100–199)
HDL: 47 mg/dL (ref >39)
LDL Chol Calc (NIH): 81 mg/dL (ref 0–99)
Triglycerides: 119 mg/dL (ref 0–149)
VLDL Cholesterol Cal: 21 mg/dL (ref 5–40)

## 2020-09-19 LAB — HEMOGLOBIN A1C
Est. average glucose Bld gHb Est-mCnc: 143 mg/dL
Hgb A1c MFr Bld: 6.6 % — ABNORMAL HIGH (ref 4.8–5.6)

## 2020-09-24 ENCOUNTER — Other Ambulatory Visit (HOSPITAL_COMMUNITY): Payer: Self-pay

## 2020-09-25 ENCOUNTER — Other Ambulatory Visit (HOSPITAL_COMMUNITY): Payer: Self-pay

## 2020-10-21 ENCOUNTER — Other Ambulatory Visit (HOSPITAL_COMMUNITY): Payer: Self-pay

## 2020-11-10 ENCOUNTER — Other Ambulatory Visit (HOSPITAL_COMMUNITY): Payer: Self-pay

## 2020-11-12 ENCOUNTER — Encounter (HOSPITAL_BASED_OUTPATIENT_CLINIC_OR_DEPARTMENT_OTHER): Payer: Self-pay | Admitting: Cardiology

## 2020-11-12 ENCOUNTER — Other Ambulatory Visit: Payer: Self-pay

## 2020-11-12 ENCOUNTER — Ambulatory Visit (INDEPENDENT_AMBULATORY_CARE_PROVIDER_SITE_OTHER): Payer: No Typology Code available for payment source

## 2020-11-12 ENCOUNTER — Ambulatory Visit (INDEPENDENT_AMBULATORY_CARE_PROVIDER_SITE_OTHER): Payer: No Typology Code available for payment source | Admitting: Cardiology

## 2020-11-12 VITALS — BP 120/72 | HR 63 | Ht 74.0 in | Wt 372.2 lb

## 2020-11-12 DIAGNOSIS — Z720 Tobacco use: Secondary | ICD-10-CM

## 2020-11-12 DIAGNOSIS — E785 Hyperlipidemia, unspecified: Secondary | ICD-10-CM

## 2020-11-12 DIAGNOSIS — Z716 Tobacco abuse counseling: Secondary | ICD-10-CM

## 2020-11-12 DIAGNOSIS — E1165 Type 2 diabetes mellitus with hyperglycemia: Secondary | ICD-10-CM | POA: Diagnosis not present

## 2020-11-12 DIAGNOSIS — Z6841 Body Mass Index (BMI) 40.0 and over, adult: Secondary | ICD-10-CM

## 2020-11-12 DIAGNOSIS — R002 Palpitations: Secondary | ICD-10-CM | POA: Diagnosis not present

## 2020-11-12 NOTE — Patient Instructions (Addendum)
Medication Instructions:  Your Physician recommend you continue on your current medication as directed.    *If you need a refill on your cardiac medications before your next appointment, please call your pharmacy*   Lab Work: None ordered today   Testing/Procedures: Our physician has recommended that you wear an  7 DAY ZIO-PATCH monitor. The Zio patch cardiac monitor continuously records heart rhythm data for up to 14 days, this is for patients being evaluated for multiple types heart rhythms. For the first 24 hours post application, please avoid getting the Zio monitor wet in the shower or by excessive sweating during exercise. After that, feel free to carry on with regular activities. Keep soaps and lotions away from the ZIO XT Patch.   Someone from our office will call to verify address and mail monitor.      Follow-Up: At [facility], you and your health needs are our priority.  As part of our continuing mission to provide you with exceptional heart care, we have created designated Provider Care Teams.  These Care Teams include your primary Cardiologist (physician) and Advanced Practice Providers (APPs -  Physician Assistants and Nurse Practitioners) who all work together to provide you with the care you need, when you need it.  We recommend signing up for the patient portal called "MyChart".  Sign up information is provided on this After Visit Summary.  MyChart is used to connect with patients for Virtual Visits (Telemedicine).  Patients are able to view lab/test results, encounter notes, upcoming appointments, etc.  Non-urgent messages can be sent to your provider as well.   To learn more about what you can do with MyChart, go to [email or web address].    Your next appointment:   2 year(s)  The format for your next appointment:   In Person  Provider:   Jodelle Red, MD  Christena Deem- Long Term Monitor Instructions  Your physician has requested you wear a ZIO patch  monitor for 7 days.  This is a single patch monitor. [employer] supplies one patch monitor per enrollment. Additional stickers are not available. Please do not apply patch if you will be having a Nuclear Stress Test,  Echocardiogram, Cardiac CT, MRI, or Chest Xray during the period you would be wearing the  monitor. The patch cannot be worn during these tests. You cannot remove and re-apply the  ZIO XT patch monitor.  Your ZIO patch monitor will be mailed 3 day USPS to your address on file. It may take 3-5 days  to receive your monitor after you have been enrolled.  Once you have received your monitor, please review the enclosed instructions. Your monitor  has already been registered assigning a specific monitor serial # to you.  Billing and Patient Assistance Program Information  We have supplied [employer] with any of your insurance information on file for billing purposes. [employer] offers a sliding scale Patient Assistance Program for patients that do not have  insurance, or whose insurance does not completely cover the cost of the ZIO monitor.  You must apply for the Patient Assistance Program to qualify for this discounted rate.  To apply, please call [employer] at [phone], select option 4, select option 2, ask to apply for  Patient Assistance Program. Meredeth Ide will ask your household income, and how many people  are in your household. They will quote your out-of-pocket cost based on that information.  [employer] will also be able to set up a 81-month, interest-free payment plan if needed.  Applying the monitor  Shave hair from upper left chest.  Hold abrader disc by orange tab. Rub abrader in 40 strokes over the upper left chest as  indicated in your monitor instructions.  Clean area with 4 enclosed alcohol pads. Let dry.  Apply patch as indicated in monitor instructions. Patch will be placed under collarbone on left  side of chest with arrow pointing upward.  Rub patch adhesive wings for  2 minutes. Remove white label marked "1". Remove the white  label marked "2". Rub patch adhesive wings for 2 additional minutes.  While looking in a mirror, press and release button in center of patch. A small green light will  flash 3-4 times. This will be your only indicator that the monitor has been turned on.  Do not shower for the first 24 hours. You may shower after the first 24 hours.  Press the button if you feel a symptom. You will hear a small click. Record Date, Time and  Symptom in the Patient Logbook.  When you are ready to remove the patch, follow instructions on the last 2 pages of Patient  Logbook. Stick patch monitor onto the last page of Patient Logbook.  Place Patient Logbook in the blue and white box. Use locking tab on box and tape box closed  securely. The blue and white box has prepaid postage on it. Please place it in the mailbox as  soon as possible. Your physician should have your test results approximately 7 days after the  monitor has been mailed back to [facility].  Call [facility] Customer Care at [phone] if you have questions regarding  your ZIO XT patch monitor. Call them immediately if you see an orange light blinking on your  monitor.  If your monitor falls off in less than 4 days, contact our Monitor department at [phone].  If your monitor becomes loose or falls off after 4 days call [employer] at [phone] for  suggestions on securing your monitor

## 2020-11-12 NOTE — Progress Notes (Signed)
Cardiology Office Note:    Date:  11/12/2020   ID:  Austin Beasley, DOB 06-08-1980, MRN [id number]  PCP:  Gildardo Pounds, NP  Cardiologist:  Buford Dresser, MD  Referring MD: Gildardo Pounds, NP   CC: new patient consultation for palpitations  History of Present Illness:    Austin Beasley is a 40 y.o. male with a hx of type II diabetes, morbid obesity, hypothyroidism, dyslipidemia, tobacco use who is seen as a new consult at the request of Gildardo Pounds, NP for the evaluation and management of palpitations.  Note from 09/18/20 from [facility] reviewed. Noted intermittent palpitations, several times/week, seconds per episode. Improved but not resolved on atenolol. Referred to cardiology for further evaluation.  Today: Main concern is palpitations. No other cardiac concerns at this time.  Tachycardia/palpitations: -Initial onset: about a year or so ago -Frequency/Duration: happens a few times a week, lasts about 60 seconds. Can happen at any time -Associated symptoms: no clear -Aggravating/alleviating factors: no clear factors now. Initially atenolol helped but now does not seem to affect them. -Syncope/near syncope: none -Prior cardiac history: none -Prior workup: none -Prior treatment: none -Possible medication interactions: nicotine patch, levothyroxine -Caffeine: little to none -Alcohol: little to none -Tobacco: 10-15 cigarettes/day. Patches didn't help, gum didn't help -OTC supplements: none -Comorbidities: type II diabetes, morbid obesity, hypothyroidism, tobacco abuse, hyperlipidemia -Exercise level: takes 10-14k steps/day with his job. Was going to the gym 3x/week but not as frequently recently. No limitations. -Labs: TSH, kidney function/electrolytes, CBC reviewed. -Cardiac ROS: no chest pain, no shortness of breath, no PND, no orthopnea, no LE edema. -Family history:  no heart issues or stroke on either side of his family.  Past Medical History:   Diagnosis Date   Diabetes mellitus without complication (HCC)    High cholesterol    Hypertension    Obesity    Palpitations    Thyroid disease     Past Surgical History:  Procedure Laterality Date   ELEVATION OF DEPRESSED SKULL FRACTURE     age 7    Current Medications: Current Outpatient Medications on File Prior to Visit  Medication Sig   atenolol (TENORMIN) 50 MG tablet Take 1 tablet (50 mg total) by mouth daily.   Blood Glucose Monitoring Suppl (FREESTYLE LITE) DEVI Use as instructed. Check blood glucose level by fingerstick twice per day.  E11.65   gabapentin (NEURONTIN) 300 MG capsule Take 1 capsule (300 mg total) by mouth 3 (three) times daily.   glucose blood test strip CHECK BLOOD GLUCOSE TWICE PER DAY AS INSTRUCTED   ibuprofen (ADVIL) 800 MG tablet TAKE 1 TABLET (800 MG TOTAL) BY MOUTH 3 (THREE) TIMES DAILY.   Lancets (FREESTYLE) lancets CHECK BLOOD GLUCOSE TWICE PER DAY AS INSTRUCTED   levothyroxine (SYNTHROID) 175 MCG tablet Take 1 tablet (175 mcg total) by mouth daily before breakfast   metFORMIN (GLUCOPHAGE) 850 MG tablet Take 1 tablet (850 mg total) by mouth daily with breakfast.   methocarbamol (ROBAXIN) 500 MG tablet Take 1 tablet (500 mg total) by mouth 2 (two) times daily.   mupirocin ointment (BACTROBAN) 2 % APPLY TOPICALLY TO AFFECTED AREA TWICE DAILY FOR 1 WEEK AS NEEDED FOR BOILS.   Nicotine 21-14-7 MG/24HR KIT 63m patch for 4 weeks; 14 mg patch for 2 weeks; 7 mg patch for 2 weeks. Place one patch on skin for 24 hours.   omeprazole (PRILOSEC) 20 MG capsule Take 1 capsule (20 mg total) by mouth daily.   senna-docusate (  SENOKOT-S) 8.6-50 MG tablet Take 2 tablets by mouth 2 (two) times daily as needed for mild constipation or moderate constipation.   simvastatin (ZOCOR) 10 MG tablet Take 1 tablet (10 mg total) by mouth at bedtime.   No current facility-administered medications on file prior to visit.     Allergies:   Patient has no known allergies.    Social History   Tobacco Use   Smoking status: Some Days    Types: Cigarettes   Smokeless tobacco: Never  Vaping Use   Vaping Use: Never used  Substance Use Topics   Alcohol use: Yes    Comment: occ   Drug use: Yes    Types: Marijuana    Comment: occ    Family History: family history includes Birth defects in his maternal grandfather, maternal grandmother, paternal grandfather, and paternal grandmother; Cancer in his mother and sister; Diabetes in his mother; Hypertension in his father.  ROS:   Please see the history of present illness.  Additional pertinent ROS: Constitutional: Negative for chills, fever, night sweats, unintentional weight loss  HENT: Negative for ear pain and hearing loss.   Eyes: Negative for loss of vision and eye pain.  Respiratory: Negative for cough, sputum, wheezing.   Cardiovascular: See HPI. Gastrointestinal: Negative for abdominal pain, melena, and hematochezia.  Genitourinary: Negative for dysuria and hematuria.  Musculoskeletal: Negative for falls and myalgias.  Skin: Negative for itching and rash.  Neurological: Negative for focal weakness, focal sensory changes and loss of consciousness.  Endo/Heme/Allergies: Does not bruise/bleed easily.     EKGs/Labs/Other Studies Reviewed:    The following studies were reviewed today: none  EKG:  EKG is personally reviewed.   11/12/20 sinus rhythm at 63 bpm, nonspecific ST pattern  Recent Labs: 09/18/2020: ALT 20; BUN 15; Creatinine, Ser 1.12; Hemoglobin 14.1; Platelets 292; Potassium 4.5; Sodium 140; TSH 0.820  Recent Lipid Panel    Component Value Date/Time   CHOL 149 09/18/2020 1131   TRIG 119 09/18/2020 1131   HDL 47 09/18/2020 1131   CHOLHDL 3.2 09/18/2020 1131   CHOLHDL 3.7 Ratio 06/16/2009 2114   VLDL 23 06/16/2009 2114   LDLCALC 81 09/18/2020 1131    Physical Exam:    VS:  BP 120/72   Pulse 63   Ht '6\' 2"'  (1.88 m)   Wt (!) 372 lb 3.2 oz (168.8 kg)   SpO2 96%   BMI 47.79 kg/m      Wt Readings from Last 3 Encounters:  11/12/20 (!) 372 lb 3.2 oz (168.8 kg)  09/18/20 (!) 378 lb (171.5 kg)  08/03/20 (!) 350 lb (158.8 kg)    GEN: Well nourished, well developed in no acute distress HEENT: Normal, moist mucous membranes NECK: No JVD CARDIAC: regular rhythm, normal S1 and S2, no rubs or gallops. No murmur. VASCULAR: Radial and DP pulses 2+ bilaterally. No carotid bruits RESPIRATORY:  Clear to auscultation without rales, wheezing or rhonchi  ABDOMEN: Soft, non-tender, non-distended MUSCULOSKELETAL:  Ambulates independently SKIN: Warm and dry, no edema NEUROLOGIC:  Alert and oriented x 3. No focal neuro deficits noted. PSYCHIATRIC:  Normal affect    ASSESSMENT:    1. Palpitation   2. Type 2 diabetes mellitus with hyperglycemia, without long-term current use of insulin (HCC)   3. Class 3 severe obesity due to excess calories with serious comorbidity and body mass index (BMI) of 50.0 to 59.9 in adult ([place])   4. Hyperlipidemia, unspecified hyperlipidemia type   5. Tobacco abuse counseling   6.  Tobacco use    PLAN:    Palpitations -we discussed the potential causes of fast heart rates and palpitations today. Reviewed the normal electrical system of the heart. Reviewed the role of the sinus node. Reviewed the balance between resting (vagal) tone and fight or flight nervous system input. Reviewed how exercise improves vagal tone and lowers resting heart rate. Reviewed that sinus tachycardia, or elevated sinus rate, is usually secondary to something else in the body. This can include pain, stress, infection, anxiety, hormone imbalance, low blood counts, etc. Discussed that we do not typically treat sinus tachycardia by itself, and instead the focus is on finding what is driving the heart rate and treating that. We discussed that there can be other rhythm issues, from either the top or bottom of the heart, that are abnormal rhythms. Discussed how we evaluate for these.  -we  will set him up with a Zio monitor. He is going to be baptized this weekend, so to avoid submerging the monitor, we will mail it to his house -ECG unremarkable today  CV risk factors, including Type II diabetes: last A1c 6.6, on metformin  Dyslipidemia: Lipids 09/18/20: Tchol 149, HDL 47, LDL 81, TG 119. On simvastatin  Morbid obesity: discussed benefit of weight loss. GLP1RA may be a good option for him  Tobacco use: counseled on cessation today. The patient was counseled on tobacco cessation today for 3 minutes.  Counseling included reviewing the risks of smoking tobacco products, how it impacts the patient's current medical diagnoses and different strategies for quitting.  Pharmacotherapy to aid in tobacco cessation was not prescribed today.   Cardiac risk counseling and prevention recommendations: -recommend heart healthy/Mediterranean diet, with whole grains, fruits, vegetable, fish, lean meats, nuts, and olive oil. Limit salt. -recommend moderate walking, 3-5 times/week for 30-50 minutes each session. Aim for at least 150 minutes.week. Goal should be pace of 3 miles/hours, or walking 1.5 miles in 30 minutes -recommend avoidance of tobacco products. Avoid excess alcohol. -ASCVD risk score: The 10-year ASCVD risk score (Arnett DK, et al., 2019) is: 8.3%   Values used to calculate the score:     Age: 25 years     Sex: Male     Is Non-Hispanic African American: Yes     Diabetic: Yes     Tobacco smoker: Yes     Systolic Blood Pressure: 564 mmHg     Is BP treated: No     HDL Cholesterol: 47 mg/dL     Total Cholesterol: 149 mg/dL    Plan for follow up: if monitor unremarkable, follow up in 2 years for management of risk factors  Buford Dresser, MD, PhD, [facility]    Medication Adjustments/Labs and Tests Ordered: Current medicines are reviewed at length with the patient today.  Concerns regarding medicines are outlined above.  Orders Placed This  Encounter  Procedures   LONG TERM MONITOR (3-14 DAYS)   EKG 12-Lead    No orders of the defined types were placed in this encounter.   Patient Instructions  Medication Instructions:  Your Physician recommend you continue on your current medication as directed.    *If you need a refill on your cardiac medications before your next appointment, please call your pharmacy*   Lab Work: None ordered today   Testing/Procedures: Our physician has recommended that you wear an  [place] monitor. The Zio patch cardiac monitor continuously records heart rhythm data for up to 14 days, this is  for patients being evaluated for multiple types heart rhythms. For the first 24 hours post application, please avoid getting the Zio monitor wet in the shower or by excessive sweating during exercise. After that, feel free to carry on with regular activities. Keep soaps and lotions away from the ZIO XT Patch.   Someone from our office will call to verify address and mail monitor.      Follow-Up: At [facility], you and your health needs are our priority.  As part of our continuing mission to provide you with exceptional heart care, we have created designated Provider Care Teams.  These Care Teams include your primary Cardiologist (physician) and Advanced Practice Providers (APPs -  Physician Assistants and Nurse Practitioners) who all work together to provide you with the care you need, when you need it.  We recommend signing up for the patient portal called "MyChart".  Sign up information is provided on this After Visit Summary.  MyChart is used to connect with patients for Virtual Visits (Telemedicine).  Patients are able to view lab/test results, encounter notes, upcoming appointments, etc.  Non-urgent messages can be sent to your provider as well.   To learn more about what you can do with MyChart, go to [email or web address].    Your next appointment:   2 year(s)  The format for your next  appointment:   In Person  Provider:   Buford Dresser, MD  [place] Instructions  Your physician has requested you wear a ZIO patch monitor for 7 days.  This is a single patch monitor. [employer] supplies one patch monitor per enrollment. Additional stickers are not available. Please do not apply patch if you will be having a Nuclear Stress Test,  Echocardiogram, Cardiac CT, MRI, or Chest Xray during the period you would be wearing the  monitor. The patch cannot be worn during these tests. You cannot remove and re-apply the  ZIO XT patch monitor.  Your ZIO patch monitor will be mailed 3 day USPS to your address on file. It may take 3-5 days  to receive your monitor after you have been enrolled.  Once you have received your monitor, please review the enclosed instructions. Your monitor  has already been registered assigning a specific monitor serial # to you.  Billing and Patient Assistance Program Information  We have supplied [employer] with any of your insurance information on file for billing purposes. [employer] offers a sliding scale Patient Assistance Program for patients that do not have  insurance, or whose insurance does not completely cover the cost of the ZIO monitor.  You must apply for the Patient Assistance Program to qualify for this discounted rate.  To apply, please call [employer] at [phone], select option 4, select option 2, ask to apply for  Patient Assistance Program. Theodore Demark will ask your household income, and how many people  are in your household. They will quote your out-of-pocket cost based on that information.  [employer] will also be able to set up a 72-month interest-free payment plan if needed.  Applying the monitor   Shave hair from upper left chest.  Hold abrader disc by orange tab. Rub abrader in 40 strokes over the upper left chest as  indicated in your monitor instructions.  Clean area with 4 enclosed alcohol pads. Let dry.  Apply  patch as indicated in monitor instructions. Patch will be placed under collarbone on left  side of chest with arrow pointing upward.  Rub patch adhesive wings for 2  minutes. Remove white label marked "1". Remove the white  label marked "2". Rub patch adhesive wings for 2 additional minutes.  While looking in a mirror, press and release button in center of patch. A small green light will  flash 3-4 times. This will be your only indicator that the monitor has been turned on.  Do not shower for the first 24 hours. You may shower after the first 24 hours.  Press the button if you feel a symptom. You will hear a small click. Record Date, Time and  Symptom in the Patient Logbook.  When you are ready to remove the patch, follow instructions on the last 2 pages of Patient  Logbook. Stick patch monitor onto the last page of Patient Logbook.  Place Patient Logbook in the blue and white box. Use locking tab on box and tape box closed  securely. The blue and white box has prepaid postage on it. Please place it in the mailbox as  soon as possible. Your physician should have your test results approximately 7 days after the  monitor has been mailed back to [facility].  Call [place] at [phone] if you have questions regarding  your ZIO XT patch monitor. Call them immediately if you see an orange light blinking on your  monitor.  If your monitor falls off in less than 4 days, contact our Monitor department at [phone].  If your monitor becomes loose or falls off after 4 days call [employer] at [phone] for  suggestions on securing your monitor    Signed, Buford Dresser, MD PhD 11/12/2020   [place]

## 2020-11-12 NOTE — Progress Notes (Unsigned)
Enrolled patient for a 7 day Zio XT monitor to be mailed to patients home.  

## 2020-11-17 DIAGNOSIS — R002 Palpitations: Secondary | ICD-10-CM | POA: Diagnosis not present

## 2020-11-25 ENCOUNTER — Other Ambulatory Visit (HOSPITAL_COMMUNITY): Payer: Self-pay

## 2020-12-15 ENCOUNTER — Other Ambulatory Visit (HOSPITAL_COMMUNITY): Payer: Self-pay

## 2020-12-17 ENCOUNTER — Other Ambulatory Visit (HOSPITAL_COMMUNITY): Payer: Self-pay

## 2020-12-17 MED ORDER — IBUPROFEN 800 MG PO TABS
800.0000 mg | ORAL_TABLET | Freq: Four times a day (QID) | ORAL | 0 refills | Status: DC | PRN
  Filled 2020-12-17: qty 18, 5d supply, fill #0

## 2020-12-17 MED ORDER — AMOXICILLIN 500 MG PO CAPS
500.0000 mg | ORAL_CAPSULE | Freq: Three times a day (TID) | ORAL | 0 refills | Status: DC
  Filled 2020-12-17: qty 18, 6d supply, fill #0

## 2020-12-17 MED ORDER — TRAMADOL HCL 50 MG PO TABS
50.0000 mg | ORAL_TABLET | Freq: Four times a day (QID) | ORAL | 0 refills | Status: DC
  Filled 2020-12-17: qty 10, 3d supply, fill #0

## 2020-12-18 ENCOUNTER — Other Ambulatory Visit (HOSPITAL_COMMUNITY): Payer: Self-pay

## 2020-12-21 ENCOUNTER — Ambulatory Visit: Payer: No Typology Code available for payment source | Attending: Nurse Practitioner | Admitting: Nurse Practitioner

## 2020-12-21 ENCOUNTER — Other Ambulatory Visit (HOSPITAL_COMMUNITY): Payer: Self-pay

## 2020-12-21 ENCOUNTER — Encounter: Payer: Self-pay | Admitting: Nurse Practitioner

## 2020-12-21 ENCOUNTER — Other Ambulatory Visit: Payer: Self-pay

## 2020-12-21 VITALS — BP 119/75 | HR 57 | Ht 74.0 in | Wt 382.4 lb

## 2020-12-21 DIAGNOSIS — Z23 Encounter for immunization: Secondary | ICD-10-CM | POA: Diagnosis not present

## 2020-12-21 DIAGNOSIS — R002 Palpitations: Secondary | ICD-10-CM | POA: Diagnosis not present

## 2020-12-21 DIAGNOSIS — K5903 Drug induced constipation: Secondary | ICD-10-CM

## 2020-12-21 DIAGNOSIS — K2100 Gastro-esophageal reflux disease with esophagitis, without bleeding: Secondary | ICD-10-CM

## 2020-12-21 DIAGNOSIS — E038 Other specified hypothyroidism: Secondary | ICD-10-CM

## 2020-12-21 DIAGNOSIS — E1165 Type 2 diabetes mellitus with hyperglycemia: Secondary | ICD-10-CM | POA: Diagnosis not present

## 2020-12-21 DIAGNOSIS — E785 Hyperlipidemia, unspecified: Secondary | ICD-10-CM

## 2020-12-21 MED ORDER — METFORMIN HCL 850 MG PO TABS
850.0000 mg | ORAL_TABLET | Freq: Every day | ORAL | 1 refills | Status: DC
  Filled 2020-12-21 – 2021-01-15 (×2): qty 90, 90d supply, fill #0
  Filled 2021-04-09: qty 30, 30d supply, fill #1
  Filled 2021-05-13: qty 30, 30d supply, fill #2
  Filled 2021-06-11: qty 30, 30d supply, fill #3

## 2020-12-21 MED ORDER — GABAPENTIN 300 MG PO CAPS
300.0000 mg | ORAL_CAPSULE | Freq: Three times a day (TID) | ORAL | 1 refills | Status: DC
  Filled 2020-12-21: qty 270, 90d supply, fill #0
  Filled 2021-02-15: qty 180, 60d supply, fill #0
  Filled 2021-08-12: qty 180, 60d supply, fill #1

## 2020-12-21 MED ORDER — ATENOLOL 50 MG PO TABS
50.0000 mg | ORAL_TABLET | Freq: Every day | ORAL | 1 refills | Status: DC
  Filled 2020-12-21 – 2021-01-23 (×2): qty 90, 90d supply, fill #0
  Filled 2021-04-09: qty 30, 30d supply, fill #1
  Filled 2021-05-13: qty 30, 30d supply, fill #2
  Filled 2021-06-11: qty 30, 30d supply, fill #3

## 2020-12-21 MED ORDER — GLUCOSE BLOOD VI STRP
ORAL_STRIP | 2 refills | Status: DC
  Filled 2020-12-21 – 2021-08-12 (×3): qty 100, 50d supply, fill #0

## 2020-12-21 MED ORDER — OMEPRAZOLE 20 MG PO CPDR
20.0000 mg | DELAYED_RELEASE_CAPSULE | Freq: Every day | ORAL | 0 refills | Status: DC
Start: 2020-12-21 — End: 2021-04-22
  Filled 2020-12-21: qty 90, 90d supply, fill #0
  Filled 2020-12-25: qty 30, 30d supply, fill #0
  Filled 2021-02-02: qty 60, 60d supply, fill #1

## 2020-12-21 MED ORDER — LEVOTHYROXINE SODIUM 175 MCG PO TABS
175.0000 ug | ORAL_TABLET | ORAL | 2 refills | Status: DC
Start: 2020-12-21 — End: 2021-07-07
  Filled 2020-12-21: qty 90, 90d supply, fill #0
  Filled 2020-12-25: qty 30, 30d supply, fill #0
  Filled 2021-01-23: qty 30, 30d supply, fill #1
  Filled 2021-02-15: qty 60, 60d supply, fill #2
  Filled 2021-04-09: qty 30, 30d supply, fill #3
  Filled 2021-05-13: qty 30, 30d supply, fill #4
  Filled 2021-06-11: qty 30, 30d supply, fill #5

## 2020-12-21 MED ORDER — SIMVASTATIN 10 MG PO TABS
10.0000 mg | ORAL_TABLET | Freq: Every day | ORAL | 3 refills | Status: DC
  Filled 2020-12-21: qty 90, 90d supply, fill #0
  Filled 2020-12-25: qty 30, 30d supply, fill #0
  Filled 2021-02-15: qty 60, 60d supply, fill #1
  Filled 2021-04-28: qty 90, 90d supply, fill #2
  Filled 2021-07-23: qty 90, 90d supply, fill #3

## 2020-12-21 MED ORDER — SENNOSIDES-DOCUSATE SODIUM 8.6-50 MG PO TABS
2.0000 | ORAL_TABLET | Freq: Two times a day (BID) | ORAL | 1 refills | Status: DC | PRN
  Filled 2020-12-21: qty 60, 15d supply, fill #0

## 2020-12-21 NOTE — Progress Notes (Signed)
Assessment & Plan:  Austin Beasley was seen today for diabetes.  Diagnoses and all orders for this visit:  Type 2 diabetes mellitus with hyperglycemia, without long-term current use of insulin (HCC) -     gabapentin (NEURONTIN) 300 MG capsule; Take 1 capsule (300 mg total) by mouth 3 (three) times daily. -     glucose blood test strip; CHECK BLOOD GLUCOSE TWICE PER DAY AS INSTRUCTED -     metFORMIN (GLUCOPHAGE) 850 MG tablet; Take 1 tablet (850 mg total) by mouth daily with breakfast. -     Hemoglobin A1c -     CMP14+EGFR -     Ambulatory referral to Podiatry Continue blood sugar control as discussed in office today, low carbohydrate diet, and regular physical exercise as tolerated, 150 minutes per week (30 min each day, 5 days per week, or 50 min 3 days per week). Keep blood sugar logs with fasting goal of 90-130 mg/dl, post prandial (after you eat) less than 180.  For Hypoglycemia: BS <60 and Hyperglycemia BS >400; contact the clinic ASAP. Annual eye exams and foot exams are recommended.   Intermittent palpitations -     atenolol (TENORMIN) 50 MG tablet; Take 1 tablet (50 mg total) by mouth daily.  Other specified hypothyroidism -     levothyroxine (SYNTHROID) 175 MCG tablet; Take 1 tablet (175 mcg total) by mouth daily before breakfast  Gastroesophageal reflux disease with esophagitis without hemorrhage -     omeprazole (PRILOSEC) 20 MG capsule; Take 1 capsule (20 mg total) by mouth daily. INSTRUCTIONS: Avoid GERD Triggers: acidic, spicy or fried foods, caffeine, coffee, sodas,  alcohol and chocolate.    Dyslipidemia, goal LDL below 70 -     simvastatin (ZOCOR) 10 MG tablet; Take 1 tablet (10 mg total) by mouth at bedtime. INSTRUCTIONS: Work on a low fat, heart healthy diet and participate in regular aerobic exercise program by working out at least 150 minutes per week; 5 days a week-30 minutes per day. Avoid red meat/beef/steak,  fried foods. junk foods, sodas, sugary drinks, unhealthy  snacking, alcohol and smoking.  Drink at least 80 oz of water per day and monitor your carbohydrate intake daily.    Constipation due to pain medication -     senna-docusate (SENOKOT-S) 8.6-50 MG tablet; Take 2 tablets by mouth 2 (two) times daily as needed for mild constipation or moderate constipation.  Need for immunization against influenza -     Flu Vaccine QUAD 34moIM (Fluarix, Fluzone & Alfiuria Quad PF)   Patient has been counseled on age-appropriate routine health concerns for screening and prevention. These are reviewed and up-to-date. Referrals have been placed accordingly. Immunizations are up-to-date or declined.    Subjective:   Chief Complaint  Patient presents with  . Diabetes   HPI Austin [id number].o. male presents to office today for up. He has a past medical history of DM2,  High cholesterol, Hypertension, Obesity, Palpitations, and Thyroid disease.   DM Well controlled however A1c is slightly increased and weight is trending up.  He endorses adherence taking metformin 850 mg daily.  Denies any symptoms of hypo or hyperglycemia.  LDL not quite at goal with simvastatin 10 mg daily. Lab Results  Component Value Date   HGBA1C 6.6 (H) 09/18/2020    Lab Results  Component Value Date   LDLCALC 81 09/18/2020    Intermittent palpitations He is currently awaiting ZIO XT monitor results from cardiology.  Current medications include Tenormin 50 mg daily.  Denies chest pain, shortness of breath,  lightheadedness, dizziness, headaches or BLE edema.      Review of Systems  Constitutional:  Negative for fever, malaise/fatigue and weight loss.  HENT: Negative.  Negative for nosebleeds.   Eyes: Negative.  Negative for blurred vision, double vision and photophobia.  Respiratory: Negative.  Negative for cough and shortness of breath.   Cardiovascular:  Positive for palpitations. Negative for chest pain and leg swelling.  Gastrointestinal:  Positive for constipation and  heartburn. Negative for abdominal pain, blood in stool, diarrhea, melena, nausea and vomiting.  Musculoskeletal: Negative.  Negative for myalgias.  Neurological: Negative.  Negative for dizziness, focal weakness, seizures and headaches.  Psychiatric/Behavioral: Negative.  Negative for suicidal ideas.    Past Medical History:  Diagnosis Date  . Diabetes mellitus without complication ([place])   . High cholesterol   . Hypertension   . Obesity   . Palpitations   . Thyroid disease     Past Surgical History:  Procedure Laterality Date  . ELEVATION OF DEPRESSED SKULL FRACTURE     age 40    Family History  Problem Relation Age of Onset  . Diabetes Mother   . Cancer Mother   . Hypertension Father   . Cancer Sister   . Birth defects Maternal Grandmother   . Birth defects Maternal Grandfather   . Birth defects Paternal Grandmother   . Birth defects Paternal Grandfather     Social History Reviewed with no changes to be made today.   Outpatient Medications Prior to Visit  Medication Sig Dispense Refill  . amoxicillin (AMOXIL) 500 MG capsule Take 1 capsule (500 mg total) by mouth 3 (three) times daily. 18 capsule 0  . Blood Glucose Monitoring Suppl (FREESTYLE LITE) DEVI Use as instructed. Check blood glucose level by fingerstick twice per day.  E11.65 1 each 0  . ibuprofen (ADVIL) 800 MG tablet Take 1 tablet (800 mg total) by mouth every 6-8 hours as needed. 18 tablet 0  . Lancets (FREESTYLE) lancets CHECK BLOOD GLUCOSE TWICE PER DAY AS INSTRUCTED 100 each 2  . methocarbamol (ROBAXIN) 500 MG tablet Take 1 tablet (500 mg total) by mouth 2 (two) times daily. 20 tablet 0  . mupirocin ointment (BACTROBAN) 2 % APPLY TOPICALLY TO AFFECTED AREA TWICE DAILY FOR 1 WEEK AS NEEDED FOR BOILS. 22 g 2  . Nicotine 21-14-7 MG/24HR KIT $RemoveBef'21mg'[id number]$  patch for 4 weeks; 14 mg patch for 2 weeks; 7 mg patch for 2 weeks. Place one patch on skin for 24 hours. 1 kit 0  . traMADol (ULTRAM) 50 MG tablet Take 1 tablet (50 mg  total) by mouth every 6 (six) hours. 10 tablet 0  . atenolol (TENORMIN) 50 MG tablet Take 1 tablet (50 mg total) by mouth daily. 90 tablet 1  . gabapentin (NEURONTIN) 300 MG capsule Take 1 capsule (300 mg total) by mouth 3 (three) times daily. 270 capsule 1  . glucose blood test strip CHECK BLOOD GLUCOSE TWICE PER DAY AS INSTRUCTED 100 strip 2  . levothyroxine (SYNTHROID) 175 MCG tablet Take 1 tablet (175 mcg total) by mouth daily before breakfast 90 tablet 2  . metFORMIN (GLUCOPHAGE) 850 MG tablet Take 1 tablet (850 mg total) by mouth daily with breakfast. 90 tablet 1  . omeprazole (PRILOSEC) 20 MG capsule Take 1 capsule (20 mg total) by mouth daily. 90 capsule 0  . senna-docusate (SENOKOT-S) 8.6-50 MG tablet Take 2 tablets by mouth 2 (two) times daily as needed for mild constipation  or moderate constipation. 60 tablet 1  . simvastatin (ZOCOR) 10 MG tablet Take 1 tablet (10 mg total) by mouth at bedtime. 90 tablet 3   No facility-administered medications prior to visit.    No Known Allergies     Objective:    BP 119/75   Pulse (!) 57   Ht _0  (1.88 m)   Wt (!) 382 lb 6 oz (173.4 kg)   SpO2 96%   BMI 49.09 kg/m  Wt Readings from Last 3 Encounters:  12/21/20 (!) 382 lb 6 oz (173.4 kg)  11/12/20 (!) 372 lb 3.2 oz (168.8 kg)  09/18/20 (!) 378 lb (171.5 kg)    Physical Exam Vitals and nursing note reviewed.  Constitutional:      Appearance: He is well-developed.  HENT:     Head: Normocephalic and atraumatic.  Cardiovascular:     Rate and Rhythm: Bradycardia present.     Heart sounds: Normal heart sounds. No murmur heard.   No friction rub. No gallop.  Pulmonary:     Effort: Pulmonary effort is normal. No tachypnea or respiratory distress.     Breath sounds: Normal breath sounds. No decreased breath sounds, wheezing, rhonchi or rales.  Chest:     Chest wall: No tenderness.  Abdominal:     General: Bowel sounds are normal.     Palpations: Abdomen is soft.   Musculoskeletal:        General: Normal range of motion.     Cervical back: Normal range of motion.  Skin:    General: Skin is warm and dry.  Neurological:     Mental Status: He is alert and oriented to person, place, and time.     Coordination: Coordination normal.  Psychiatric:        Behavior: Behavior normal. Behavior is cooperative.        Thought Content: Thought content normal.        Judgment: Judgment normal.         Patient has been counseled extensively about nutrition and exercise as well as the importance of adherence with medications and regular follow-up. The patient was given clear instructions to go to ER or return to medical center if symptoms don't improve, worsen or new problems develop. The patient verbalized understanding.   Follow-up: Return in about 3 months (around 03/23/2021).   Gildardo Pounds, FNP-BC [facility] [place], [place]   12/21/2020, 1:06 PM

## 2020-12-22 LAB — CMP14+EGFR
ALT: 18 IU/L (ref 0–44)
AST: 16 IU/L (ref 0–40)
Albumin/Globulin Ratio: 1.4 (ref 1.2–2.2)
Albumin: 4.1 g/dL (ref 4.0–5.0)
Alkaline Phosphatase: 58 IU/L (ref 44–121)
BUN/Creatinine Ratio: 15 (ref 9–20)
BUN: 14 mg/dL (ref 6–24)
Bilirubin Total: 0.4 mg/dL (ref 0.0–1.2)
CO2: 26 mmol/L (ref 20–29)
Calcium: 9.6 mg/dL (ref 8.7–10.2)
Chloride: 101 mmol/L (ref 96–106)
Creatinine, Ser: 0.96 mg/dL (ref 0.76–1.27)
Globulin, Total: 3 g/dL (ref 1.5–4.5)
Glucose: 143 mg/dL — ABNORMAL HIGH (ref 70–99)
Potassium: 4.5 mmol/L (ref 3.5–5.2)
Sodium: 138 mmol/L (ref 134–144)
Total Protein: 7.1 g/dL (ref 6.0–8.5)
eGFR: 102 mL/min/{1.73_m2} (ref >59)

## 2020-12-22 LAB — HEMOGLOBIN A1C
Est. average glucose Bld gHb Est-mCnc: 137 mg/dL
Hgb A1c MFr Bld: 6.4 % — ABNORMAL HIGH (ref 4.8–5.6)

## 2020-12-25 ENCOUNTER — Other Ambulatory Visit (HOSPITAL_COMMUNITY): Payer: Self-pay

## 2020-12-31 ENCOUNTER — Other Ambulatory Visit (HOSPITAL_COMMUNITY): Payer: Self-pay

## 2020-12-31 ENCOUNTER — Other Ambulatory Visit (HOSPITAL_BASED_OUTPATIENT_CLINIC_OR_DEPARTMENT_OTHER): Payer: Self-pay

## 2020-12-31 MED ORDER — PENICILLIN V POTASSIUM 500 MG PO TABS
500.0000 mg | ORAL_TABLET | Freq: Four times a day (QID) | ORAL | 0 refills | Status: DC
  Filled 2020-12-31: qty 28, 7d supply, fill #0

## 2020-12-31 MED ORDER — HYDROCODONE-ACETAMINOPHEN 5-325 MG PO TABS
1.0000 | ORAL_TABLET | ORAL | 0 refills | Status: DC
  Filled 2020-12-31: qty 14, 3d supply, fill #0

## 2020-12-31 MED ORDER — HYDROCODONE-ACETAMINOPHEN 5-325 MG PO TABS
1.0000 | ORAL_TABLET | Freq: Four times a day (QID) | ORAL | 0 refills | Status: DC | PRN
  Filled 2020-12-31: qty 14, 4d supply, fill #0

## 2021-01-05 ENCOUNTER — Other Ambulatory Visit (HOSPITAL_COMMUNITY): Payer: Self-pay

## 2021-01-05 MED ORDER — IBUPROFEN 800 MG PO TABS
800.0000 mg | ORAL_TABLET | Freq: Four times a day (QID) | ORAL | 0 refills | Status: DC | PRN
  Filled 2021-01-05 – 2021-01-25 (×2): qty 18, 5d supply, fill #0

## 2021-01-11 ENCOUNTER — Other Ambulatory Visit (HOSPITAL_COMMUNITY): Payer: Self-pay

## 2021-01-11 MED ORDER — FLURBIPROFEN 100 MG PO TABS
100.0000 mg | ORAL_TABLET | Freq: Two times a day (BID) | ORAL | 0 refills | Status: DC
  Filled 2021-01-11: qty 14, 7d supply, fill #0

## 2021-01-15 ENCOUNTER — Other Ambulatory Visit (HOSPITAL_COMMUNITY): Payer: Self-pay

## 2021-01-25 ENCOUNTER — Other Ambulatory Visit (HOSPITAL_COMMUNITY): Payer: Self-pay

## 2021-02-01 ENCOUNTER — Encounter: Payer: Self-pay | Admitting: Podiatry

## 2021-02-01 ENCOUNTER — Other Ambulatory Visit: Payer: Self-pay

## 2021-02-01 ENCOUNTER — Ambulatory Visit (INDEPENDENT_AMBULATORY_CARE_PROVIDER_SITE_OTHER): Payer: No Typology Code available for payment source | Admitting: Podiatry

## 2021-02-01 DIAGNOSIS — M79675 Pain in left toe(s): Secondary | ICD-10-CM

## 2021-02-01 DIAGNOSIS — M79674 Pain in right toe(s): Secondary | ICD-10-CM | POA: Diagnosis not present

## 2021-02-01 DIAGNOSIS — B351 Tinea unguium: Secondary | ICD-10-CM

## 2021-02-01 DIAGNOSIS — E119 Type 2 diabetes mellitus without complications: Secondary | ICD-10-CM

## 2021-02-01 NOTE — Progress Notes (Signed)
This patient presents to the office with chief complaint of long thick nails and diabetic feet.  This patient  says there  is  no pain and discomfort in his  feet.  This patient says there are long thick painful nails.  These nails are painful walking and wearing shoes.  Patient has no history of infection or drainage from both feet.  Patient is unable to  self treat his own nails . This patient presents  to the office today for treatment of the  long nails and a foot evaluation due to history of  diabetes.  General Appearance  Alert, conversant and in no acute stress.  Vascular  Dorsalis pedis and posterior tibial  pulses are palpable  bilaterally.  Capillary return is within normal limits  bilaterally. Temperature is within normal limits  bilaterally.  Neurologic  Senn-Weinstein monofilament wire test within normal limits  bilaterally. Muscle power within normal limits bilaterally.  Nails Thick disfigured discolored nails with subungual debris  from hallux to fifth toes bilaterally. No evidence of bacterial infection or drainage bilaterally.  Orthopedic  No limitations of motion of motion feet .  No crepitus or effusions noted.  No bony pathology or digital deformities noted.  Skin  normotropic skin with no porokeratosis noted bilaterally.  No signs of infections or ulcers noted.     Onychomycosis  Diabetes with no foot complications  IE  Debride nails x 10 with nail nipper followed by dremel tool.  A diabetic foot exam was performed and there is no evidence of any vascular or neurologic pathology.   RTC 4  months.   Helane Gunther DPM

## 2021-02-02 ENCOUNTER — Other Ambulatory Visit (HOSPITAL_COMMUNITY): Payer: Self-pay

## 2021-02-15 ENCOUNTER — Other Ambulatory Visit (HOSPITAL_COMMUNITY): Payer: Self-pay

## 2021-02-17 ENCOUNTER — Other Ambulatory Visit (HOSPITAL_COMMUNITY): Payer: Self-pay

## 2021-02-18 ENCOUNTER — Other Ambulatory Visit (HOSPITAL_COMMUNITY): Payer: Self-pay

## 2021-03-12 ENCOUNTER — Other Ambulatory Visit (HOSPITAL_COMMUNITY): Payer: Self-pay

## 2021-03-12 MED ORDER — CHLORHEXIDINE GLUCONATE 0.12 % MT SOLN
OROMUCOSAL | 0 refills | Status: DC
  Filled 2021-03-12: qty 473, 16d supply, fill #0

## 2021-03-12 MED ORDER — FLURBIPROFEN 100 MG PO TABS
100.0000 mg | ORAL_TABLET | Freq: Two times a day (BID) | ORAL | 0 refills | Status: DC
  Filled 2021-03-12: qty 14, 7d supply, fill #0

## 2021-03-24 ENCOUNTER — Ambulatory Visit: Payer: No Typology Code available for payment source | Attending: Nurse Practitioner | Admitting: Nurse Practitioner

## 2021-03-24 ENCOUNTER — Encounter: Payer: Self-pay | Admitting: Nurse Practitioner

## 2021-03-24 ENCOUNTER — Other Ambulatory Visit: Payer: Self-pay

## 2021-03-24 VITALS — BP 121/83 | HR 65 | Temp 98.4°F | Resp 20 | Ht 72.0 in | Wt 385.0 lb

## 2021-03-24 DIAGNOSIS — E785 Hyperlipidemia, unspecified: Secondary | ICD-10-CM | POA: Diagnosis not present

## 2021-03-24 DIAGNOSIS — E039 Hypothyroidism, unspecified: Secondary | ICD-10-CM | POA: Diagnosis not present

## 2021-03-24 DIAGNOSIS — E1165 Type 2 diabetes mellitus with hyperglycemia: Secondary | ICD-10-CM | POA: Diagnosis not present

## 2021-03-24 DIAGNOSIS — I10 Essential (primary) hypertension: Secondary | ICD-10-CM

## 2021-03-24 LAB — GLUCOSE, POCT (MANUAL RESULT ENTRY): POC Glucose: 183 mg/dl — AB (ref 70–99)

## 2021-03-24 NOTE — Progress Notes (Signed)
Assessment & Plan:  Austin Beasley was seen today for diabetes.  Diagnoses and all orders for this visit:  Type 2 diabetes mellitus with hyperglycemia, without long-term current use of insulin (HCC) -     Glucose (CBG) -     CMP14+EGFR -     Hemoglobin A1c -     Ambulatory referral to Ophthalmology  Hypothyroidism, unspecified type -     Thyroid Panel With TSH  Primary hypertension Continue all antihypertensives as prescribed.  Remember to bring in your blood pressure log with you for your follow up appointment.  DASH/Mediterranean Diets are healthier choices for HTN.    Dyslipidemia, goal LDL below 70 INSTRUCTIONS: Work on a low fat, heart healthy diet and participate in regular aerobic exercise program by working out at least 150 minutes per week; 5 days a week-30 minutes per day. Avoid red meat/beef/steak,  fried foods. junk foods, sodas, sugary drinks, unhealthy snacking, alcohol and smoking.  Drink at least 80 oz of water per day and monitor your carbohydrate intake daily.      Patient has been counseled on age-appropriate routine health concerns for screening and prevention. These are reviewed and up-to-date. Referrals have been placed accordingly. Immunizations are up-to-date or declined.    Subjective:   Chief Complaint  Patient presents with   Diabetes   HPI AIMAN Beasley 41 y.o. male presents to office today for follow up to HTN/HPL/DM He has a past medical history of Diabetes mellitus without complication ([place]), High cholesterol, Hypertension, Obesity, Palpitations, and Thyroid disease.   He is currently being followed by podiatry and ophthalmology.   Hypertension He is not exercising and is adherent to low salt diet.  He does not have a blood pressure log  today.  Blood pressure is well controlled at home.  Cardiac symptoms none. Patient denies chest pain, dyspnea, irregular heart beat, lower extremity edema, and palpitations.  Cardiovascular risk factors:  diabetes mellitus, dyslipidemia, hypertension, male gender, obesity (BMI >= 30 kg/m2), and smoking/ tobacco exposure. Use of agents associated with hypertension: thyroid hormones.  History of target organ damage: none. BP Readings from Last 3 Encounters:  03/24/21 121/83  12/21/20 119/75  11/12/20 120/72    Hyperlipidemia Patient presents for follow up to hyperlipidemia.  He is medication compliant. He is not consistently diet compliant and denies statin intolerance including myalgias.  Lab Results  Component Value Date   CHOL 149 09/18/2020   Lab Results  Component Value Date   HDL 47 09/18/2020   Lab Results  Component Value Date   LDLCALC 81 09/18/2020   Lab Results  Component Value Date   TRIG 119 09/18/2020   Lab Results  Component Value Date   CHOLHDL 3.2 09/18/2020      Diabetes Mellitus Type II Diabetes is well controlled. Blood glucose log readings are as follows:  7 day 120 14 day 123 21 day 121 Current symptoms/problems include hyperglycemia and paresthesia of the feet and have been stable.  Known diabetic complications: peripheral neuropathy Current diabetic medications include: Metformin 850 mg daily. He takes gabapentin 300 mg 3 times daily for peripheral neuropathy.  States Eye exam current (within one year): no Weight trend: stable Prior visit with dietician: yes - Current monitoring regimen: home blood tests - 1-2 times daily Is He on ACE inhibitor or angiotensin II receptor blocker?  No  Lab Results  Component Value Date   HGBA1C 6.4 (H) 12/21/2020   HGBA1C 6.6 (H) 09/18/2020   HGBA1C 5.9 (A)  04/02/2020    Hypothyroidism Thyroid levels are stable with Synthroid 170 mcg daily.  He denies any symptoms of hypo or hyperthyroidism. Lab Results  Component Value Date   TSH 0.820 09/18/2020   T4TOTAL 9.0 09/18/2020        Review of Systems  Constitutional:  Negative for fever, malaise/fatigue and weight loss.  HENT: Negative.  Negative for  nosebleeds.   Eyes: Negative.  Negative for blurred vision, double vision and photophobia.  Respiratory: Negative.  Negative for cough and shortness of breath.   Cardiovascular: Negative.  Negative for chest pain, palpitations and leg swelling.  Gastrointestinal: Negative.  Negative for heartburn, nausea and vomiting.  Musculoskeletal: Negative.  Negative for myalgias.  Neurological: Negative.  Negative for dizziness, focal weakness, seizures and headaches.  Psychiatric/Behavioral: Negative.  Negative for suicidal ideas.    Past Medical History:  Diagnosis Date   Diabetes mellitus without complication (HCC)    High cholesterol    Hypertension    Obesity    Palpitations    Thyroid disease     Past Surgical History:  Procedure Laterality Date   ELEVATION OF DEPRESSED SKULL FRACTURE     age 30    Family History  Problem Relation Age of Onset   Diabetes Mother    Cancer Mother    Hypertension Father    Cancer Sister    Birth defects Maternal Grandmother    Birth defects Maternal Grandfather    Birth defects Paternal Grandmother    Birth defects Paternal Grandfather     Social History Reviewed with no changes to be made today.   Outpatient Medications Prior to Visit  Medication Sig Dispense Refill   atenolol (TENORMIN) 50 MG tablet Take 1 tablet (50 mg total) by mouth daily. 90 tablet 1   Blood Glucose Monitoring Suppl (FREESTYLE LITE) DEVI Use as instructed. Check blood glucose level by fingerstick twice per day.  E11.65 1 each 0   chlorhexidine (PERIDEX) 0.12 % solution Swish and spit with 1/2 ounce 2 times daily for 2 full minutes 473 mL 0   flurbiprofen (ANSAID) 100 MG tablet Take 1 tablet (100 mg total) by mouth 2 (two) times daily until complete 14 tablet 0   gabapentin (NEURONTIN) 300 MG capsule Take 1 capsule (300 mg total) by mouth 3 (three) times daily. 270 capsule 1   glucose blood test strip CHECK BLOOD GLUCOSE TWICE PER DAY AS INSTRUCTED 100 strip 2   ibuprofen  (ADVIL) 800 MG tablet Take 1 tablet (800 mg total) by mouth every 6-8 hours as needed. 18 tablet 0   levothyroxine (SYNTHROID) 175 MCG tablet Take 1 tablet (175 mcg total) by mouth daily before breakfast 90 tablet 2   metFORMIN (GLUCOPHAGE) 850 MG tablet Take 1 tablet (850 mg total) by mouth daily with breakfast. 90 tablet 1   methocarbamol (ROBAXIN) 500 MG tablet Take 1 tablet (500 mg total) by mouth 2 (two) times daily. 20 tablet 0   mupirocin ointment (BACTROBAN) 2 % APPLY TOPICALLY TO AFFECTED AREA TWICE DAILY FOR 1 WEEK AS NEEDED FOR BOILS. 22 g 2   omeprazole (PRILOSEC) 20 MG capsule Take 1 capsule (20 mg total) by mouth daily. 90 capsule 0   senna-docusate (SENOKOT-S) 8.6-50 MG tablet Take 2 tablets by mouth 2 (two) times daily as needed for mild constipation or moderate constipation. 60 tablet 1   simvastatin (ZOCOR) 10 MG tablet Take 1 tablet (10 mg total) by mouth at bedtime. 90 tablet 3   traMADol (ULTRAM)  50 MG tablet Take 1 tablet (50 mg total) by mouth every 6 (six) hours. 10 tablet 0   HYDROcodone-acetaminophen (NORCO/VICODIN) 5-325 MG tablet Take 1 tablet by mouth every 6 (six) hours as needed for pain. 14 tablet 0   HYDROcodone-acetaminophen (NORCO/VICODIN) 5-325 MG tablet Take 1 tablet by mouth every 4 to 6 hours as needed for pain 14 tablet 0   Nicotine 21-14-7 MG/24HR KIT 24m patch for 4 weeks; 14 mg patch for 2 weeks; 7 mg patch for 2 weeks. Place one patch on skin for 24 hours. 1 kit 0   penicillin v potassium (VEETID) 500 MG tablet Take 1 tablet (500 mg total) by mouth 4 (four) times daily until complete. 28 tablet 0   penicillin v potassium (VEETID) 500 MG tablet Take 1 tablet (500 mg total) by mouth 4 (four) times daily unitl complete 28 tablet 0   amoxicillin (AMOXIL) 500 MG capsule Take 1 capsule (500 mg total) by mouth 3 (three) times daily. (Patient not taking: Reported on 03/24/2021) 18 capsule 0   No facility-administered medications prior to visit.    No Known  Allergies     Objective:    BP 121/83 (BP Location: Right Arm, Patient Position: Sitting, Cuff Size: Large)    Pulse 65    Temp 98.4 F (36.9 C) (Oral)    Resp 20    Ht 6' (1.829 m)    Wt (!) 385 lb (174.6 kg)    SpO2 96%    BMI 52.22 kg/m  Wt Readings from Last 3 Encounters:  03/24/21 (!) 385 lb (174.6 kg)  12/21/20 (!) 382 lb 6 oz (173.4 kg)  11/12/20 (!) 372 lb 3.2 oz (168.8 kg)    Physical Exam Vitals and nursing note reviewed.  Constitutional:      Appearance: He is well-developed.  HENT:     Head: Normocephalic and atraumatic.  Cardiovascular:     Rate and Rhythm: Normal rate and regular rhythm.     Heart sounds: Normal heart sounds. No murmur heard.   No friction rub. No gallop.  Pulmonary:     Effort: Pulmonary effort is normal. No tachypnea or respiratory distress.     Breath sounds: Normal breath sounds. No decreased breath sounds, wheezing, rhonchi or rales.  Chest:     Chest wall: No tenderness.  Abdominal:     General: Bowel sounds are normal.     Palpations: Abdomen is soft.  Musculoskeletal:        General: Normal range of motion.     Cervical back: Normal range of motion.  Skin:    General: Skin is warm and dry.  Neurological:     Mental Status: He is alert and oriented to person, place, and time.     Coordination: Coordination normal.  Psychiatric:        Behavior: Behavior normal. Behavior is cooperative.        Thought Content: Thought content normal.        Judgment: Judgment normal.         Patient has been counseled extensively about nutrition and exercise as well as the importance of adherence with medications and regular follow-up. The patient was given clear instructions to go to ER or return to medical center if symptoms don't improve, worsen or new problems develop. The patient verbalized understanding.   Follow-up: Return in about 3 months (around 06/22/2021).   ZGildardo Pounds FNP-BC [facility] [place]  03/24/2021, 5:34 PM

## 2021-03-24 NOTE — Progress Notes (Signed)
Medication RF Discuss FMLA

## 2021-03-25 LAB — CMP14+EGFR
ALT: 30 IU/L (ref 0–44)
AST: 27 IU/L (ref 0–40)
Albumin/Globulin Ratio: 1.4 (ref 1.2–2.2)
Albumin: 4.1 g/dL (ref 4.0–5.0)
Alkaline Phosphatase: 57 IU/L (ref 44–121)
BUN/Creatinine Ratio: 9 (ref 9–20)
BUN: 10 mg/dL (ref 6–24)
Bilirubin Total: 0.3 mg/dL (ref 0.0–1.2)
CO2: 23 mmol/L (ref 20–29)
Calcium: 9.2 mg/dL (ref 8.7–10.2)
Chloride: 103 mmol/L (ref 96–106)
Creatinine, Ser: 1.09 mg/dL (ref 0.76–1.27)
Globulin, Total: 3 g/dL (ref 1.5–4.5)
Glucose: 159 mg/dL — ABNORMAL HIGH (ref 70–99)
Potassium: 4.4 mmol/L (ref 3.5–5.2)
Sodium: 140 mmol/L (ref 134–144)
Total Protein: 7.1 g/dL (ref 6.0–8.5)
eGFR: 87 mL/min/{1.73_m2} (ref >59)

## 2021-03-25 LAB — THYROID PANEL WITH TSH
Free Thyroxine Index: 3 (ref 1.2–4.9)
T3 Uptake Ratio: 32 % (ref 24–39)
T4, Total: 9.5 ug/dL (ref 4.5–12.0)
TSH: 0.432 u[IU]/mL — ABNORMAL LOW (ref 0.450–4.500)

## 2021-03-25 LAB — HEMOGLOBIN A1C
Est. average glucose Bld gHb Est-mCnc: 140 mg/dL
Hgb A1c MFr Bld: 6.5 % — ABNORMAL HIGH (ref 4.8–5.6)

## 2021-03-26 ENCOUNTER — Other Ambulatory Visit (HOSPITAL_COMMUNITY): Payer: Self-pay

## 2021-03-26 MED ORDER — TRAMADOL HCL 50 MG PO TABS
50.0000 mg | ORAL_TABLET | Freq: Four times a day (QID) | ORAL | 0 refills | Status: DC
  Filled 2021-03-26: qty 10, 3d supply, fill #0

## 2021-03-26 MED ORDER — AMOXICILLIN 500 MG PO CAPS
500.0000 mg | ORAL_CAPSULE | Freq: Three times a day (TID) | ORAL | 0 refills | Status: DC
  Filled 2021-03-26: qty 18, 6d supply, fill #0

## 2021-03-26 MED ORDER — IBUPROFEN 800 MG PO TABS
800.0000 mg | ORAL_TABLET | ORAL | 0 refills | Status: DC
  Filled 2021-03-26: qty 18, 6d supply, fill #0

## 2021-04-05 ENCOUNTER — Other Ambulatory Visit (HOSPITAL_COMMUNITY): Payer: Self-pay

## 2021-04-05 MED ORDER — PENICILLIN V POTASSIUM 500 MG PO TABS
500.0000 mg | ORAL_TABLET | Freq: Four times a day (QID) | ORAL | 0 refills | Status: DC
  Filled 2021-04-05: qty 28, 7d supply, fill #0

## 2021-04-09 ENCOUNTER — Other Ambulatory Visit (HOSPITAL_COMMUNITY): Payer: Self-pay

## 2021-04-09 MED ORDER — HYDROCODONE-ACETAMINOPHEN 5-325 MG PO TABS
1.0000 | ORAL_TABLET | ORAL | 0 refills | Status: DC | PRN
Start: 2021-04-09 — End: 2021-09-01
  Filled 2021-04-09: qty 14, 3d supply, fill #0

## 2021-04-14 ENCOUNTER — Other Ambulatory Visit (HOSPITAL_COMMUNITY): Payer: Self-pay

## 2021-04-22 ENCOUNTER — Other Ambulatory Visit (HOSPITAL_COMMUNITY): Payer: Self-pay

## 2021-04-22 ENCOUNTER — Telehealth: Payer: Self-pay | Admitting: Nurse Practitioner

## 2021-04-22 ENCOUNTER — Other Ambulatory Visit: Payer: Self-pay | Admitting: Nurse Practitioner

## 2021-04-22 DIAGNOSIS — K2100 Gastro-esophageal reflux disease with esophagitis, without bleeding: Secondary | ICD-10-CM

## 2021-04-22 NOTE — Telephone Encounter (Signed)
Copied from CRM [id number]. Topic: General - Other >> Apr 22, 2021  8:30 AM Traci Sermon wrote: Reason for CRM: Pt called in stating [employer] has not received his FMLA forms and requested if they could be re faxed, please advise.

## 2021-04-23 ENCOUNTER — Other Ambulatory Visit (HOSPITAL_COMMUNITY): Payer: Self-pay

## 2021-04-23 MED ORDER — OMEPRAZOLE 20 MG PO CPDR
20.0000 mg | DELAYED_RELEASE_CAPSULE | Freq: Every day | ORAL | 2 refills | Status: DC
  Filled 2021-04-23: qty 90, 90d supply, fill #0
  Filled 2021-07-07: qty 90, 90d supply, fill #1

## 2021-04-23 NOTE — Telephone Encounter (Signed)
Requested Prescriptions  Pending Prescriptions Disp Refills   omeprazole (PRILOSEC) 20 MG capsule 90 capsule 1    Sig: Take 1 capsule (20 mg total) by mouth daily.     Gastroenterology: Proton Pump Inhibitors Passed - 04/22/2021  1:05 PM      Passed - Valid encounter within last 12 months    Recent Outpatient Visits          1 month ago Type 2 diabetes mellitus with hyperglycemia, without long-term current use of insulin [facility])   [facility] [place], [place] W, NP   4 months ago Type 2 diabetes mellitus with hyperglycemia, without long-term current use of insulin [facility])   [facility] [facility] [place], [place] W, NP   7 months ago Type 2 diabetes mellitus with hyperglycemia, without long-term current use of insulin [facility])   [place] [facility] [place], [place] W, NP   1 year ago Type 2 diabetes mellitus with hyperglycemia, without long-term current use of insulin [facility])   [facility] [place], [place], [place]   1 year ago Type 2 diabetes mellitus with hyperglycemia, without long-term current use of insulin [facility])    [facility] [place], [place] Stakes, NP      Future Appointments            In 2 months Claiborne Rigg, NP [facility]

## 2021-04-26 NOTE — Telephone Encounter (Signed)
Will notify when paper has been faxed.

## 2021-04-28 ENCOUNTER — Other Ambulatory Visit (HOSPITAL_COMMUNITY): Payer: Self-pay

## 2021-05-13 ENCOUNTER — Other Ambulatory Visit (HOSPITAL_COMMUNITY): Payer: Self-pay

## 2021-06-01 ENCOUNTER — Encounter: Payer: Self-pay | Admitting: Podiatry

## 2021-06-01 ENCOUNTER — Ambulatory Visit (INDEPENDENT_AMBULATORY_CARE_PROVIDER_SITE_OTHER): Payer: No Typology Code available for payment source | Admitting: Podiatry

## 2021-06-01 ENCOUNTER — Other Ambulatory Visit: Payer: Self-pay

## 2021-06-01 DIAGNOSIS — E119 Type 2 diabetes mellitus without complications: Secondary | ICD-10-CM | POA: Diagnosis not present

## 2021-06-01 DIAGNOSIS — M7661 Achilles tendinitis, right leg: Secondary | ICD-10-CM

## 2021-06-01 DIAGNOSIS — M79674 Pain in right toe(s): Secondary | ICD-10-CM

## 2021-06-01 DIAGNOSIS — B351 Tinea unguium: Secondary | ICD-10-CM | POA: Diagnosis not present

## 2021-06-01 DIAGNOSIS — M79675 Pain in left toe(s): Secondary | ICD-10-CM

## 2021-06-01 NOTE — Progress Notes (Signed)
This patient returns to my office for at risk foot care.  This patient requires this care by a professional since this patient will be at risk due to having diabetes.  Patient hs pain in his achilles tendon right foot after a day of work.  This patient is unable to cut nails himself since the patient cannot reach his nails.These nails are painful walking and wearing shoes.  This patient presents for at risk foot care today.  General Appearance  Alert, conversant and in no acute stress.  Vascular  Dorsalis pedis and posterior tibial  pulses are palpable  bilaterally.  Capillary return is within normal limits  bilaterally. Temperature is within normal limits  bilaterally.  Neurologic  Senn-Weinstein monofilament wire test within normal limits  bilaterally. Muscle power within normal limits bilaterally.  Nails Thick disfigured discolored nails with subungual debris  from hallux to fifth toes bilaterally. No evidence of bacterial infection or drainage bilaterally.  Orthopedic  No limitations of motion  feet .  No crepitus or effusions noted.  No bony pathology or digital deformities noted.  Skin  normotropic skin with no porokeratosis noted bilaterally.  No signs of infections or ulcers noted.     Onychomycosis  Pain in right toes  Pain in left toes  Consent was obtained for treatment procedures.   Mechanical debridement of nails 1-5  bilaterally performed with a nail nipper.  Filed with dremel without incident.  Dispensed powerstep insoles and if the problem persists to make an appointment with medical podiatrists.   Return office visit   4 months                  Told patient to return for periodic foot care and evaluation due to potential at risk complications.   Zander Ingham DPM   

## 2021-06-11 ENCOUNTER — Other Ambulatory Visit (HOSPITAL_COMMUNITY): Payer: Self-pay

## 2021-06-25 ENCOUNTER — Ambulatory Visit: Payer: No Typology Code available for payment source | Admitting: Nurse Practitioner

## 2021-07-07 ENCOUNTER — Other Ambulatory Visit (HOSPITAL_COMMUNITY): Payer: Self-pay

## 2021-07-07 ENCOUNTER — Other Ambulatory Visit: Payer: Self-pay | Admitting: Nurse Practitioner

## 2021-07-07 DIAGNOSIS — E038 Other specified hypothyroidism: Secondary | ICD-10-CM

## 2021-07-07 DIAGNOSIS — R002 Palpitations: Secondary | ICD-10-CM

## 2021-07-07 DIAGNOSIS — E1165 Type 2 diabetes mellitus with hyperglycemia: Secondary | ICD-10-CM

## 2021-07-07 MED ORDER — METFORMIN HCL 850 MG PO TABS
850.0000 mg | ORAL_TABLET | Freq: Every day | ORAL | 0 refills | Status: DC
  Filled 2021-07-07: qty 30, 30d supply, fill #0

## 2021-07-07 MED ORDER — ATENOLOL 50 MG PO TABS
50.0000 mg | ORAL_TABLET | Freq: Every day | ORAL | 0 refills | Status: DC
  Filled 2021-07-07: qty 30, 30d supply, fill #0

## 2021-07-07 MED ORDER — LEVOTHYROXINE SODIUM 175 MCG PO TABS
175.0000 ug | ORAL_TABLET | Freq: Every day | ORAL | 0 refills | Status: DC
  Filled 2021-07-07: qty 30, 30d supply, fill #0

## 2021-07-08 ENCOUNTER — Other Ambulatory Visit (HOSPITAL_COMMUNITY): Payer: Self-pay

## 2021-07-23 ENCOUNTER — Other Ambulatory Visit (HOSPITAL_COMMUNITY): Payer: Self-pay

## 2021-08-06 ENCOUNTER — Other Ambulatory Visit: Payer: Self-pay | Admitting: Nurse Practitioner

## 2021-08-06 DIAGNOSIS — E1165 Type 2 diabetes mellitus with hyperglycemia: Secondary | ICD-10-CM

## 2021-08-06 DIAGNOSIS — E038 Other specified hypothyroidism: Secondary | ICD-10-CM

## 2021-08-06 DIAGNOSIS — R002 Palpitations: Secondary | ICD-10-CM

## 2021-08-06 MED ORDER — LEVOTHYROXINE SODIUM 175 MCG PO TABS
175.0000 ug | ORAL_TABLET | Freq: Every day | ORAL | 0 refills | Status: DC
  Filled 2021-08-06: qty 30, 30d supply, fill #0

## 2021-08-06 MED ORDER — ATENOLOL 50 MG PO TABS
50.0000 mg | ORAL_TABLET | Freq: Every day | ORAL | 0 refills | Status: DC
  Filled 2021-08-06: qty 30, 30d supply, fill #0

## 2021-08-06 MED ORDER — METFORMIN HCL 850 MG PO TABS
850.0000 mg | ORAL_TABLET | Freq: Every day | ORAL | 0 refills | Status: DC
  Filled 2021-08-06: qty 30, 30d supply, fill #0

## 2021-08-06 NOTE — Telephone Encounter (Signed)
Pt has appt scheduled for 09/01/21 Requested Prescriptions  Pending Prescriptions Disp Refills  . metFORMIN (GLUCOPHAGE) 850 MG tablet 30 tablet 0    Sig: Take 1 tablet (850 mg total) by mouth daily with breakfast.     Endocrinology:  Diabetes - Biguanides Failed - 08/06/2021  4:26 PM      Failed - B12 Level in normal range and within 720 days    No results found for: VITAMINB12       Failed - CBC within normal limits and completed in the last 12 months    WBC  Date Value Ref Range Status  09/18/2020 7.7 3.4 - 10.8 x10E3/uL Final  04/24/2017 8.5 4.0 - 10.5 K/uL Final   RBC  Date Value Ref Range Status  09/18/2020 4.46 4.14 - 5.80 x10E6/uL Final  04/24/2017 4.31 4.22 - 5.81 MIL/uL Final   Hemoglobin  Date Value Ref Range Status  09/18/2020 14.1 13.0 - 17.7 g/dL Final   Hematocrit  Date Value Ref Range Status  09/18/2020 41.4 37.5 - 51.0 % Final   MCHC  Date Value Ref Range Status  09/18/2020 34.1 31.5 - 35.7 g/dL Final  04/24/2017 35.0 30.0 - 36.0 g/dL Final   [facility]  Date Value Ref Range Status  09/18/2020 31.6 26.6 - 33.0 pg Final  04/24/2017 33.4 26.0 - 34.0 pg Final   MCV  Date Value Ref Range Status  09/18/2020 93 79 - 97 fL Final   No results found for: PLTCOUNTKUC, LABPLAT, POCPLA RDW  Date Value Ref Range Status  09/18/2020 12.3 11.6 - 15.4 % Final         Passed - Cr in normal range and within 360 days    Creatinine, Ser  Date Value Ref Range Status  03/24/2021 1.09 0.76 - 1.27 mg/dL Final         Passed - HBA1C is between 0 and 7.9 and within 180 days    HbA1c, POC (prediabetic range)  Date Value Ref Range Status  08/16/2019 6.2 5.7 - 6.4 % Final   Hgb A1c MFr Bld  Date Value Ref Range Status  03/24/2021 6.5 (H) 4.8 - 5.6 % Final    Comment:             Prediabetes: 5.7 - 6.4          Diabetes: >6.4          Glycemic control for adults with diabetes: <7.0          Passed - eGFR in normal range and within 360 days    GFR calc Af Amer  Date  Value Ref Range Status  01/01/2020 99 >59 mL/min/1.73 Final    Comment:    **In accordance with recommendations from the NKF-ASN Task force,**   [facility] is in the process of updating its eGFR calculation to the   2021 CKD-EPI creatinine equation that estimates kidney function   without a race variable.    GFR calc non Af Amer  Date Value Ref Range Status  01/01/2020 85 >59 mL/min/1.73 Final   eGFR  Date Value Ref Range Status  03/24/2021 87 >59 mL/min/1.73 Final         Passed - Valid encounter within last 6 months    Recent Outpatient Visits          4 months ago Type 2 diabetes mellitus with hyperglycemia, without long-term current use of insulin [facility])   [place], Vernia Buff, NP   7  months ago Type 2 diabetes mellitus with hyperglycemia, without long-term current use of insulin [facility])   [place], [place] W, NP   10 months ago Type 2 diabetes mellitus with hyperglycemia, without long-term current use of insulin [facility])   [place], [place] W, NP   1 year ago Type 2 diabetes mellitus with hyperglycemia, without long-term current use of insulin [facility])   [place], [place], [place]   1 year ago Type 2 diabetes mellitus with hyperglycemia, without long-term current use of insulin [facility])   [place], Zelda W, NP      Future Appointments            In 3 weeks Gildardo Pounds, NP [place]           . levothyroxine (SYNTHROID) 175 MCG tablet 30 tablet 0    Sig: Take 1 tablet (175 mcg total) by mouth daily before breakfast.     Endocrinology:  Hypothyroid Agents Failed - 08/06/2021  4:26 PM      Failed - TSH in normal range and within 360 days    TSH  Date Value Ref Range Status  03/24/2021 0.432 (L) 0.450 - 4.500 uIU/mL Final         Passed - Valid  encounter within last 12 months    Recent Outpatient Visits          4 months ago Type 2 diabetes mellitus with hyperglycemia, without long-term current use of insulin [facility])   [place], [place] W, NP   7 months ago Type 2 diabetes mellitus with hyperglycemia, without long-term current use of insulin [facility])   [place], [place] W, NP   10 months ago Type 2 diabetes mellitus with hyperglycemia, without long-term current use of insulin [facility])   [place], [place] W, NP   1 year ago Type 2 diabetes mellitus with hyperglycemia, without long-term current use of insulin [facility])   [place], [place], [place]   1 year ago Type 2 diabetes mellitus with hyperglycemia, without long-term current use of insulin [facility])   [place], Vernia Buff, NP      Future Appointments            In 3 weeks Gildardo Pounds, NP [place]           . atenolol (TENORMIN) 50 MG tablet 30 tablet 0    Sig: Take 1 tablet (50 mg total) by mouth daily.     Cardiovascular: Beta Blockers 2 Passed - 08/06/2021  4:26 PM      Passed - Cr in normal range and within 360 days    Creatinine, Ser  Date Value Ref Range Status  03/24/2021 1.09 0.76 - 1.27 mg/dL Final         Passed - Last BP in normal range    BP Readings from Last 1 Encounters:  03/24/21 121/83         Passed - Last Heart Rate in normal range    Pulse Readings from Last 1 Encounters:  03/24/21 65         Passed - Valid encounter within last 6 months    Recent Outpatient Visits  4 months ago Type 2 diabetes mellitus with hyperglycemia, without long-term current use of insulin [facility])   [place], [place] W, NP   7 months ago Type 2 diabetes mellitus with hyperglycemia, without long-term current  use of insulin [facility])   [place], [place] W, NP   10 months ago Type 2 diabetes mellitus with hyperglycemia, without long-term current use of insulin [facility])   [place], [place] W, NP   1 year ago Type 2 diabetes mellitus with hyperglycemia, without long-term current use of insulin [facility])   [place], [place], [place]   1 year ago Type 2 diabetes mellitus with hyperglycemia, without long-term current use of insulin [facility])   [place], Zel[place] W, NP      Future Appointments            In 3 weeks Gildardo Pounds, NP [place]

## 2021-08-06 NOTE — Telephone Encounter (Signed)
Medication Refill - Medication: metFORMIN (GLUCOPHAGE) 850 MG tablet,levothyroxine (SYNTHROID) 175 MCG tablet,atenolol (TENORMIN) 50 MG tablet  Has the patient contacted their pharmacy? No. No, more refills.   (Agent: If no, request that the patient contact the pharmacy for the refill. If patient does not wish to contact the pharmacy document the reason why and proceed with request.)   Preferred Pharmacy (with phone number or street name):  [facility]  [address] [place] [address]  Phone: [phone] Fax: [phone]  Hours: M-F 7:30am-6pm   Has the patient been seen for an appointment in the last year OR does the patient have an upcoming appointment? Yes.    Agent: Please be advised that RX refills may take up to 3 business days. We ask that you follow-up with your pharmacy.

## 2021-08-09 ENCOUNTER — Other Ambulatory Visit (HOSPITAL_COMMUNITY): Payer: Self-pay

## 2021-08-12 ENCOUNTER — Other Ambulatory Visit (HOSPITAL_COMMUNITY): Payer: Self-pay

## 2021-09-01 ENCOUNTER — Ambulatory Visit
Admission: RE | Admit: 2021-09-01 | Discharge: 2021-09-01 | Disposition: A | Discharge status: Home / Self Care | Payer: No Typology Code available for payment source | Source: Ambulatory Visit | Attending: Nurse Practitioner | Admitting: Nurse Practitioner

## 2021-09-01 ENCOUNTER — Ambulatory Visit: Payer: No Typology Code available for payment source | Attending: Nurse Practitioner | Admitting: Nurse Practitioner

## 2021-09-01 ENCOUNTER — Encounter: Payer: Self-pay | Admitting: Nurse Practitioner

## 2021-09-01 ENCOUNTER — Other Ambulatory Visit: Payer: Self-pay | Admitting: Nurse Practitioner

## 2021-09-01 ENCOUNTER — Other Ambulatory Visit (HOSPITAL_COMMUNITY): Payer: Self-pay

## 2021-09-01 VITALS — BP 109/74 | HR 54 | Temp 98.0°F | Resp 16 | Wt 387.0 lb

## 2021-09-01 DIAGNOSIS — D72829 Elevated white blood cell count, unspecified: Secondary | ICD-10-CM

## 2021-09-01 DIAGNOSIS — E785 Hyperlipidemia, unspecified: Secondary | ICD-10-CM

## 2021-09-01 DIAGNOSIS — M545 Low back pain, unspecified: Secondary | ICD-10-CM

## 2021-09-01 DIAGNOSIS — G8929 Other chronic pain: Secondary | ICD-10-CM

## 2021-09-01 DIAGNOSIS — K5903 Drug induced constipation: Secondary | ICD-10-CM

## 2021-09-01 DIAGNOSIS — E1165 Type 2 diabetes mellitus with hyperglycemia: Secondary | ICD-10-CM | POA: Diagnosis not present

## 2021-09-01 DIAGNOSIS — E038 Other specified hypothyroidism: Secondary | ICD-10-CM | POA: Diagnosis not present

## 2021-09-01 DIAGNOSIS — R002 Palpitations: Secondary | ICD-10-CM

## 2021-09-01 DIAGNOSIS — K2100 Gastro-esophageal reflux disease with esophagitis, without bleeding: Secondary | ICD-10-CM

## 2021-09-01 LAB — POCT GLYCOSYLATED HEMOGLOBIN (HGB A1C): HbA1c, POC (prediabetic range): 6.2 % (ref 5.7–6.4)

## 2021-09-01 LAB — GLUCOSE, POCT (MANUAL RESULT ENTRY): POC Glucose: 80 mg/dl (ref 70–99)

## 2021-09-01 MED ORDER — LEVOTHYROXINE SODIUM 175 MCG PO TABS
175.0000 ug | ORAL_TABLET | Freq: Every day | ORAL | 1 refills | Status: DC
  Filled 2021-09-01 – 2021-09-10 (×2): qty 30, 30d supply, fill #0
  Filled 2021-10-12: qty 30, 30d supply, fill #1

## 2021-09-01 MED ORDER — CYCLOBENZAPRINE HCL 10 MG PO TABS
10.0000 mg | ORAL_TABLET | Freq: Three times a day (TID) | ORAL | 1 refills | Status: DC | PRN
  Filled 2021-09-01 – 2021-09-10 (×2): qty 60, 20d supply, fill #0

## 2021-09-01 MED ORDER — GABAPENTIN 300 MG PO CAPS
300.0000 mg | ORAL_CAPSULE | Freq: Three times a day (TID) | ORAL | 1 refills | Status: DC
  Filled 2021-09-01 – 2022-01-25 (×2): qty 270, 90d supply, fill #0

## 2021-09-01 MED ORDER — FENOFIBRATE 48 MG PO TABS
48.0000 mg | ORAL_TABLET | Freq: Every day | ORAL | 1 refills | Status: DC
  Filled 2021-09-01: qty 90, 90d supply, fill #0
  Filled 2021-09-10: qty 30, 30d supply, fill #0
  Filled 2021-10-12: qty 30, 30d supply, fill #1
  Filled 2021-11-01: qty 90, 90d supply, fill #2
  Filled 2021-11-23: qty 30, 30d supply, fill #2
  Filled 2021-12-29: qty 30, 30d supply, fill #3
  Filled 2022-02-17: qty 30, 30d supply, fill #4
  Filled 2022-03-24: qty 30, 30d supply, fill #5

## 2021-09-01 MED ORDER — SENNOSIDES-DOCUSATE SODIUM 8.6-50 MG PO TABS
2.0000 | ORAL_TABLET | Freq: Two times a day (BID) | ORAL | 1 refills | Status: DC | PRN
  Filled 2021-09-01: qty 60, 15d supply, fill #0

## 2021-09-01 MED ORDER — METFORMIN HCL 850 MG PO TABS
850.0000 mg | ORAL_TABLET | Freq: Every day | ORAL | 1 refills | Status: DC
  Filled 2021-09-01: qty 90, 90d supply, fill #0
  Filled 2021-09-10: qty 30, 30d supply, fill #0
  Filled 2021-10-12: qty 30, 30d supply, fill #1
  Filled 2021-11-01: qty 90, 90d supply, fill #2
  Filled 2021-11-12: qty 30, 30d supply, fill #2

## 2021-09-01 MED ORDER — OMEGA-3-ACID ETHYL ESTERS 1 G PO CAPS
1.0000 g | ORAL_CAPSULE | Freq: Every day | ORAL | 1 refills | Status: DC
  Filled 2021-09-01 – 2022-04-13 (×2): qty 90, 90d supply, fill #0
  Filled 2022-07-15: qty 90, 90d supply, fill #1

## 2021-09-01 MED ORDER — OMEPRAZOLE 20 MG PO CPDR
20.0000 mg | DELAYED_RELEASE_CAPSULE | Freq: Every day | ORAL | 2 refills | Status: DC
  Filled 2021-09-01 – 2021-11-01 (×2): qty 90, 90d supply, fill #0
  Filled 2022-04-14: qty 90, 90d supply, fill #1
  Filled 2022-06-05 – 2022-07-15 (×2): qty 90, 90d supply, fill #2

## 2021-09-01 MED ORDER — IBUPROFEN 800 MG PO TABS
800.0000 mg | ORAL_TABLET | Freq: Three times a day (TID) | ORAL | 1 refills | Status: DC | PRN
  Filled 2021-09-01 – 2021-09-10 (×2): qty 60, 20d supply, fill #0

## 2021-09-01 MED ORDER — ATENOLOL 50 MG PO TABS
50.0000 mg | ORAL_TABLET | Freq: Every day | ORAL | 1 refills | Status: DC
  Filled 2021-09-01 – 2021-09-10 (×2): qty 90, 90d supply, fill #0
  Filled 2021-11-01: qty 90, 90d supply, fill #1
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
## 2021-09-01 NOTE — Progress Notes (Signed)
Concerns with simvastatin Having joint pain x several mos.

## 2021-09-02 LAB — LIPID PANEL
Chol/HDL Ratio: 2.5 ratio (ref 0.0–5.0)
Cholesterol, Total: 123 mg/dL (ref 100–199)
HDL: 49 mg/dL (ref >39)
LDL Chol Calc (NIH): 56 mg/dL (ref 0–99)
Triglycerides: 95 mg/dL (ref 0–149)
VLDL Cholesterol Cal: 18 mg/dL (ref 5–40)

## 2021-09-02 LAB — THYROID PANEL WITH TSH
Free Thyroxine Index: 2.5 (ref 1.2–4.9)
T3 Uptake Ratio: 28 % (ref 24–39)
T4, Total: 8.8 ug/dL (ref 4.5–12.0)
TSH: 1.72 u[IU]/mL (ref 0.450–4.500)

## 2021-09-02 LAB — CBC WITH DIFFERENTIAL/PLATELET
Basophils Absolute: 0.1 10*3/uL (ref 0.0–0.2)
Basos: 1 %
EOS (ABSOLUTE): 0.3 10*3/uL (ref 0.0–0.4)
Eos: 4 %
Hematocrit: 39.9 % (ref 37.5–51.0)
Hemoglobin: 13.9 g/dL (ref 13.0–17.7)
Immature Grans (Abs): 0 10*3/uL (ref 0.0–0.1)
Immature Granulocytes: 0 %
Lymphocytes Absolute: 3.1 10*3/uL (ref 0.7–3.1)
Lymphs: 37 %
MCH: 31.6 pg (ref 26.6–33.0)
MCHC: 34.8 g/dL (ref 31.5–35.7)
MCV: 91 fL (ref 79–97)
Monocytes Absolute: 0.9 10*3/uL (ref 0.1–0.9)
Monocytes: 11 %
Neutrophils Absolute: 3.9 10*3/uL (ref 1.4–7.0)
Neutrophils: 47 %
Platelets: 319 10*3/uL (ref 150–450)
RBC: 4.4 x10E6/uL (ref 4.14–5.80)
RDW: 12.4 % (ref 11.6–15.4)
WBC: 8.3 10*3/uL (ref 3.4–10.8)

## 2021-09-02 LAB — CMP14+EGFR
ALT: 22 IU/L (ref 0–44)
AST: 15 IU/L (ref 0–40)
Albumin/Globulin Ratio: 1.4 (ref 1.2–2.2)
Albumin: 4.1 g/dL (ref 4.0–5.0)
Alkaline Phosphatase: 66 IU/L (ref 44–121)
BUN/Creatinine Ratio: 14 (ref 9–20)
BUN: 16 mg/dL (ref 6–24)
Bilirubin Total: 0.4 mg/dL (ref 0.0–1.2)
CO2: 25 mmol/L (ref 20–29)
Calcium: 9.6 mg/dL (ref 8.7–10.2)
Chloride: 102 mmol/L (ref 96–106)
Creatinine, Ser: 1.16 mg/dL (ref 0.76–1.27)
Globulin, Total: 3 g/dL (ref 1.5–4.5)
Glucose: 74 mg/dL (ref 70–99)
Potassium: 4.5 mmol/L (ref 3.5–5.2)
Sodium: 140 mmol/L (ref 134–144)
Total Protein: 7.1 g/dL (ref 6.0–8.5)
eGFR: 81 mL/min/{1.73_m2} (ref >59)

## 2021-09-03 ENCOUNTER — Encounter: Payer: Self-pay | Admitting: Nurse Practitioner

## 2021-09-10 ENCOUNTER — Other Ambulatory Visit (HOSPITAL_COMMUNITY): Payer: Self-pay

## 2021-09-24 ENCOUNTER — Ambulatory Visit: Payer: No Typology Code available for payment source | Admitting: Podiatry

## 2021-09-27 ENCOUNTER — Ambulatory Visit (INDEPENDENT_AMBULATORY_CARE_PROVIDER_SITE_OTHER): Payer: No Typology Code available for payment source | Admitting: Podiatry

## 2021-09-27 ENCOUNTER — Encounter: Payer: Self-pay | Admitting: Podiatry

## 2021-09-27 ENCOUNTER — Encounter: Payer: Self-pay | Admitting: Nurse Practitioner

## 2021-09-27 DIAGNOSIS — B351 Tinea unguium: Secondary | ICD-10-CM

## 2021-09-27 DIAGNOSIS — E119 Type 2 diabetes mellitus without complications: Secondary | ICD-10-CM

## 2021-09-27 DIAGNOSIS — M79675 Pain in left toe(s): Secondary | ICD-10-CM

## 2021-09-27 DIAGNOSIS — M79674 Pain in right toe(s): Secondary | ICD-10-CM | POA: Diagnosis not present

## 2021-09-27 NOTE — Progress Notes (Signed)
This patient returns to my office for at risk foot care.  This patient requires this care by a professional since this patient will be at risk due to having diabetes.  Patient hs pain in his achilles tendon right foot after a day of work.  This patient is unable to cut nails himself since the patient cannot reach his nails.These nails are painful walking and wearing shoes.  This patient presents for at risk foot care today.  General Appearance  Alert, conversant and in no acute stress.  Vascular  Dorsalis pedis and posterior tibial  pulses are palpable  bilaterally.  Capillary return is within normal limits  bilaterally. Temperature is within normal limits  bilaterally.  Neurologic  Senn-Weinstein monofilament wire test within normal limits  bilaterally. Muscle power within normal limits bilaterally.  Nails Thick disfigured discolored nails with subungual debris  from hallux to fifth toes bilaterally. No evidence of bacterial infection or drainage bilaterally.  Orthopedic  No limitations of motion  feet .  No crepitus or effusions noted.  No bony pathology or digital deformities noted.  Skin  normotropic skin with no porokeratosis noted bilaterally.  No signs of infections or ulcers noted.     Onychomycosis  Pain in right toes  Pain in left toes  Consent was obtained for treatment procedures.   Mechanical debridement of nails 1-5  bilaterally performed with a nail nipper.  Filed with dremel without incident.  Dispensed powerstep insoles and if the problem persists to make an appointment with medical podiatrists.   Return office visit   4 months                  Told patient to return for periodic foot care and evaluation due to potential at risk complications.   Helane Gunther DPM

## 2021-10-01 ENCOUNTER — Ambulatory Visit: Payer: No Typology Code available for payment source | Admitting: Podiatry

## 2021-10-12 ENCOUNTER — Other Ambulatory Visit (HOSPITAL_COMMUNITY): Payer: Self-pay

## 2021-11-01 ENCOUNTER — Other Ambulatory Visit: Payer: Self-pay | Admitting: Nurse Practitioner

## 2021-11-01 ENCOUNTER — Other Ambulatory Visit (HOSPITAL_COMMUNITY): Payer: Self-pay

## 2021-11-01 DIAGNOSIS — E038 Other specified hypothyroidism: Secondary | ICD-10-CM

## 2021-11-02 ENCOUNTER — Other Ambulatory Visit (HOSPITAL_COMMUNITY): Payer: Self-pay

## 2021-11-02 MED ORDER — LEVOTHYROXINE SODIUM 175 MCG PO TABS
175.0000 ug | ORAL_TABLET | Freq: Every day | ORAL | 1 refills | Status: DC
  Filled 2021-11-02: qty 30, 30d supply, fill #0

## 2021-11-02 NOTE — Telephone Encounter (Signed)
Requested Prescriptions  Pending Prescriptions Disp Refills  . levothyroxine (SYNTHROID) 175 MCG tablet 30 tablet 1    Sig: Take 1 tablet (175 mcg total) by mouth daily before breakfast.     Endocrinology:  Hypothyroid Agents Passed - 11/01/2021  3:20 PM      Passed - TSH in normal range and within 360 days    TSH  Date Value Ref Range Status  09/01/2021 1.720 0.450 - 4.500 uIU/mL Final         Passed - Valid encounter within last 12 months    Recent Outpatient Visits          2 months ago Type 2 diabetes mellitus with hyperglycemia, without long-term current use of insulin [facility])   [place] [facility] [place], [place] W, NP   7 months ago Type 2 diabetes mellitus with hyperglycemia, without long-term current use of insulin [facility])   [facility] [facility] [place], [place] W, NP   10 months ago Type 2 diabetes mellitus with hyperglycemia, without long-term current use of insulin [facility])   [place] [facility] [place], [place] W, NP   1 year ago Type 2 diabetes mellitus with hyperglycemia, without long-term current use of insulin [facility])    [facility] [place], [place] W, NP   1 year ago Type 2 diabetes mellitus with hyperglycemia, without long-term current use of insulin [facility])   [facility] [place], [place]ella Schlein, [place]      Future Appointments            In 1 month Claiborne Rigg, NP [employer] [facility]

## 2021-11-05 ENCOUNTER — Other Ambulatory Visit (HOSPITAL_COMMUNITY): Payer: Self-pay

## 2021-11-12 ENCOUNTER — Other Ambulatory Visit (HOSPITAL_COMMUNITY): Payer: Self-pay

## 2021-11-23 ENCOUNTER — Other Ambulatory Visit (HOSPITAL_COMMUNITY): Payer: Self-pay

## 2021-12-03 ENCOUNTER — Other Ambulatory Visit (HOSPITAL_COMMUNITY): Payer: Self-pay

## 2021-12-03 ENCOUNTER — Ambulatory Visit: Payer: Self-pay | Attending: Nurse Practitioner | Admitting: Nurse Practitioner

## 2021-12-03 ENCOUNTER — Encounter: Payer: Self-pay | Admitting: Nurse Practitioner

## 2021-12-03 VITALS — BP 120/73 | HR 75 | Temp 97.8°F | Ht 72.0 in | Wt 399.8 lb

## 2021-12-03 DIAGNOSIS — E039 Hypothyroidism, unspecified: Secondary | ICD-10-CM

## 2021-12-03 DIAGNOSIS — K2100 Gastro-esophageal reflux disease with esophagitis, without bleeding: Secondary | ICD-10-CM

## 2021-12-03 DIAGNOSIS — K5903 Drug induced constipation: Secondary | ICD-10-CM

## 2021-12-03 DIAGNOSIS — E1165 Type 2 diabetes mellitus with hyperglycemia: Secondary | ICD-10-CM

## 2021-12-03 DIAGNOSIS — K5909 Other constipation: Secondary | ICD-10-CM

## 2021-12-03 DIAGNOSIS — R002 Palpitations: Secondary | ICD-10-CM

## 2021-12-03 DIAGNOSIS — E785 Hyperlipidemia, unspecified: Secondary | ICD-10-CM

## 2021-12-03 DIAGNOSIS — E038 Other specified hypothyroidism: Secondary | ICD-10-CM

## 2021-12-03 LAB — POCT GLYCOSYLATED HEMOGLOBIN (HGB A1C): HbA1c, POC (controlled diabetic range): 6.5 % (ref 0.0–7.0)

## 2021-12-03 LAB — GLUCOSE, POCT (MANUAL RESULT ENTRY): POC Glucose: 125 mg/dl — AB (ref 70–99)

## 2021-12-03 MED ORDER — ATENOLOL 50 MG PO TABS
50.0000 mg | ORAL_TABLET | Freq: Every day | ORAL | 1 refills | Status: DC
  Filled 2021-12-03: qty 30, 30d supply, fill #0
  Filled 2021-12-29: qty 30, 30d supply, fill #1
  Filled 2022-01-25: qty 30, 30d supply, fill #2
  Filled 2022-03-06: qty 30, 30d supply, fill #3
  Filled 2022-04-13: qty 30, 30d supply, fill #4
  Filled 2022-05-10: qty 30, 30d supply, fill #5

## 2021-12-03 MED ORDER — LEVOTHYROXINE SODIUM 175 MCG PO TABS
175.0000 ug | ORAL_TABLET | Freq: Every day | ORAL | 1 refills | Status: DC
  Filled 2021-12-03: qty 30, 30d supply, fill #0
  Filled 2021-12-29: qty 30, 30d supply, fill #1
  Filled 2022-01-25: qty 30, 30d supply, fill #2
  Filled 2022-03-24: qty 30, 30d supply, fill #3
  Filled 2022-04-13: qty 30, 30d supply, fill #4
  Filled 2022-05-10: qty 30, 30d supply, fill #5

## 2021-12-03 MED ORDER — METFORMIN HCL 850 MG PO TABS
850.0000 mg | ORAL_TABLET | Freq: Every day | ORAL | 1 refills | Status: DC
  Filled 2021-12-03 – 2021-12-09 (×2): qty 90, 90d supply, fill #0
  Filled 2022-03-06: qty 90, 90d supply, fill #1

## 2021-12-03 MED ORDER — SENNOSIDES-DOCUSATE SODIUM 8.6-50 MG PO TABS
2.0000 | ORAL_TABLET | Freq: Two times a day (BID) | ORAL | 6 refills | Status: DC | PRN
  Filled 2021-12-03: qty 60, 15d supply, fill #0

## 2021-12-03 NOTE — Progress Notes (Addendum)
 Assessment & Plan:  Deloss was seen today for diabetes.  Diagnoses and all orders for this visit:  Type 2 diabetes mellitus with hyperglycemia, without long-term current use of insulin (HCC) -     POCT glucose (manual entry) -     POCT glycosylated hemoglobin (Hb A1C) -     CMP14+EGFR -     Microalbumin / creatinine urine ratio -     metFORMIN (GLUCOPHAGE) 850 MG tablet; Take 1 tablet (850 mg total) by mouth daily with breakfast. Continue blood sugar control as discussed in office today, low carbohydrate diet, and regular physical exercise as tolerated, 150 minutes per week (30 min each day, 5 days per week, or 50 min 3 days per week). Keep blood sugar logs with fasting goal of 90-130 mg/dl, post prandial (after you eat) less than 180.  For Hypoglycemia: BS <60 and Hyperglycemia BS >400; contact the clinic ASAP. Annual eye exams and foot exams are recommended.   Hypothyroidism, unspecified type -     Thyroid Panel With TSH -     levothyroxine (SYNTHROID) 175 MCG tablet; Take 1 tablet (175 mcg total) by mouth daily before breakfast.  Intermittent palpitations -     atenolol (TENORMIN) 50 MG tablet; Take 1 tablet (50 mg total) by mouth daily. NEEDS PASS Avoid caffeine products, try to get at least 7 to 8 hours of sleep at night  Chronic constipation Increase water and fiber intake -     senna-docusate (SENOKOT-S) 8.6-50 MG tablet; Take 2 tablets by mouth 2 (two) times daily as needed for mild constipation or moderate constipation.    Patient has been counseled on age-appropriate routine health concerns for screening and prevention. These are reviewed and up-to-date. Referrals have been placed accordingly. Immunizations are up-to-date or declined.    Subjective:   Chief Complaint  Patient presents with   Diabetes   HPI Austin Beasley 41 y.o. male presents to office today for follow up to DM  DM 2 Diabetes is well controlled with metformin 850 mg daily.  Weight is  stable. Lab Results  Component Value Date   HGBA1C 6.5 12/03/2021    Lab Results  Component Value Date   HGBA1C 6.2 09/01/2021    Hypothyroidism Normal TSH and he endorses adherence with levothyroxine 175 mg daily.  Denies any symptoms of hyperthyroidism.  He does endorse chronic constipation. Lab Results  Component Value Date   TSH 1.050 12/03/2021    Review of Systems  Constitutional:  Negative for fever, malaise/fatigue and weight loss.  HENT: Negative.  Negative for nosebleeds.   Eyes: Negative.  Negative for blurred vision, double vision and photophobia.  Respiratory: Negative.  Negative for cough and shortness of breath.   Cardiovascular: Negative.  Negative for chest pain, palpitations and leg swelling.  Gastrointestinal: Negative.  Negative for heartburn, nausea and vomiting.  Musculoskeletal: Negative.  Negative for myalgias.  Neurological: Negative.  Negative for dizziness, focal weakness, seizures and headaches.  Psychiatric/Behavioral: Negative.  Negative for suicidal ideas.     Past Medical History:  Diagnosis Date   Diabetes mellitus without complication (HCC)    High cholesterol    Hypertension    Obesity    Palpitations    Thyroid disease     Past Surgical History:  Procedure Laterality Date   ELEVATION OF DEPRESSED SKULL FRACTURE     age 9    Family History  Problem Relation Age of Onset   Diabetes Mother    Cancer Mother      Hypertension Father    Cancer Sister    Birth defects Maternal Grandmother    Birth defects Maternal Grandfather    Birth defects Paternal Grandmother    Birth defects Paternal Grandfather     Social History Reviewed with no changes to be made today.   Outpatient Medications Prior to Visit  Medication Sig Dispense Refill   Blood Glucose Monitoring Suppl (FREESTYLE LITE) DEVI Use as instructed. Check blood glucose level by fingerstick twice per day.  E11.65 1 each 0   cyclobenzaprine (FLEXERIL) 10 MG tablet Take 1  tablet (10 mg total) by mouth 3 (three) times daily as needed for muscle spasms. 60 tablet 1   fenofibrate (TRICOR) 48 MG tablet Take 1 tablet (48 mg total) by mouth daily. 90 tablet 1   gabapentin (NEURONTIN) 300 MG capsule Take 1 capsule (300 mg total) by mouth 3 (three) times daily. 270 capsule 1   glucose blood test strip CHECK BLOOD GLUCOSE TWICE PER DAY AS INSTRUCTED 100 strip 2   ibuprofen (ADVIL) 800 MG tablet Take 1 tablet (800 mg total) by mouth every 8 (eight) hours as needed. 60 tablet 1   omega-3 acid ethyl esters (LOVAZA) 1 g capsule Take 1 capsule (1 g total) by mouth daily. 90 capsule 1   omeprazole (PRILOSEC) 20 MG capsule Take 1 capsule (20 mg total) by mouth daily. 90 capsule 2   atenolol (TENORMIN) 50 MG tablet Take 1 tablet (50 mg total) by mouth daily. 90 tablet 1   levothyroxine (SYNTHROID) 175 MCG tablet Take 1 tablet (175 mcg total) by mouth daily before breakfast. 30 tablet 1   metFORMIN (GLUCOPHAGE) 850 MG tablet Take 1 tablet (850 mg total) by mouth daily with breakfast. 90 tablet 1   senna-docusate (SENOKOT-S) 8.6-50 MG tablet Take 2 tablets by mouth 2 (two) times daily as needed for mild constipation or moderate constipation. 60 tablet 1   No facility-administered medications prior to visit.    No Known Allergies     Objective:    BP 120/73   Pulse 75   Temp 97.8 F (36.6 C) (Oral)   Ht 6' (1.829 m)   Wt (!) 399 lb 12.8 oz (181.3 kg)   SpO2 98%   BMI 54.22 kg/m  Wt Readings from Last 3 Encounters:  03/04/22 (!) 408 lb 9.6 oz (185.3 kg)  12/03/21 (!) 399 lb 12.8 oz (181.3 kg)  09/01/21 (!) 387 lb (175.5 kg)    Physical Exam Vitals and nursing note reviewed.  Constitutional:      Appearance: He is well-developed.  HENT:     Head: Normocephalic and atraumatic.  Cardiovascular:     Rate and Rhythm: Normal rate and regular rhythm.     Heart sounds: Normal heart sounds. No murmur heard.    No friction rub. No gallop.  Pulmonary:     Effort:  Pulmonary effort is normal. No tachypnea or respiratory distress.     Breath sounds: Normal breath sounds. No decreased breath sounds, wheezing, rhonchi or rales.  Chest:     Chest wall: No tenderness.  Abdominal:     General: Bowel sounds are normal.     Palpations: Abdomen is soft.  Musculoskeletal:        General: Normal range of motion.     Cervical back: Normal range of motion.  Skin:    General: Skin is warm and dry.  Neurological:     Mental Status: He is alert and oriented to person, place, and time.  Coordination: Coordination normal.  Psychiatric:        Behavior: Behavior normal. Behavior is cooperative.        Thought Content: Thought content normal.        Judgment: Judgment normal.          Patient has been counseled extensively about nutrition and exercise as well as the importance of adherence with medications and regular follow-up. The patient was given clear instructions to go to ER or return to medical center if symptoms don't improve, worsen or new problems develop. The patient verbalized understanding.   Follow-up: Return in about 3 months (around 03/04/2022) for DM.   Zelda W Fleming, FNP-BC [facility] [place], [place] [phone]   03/04/2022, 11:02 AM 

## 2021-12-04 LAB — CMP14+EGFR
ALT: 23 IU/L (ref 0–44)
AST: 20 IU/L (ref 0–40)
Albumin/Globulin Ratio: 1.4 (ref 1.2–2.2)
Albumin: 4 g/dL — ABNORMAL LOW (ref 4.1–5.1)
Alkaline Phosphatase: 51 IU/L (ref 44–121)
BUN/Creatinine Ratio: 10 (ref 9–20)
BUN: 13 mg/dL (ref 6–24)
Bilirubin Total: 0.4 mg/dL (ref 0.0–1.2)
CO2: 24 mmol/L (ref 20–29)
Calcium: 9.6 mg/dL (ref 8.7–10.2)
Chloride: 103 mmol/L (ref 96–106)
Creatinine, Ser: 1.25 mg/dL (ref 0.76–1.27)
Globulin, Total: 2.8 g/dL (ref 1.5–4.5)
Glucose: 105 mg/dL — ABNORMAL HIGH (ref 70–99)
Potassium: 4.7 mmol/L (ref 3.5–5.2)
Sodium: 141 mmol/L (ref 134–144)
Total Protein: 6.8 g/dL (ref 6.0–8.5)
eGFR: 74 mL/min/{1.73_m2} (ref >59)

## 2021-12-04 LAB — THYROID PANEL WITH TSH
Free Thyroxine Index: 3.1 (ref 1.2–4.9)
T3 Uptake Ratio: 30 % (ref 24–39)
T4, Total: 10.2 ug/dL (ref 4.5–12.0)
TSH: 1.05 u[IU]/mL (ref 0.450–4.500)

## 2021-12-04 LAB — MICROALBUMIN / CREATININE URINE RATIO
Creatinine, Urine: 127.4 mg/dL
Microalb/Creat Ratio: 11 mg/g creat (ref 0–29)
Microalbumin, Urine: 13.8 ug/mL

## 2021-12-06 ENCOUNTER — Encounter: Payer: Self-pay | Admitting: Nurse Practitioner

## 2021-12-09 ENCOUNTER — Other Ambulatory Visit (HOSPITAL_COMMUNITY): Payer: Self-pay

## 2021-12-10 ENCOUNTER — Other Ambulatory Visit (HOSPITAL_COMMUNITY): Payer: Self-pay

## 2021-12-21 ENCOUNTER — Other Ambulatory Visit: Payer: Self-pay

## 2021-12-29 ENCOUNTER — Other Ambulatory Visit (HOSPITAL_COMMUNITY): Payer: Self-pay

## 2022-01-05 ENCOUNTER — Other Ambulatory Visit (HOSPITAL_COMMUNITY): Payer: Self-pay

## 2022-01-05 ENCOUNTER — Telehealth: Payer: Self-pay | Admitting: Physician Assistant

## 2022-01-05 DIAGNOSIS — R6889 Other general symptoms and signs: Secondary | ICD-10-CM

## 2022-01-05 MED ORDER — BENZONATATE 100 MG PO CAPS
100.0000 mg | ORAL_CAPSULE | Freq: Three times a day (TID) | ORAL | 0 refills | Status: DC | PRN
  Filled 2022-01-05: qty 30, 10d supply, fill #0

## 2022-01-05 MED ORDER — FLUTICASONE PROPIONATE 50 MCG/ACT NA SUSP
2.0000 | Freq: Every day | NASAL | 0 refills | Status: DC
  Filled 2022-01-05: qty 16, 30d supply, fill #0

## 2022-01-05 NOTE — Progress Notes (Signed)
Virtual Visit Consent   Austin Beasley, you are scheduled for a virtual visit with a [facility] provider today. Just as with appointments in the office, your consent must be obtained to participate. Your consent will be active for this visit and any virtual visit you may have with one of our providers in the next 365 days. If you have a MyChart account, a copy of this consent can be sent to you electronically.  As this is a virtual visit, video technology does not allow for your provider to perform a traditional examination. This may limit your provider's ability to fully assess your condition. If your provider identifies any concerns that need to be evaluated in person or the need to arrange testing (such as labs, EKG, etc.), we will make arrangements to do so. Although advances in technology are sophisticated, we cannot ensure that it will always work on either your end or our end. If the connection with a video visit is poor, the visit may have to be switched to a telephone visit. With either a video or telephone visit, we are not always able to ensure that we have a secure connection.  By engaging in this virtual visit, you consent to the provision of healthcare and authorize for your insurance to be billed (if applicable) for the services provided during this visit. Depending on your insurance coverage, you may receive a charge related to this service.  I need to obtain your verbal consent now. Are you willing to proceed with your visit today? Austin Beasley has provided verbal consent on 01/05/2022 for a virtual visit (video or telephone). Piedad Climes, [place]  Date: 01/05/2022 12:21 PM  Virtual Visit via Video Note   I, Piedad Climes, connected with  PAWEL SOULES  ([id number], 08/08/1980) on 01/05/22 at 12:15 PM EDT by a video-enabled telemedicine application and verified that I am speaking with the correct person using two identifiers.  Location: Patient: Virtual Visit  Location Patient: Home Provider: Virtual Visit Location Provider: Home Office   I discussed the limitations of evaluation and management by telemedicine and the availability of in person appointments. The patient expressed understanding and agreed to proceed.    History of Present Illness: Austin Beasley is a 41 y.o. who identifies as a male who was assigned male at birth, and is being seen today for uri symptoms starting Sunday into Monday with sore throat, congestion, aches and low-grade fever, subsequently developing a cough that is productive at times. Notes today slightly better with resolution of fever and hoarseness. Throat improved. Still with cough and congestion. Denies chest pain or SOB. Has tested for COVID x 2 and negative. Is taking OTC Mucinex.   HPI: HPI  Problems:  Patient Active Problem List   Diagnosis Date Noted   Achilles tendinitis of right lower extremity 06/01/2021   Pain due to onychomycosis of toenails of both feet 02/01/2021   Class 3 severe obesity with serious comorbidity and body mass index (BMI) of 50.0 to 59.9 in adult [facility]) 05/16/2019   Type 2 diabetes mellitus with hyperglycemia, without long-term current use of insulin (HCC) 08/07/2018   Diabetes mellitus without complication (HCC) 05/23/2017    Allergies: No Known Allergies Medications:  Current Outpatient Medications:    benzonatate (TESSALON) 100 MG capsule, Take 1 capsule (100 mg total) by mouth 3 (three) times daily as needed for cough., Disp: 30 capsule, Rfl: 0   fluticasone (FLONASE) 50 MCG/ACT nasal spray, Place 2 sprays into  both nostrils daily., Disp: 16 g, Rfl: 0   atenolol (TENORMIN) 50 MG tablet, Take 1 tablet (50 mg total) by mouth daily. NEEDS PASS, Disp: 90 tablet, Rfl: 1   Blood Glucose Monitoring Suppl (FREESTYLE LITE) DEVI, Use as instructed. Check blood glucose level by fingerstick twice per day.  E11.65, Disp: 1 each, Rfl: 0   cyclobenzaprine (FLEXERIL) 10 MG tablet, Take 1 tablet (10  mg total) by mouth 3 (three) times daily as needed for muscle spasms., Disp: 60 tablet, Rfl: 1   fenofibrate (TRICOR) 48 MG tablet, Take 1 tablet (48 mg total) by mouth daily., Disp: 90 tablet, Rfl: 1   gabapentin (NEURONTIN) 300 MG capsule, Take 1 capsule (300 mg total) by mouth 3 (three) times daily., Disp: 270 capsule, Rfl: 1   ibuprofen (ADVIL) 800 MG tablet, Take 1 tablet (800 mg total) by mouth every 8 (eight) hours as needed., Disp: 60 tablet, Rfl: 1   levothyroxine (SYNTHROID) 175 MCG tablet, Take 1 tablet (175 mcg total) by mouth daily before breakfast., Disp: 90 tablet, Rfl: 1   metFORMIN (GLUCOPHAGE) 850 MG tablet, Take 1 tablet (850 mg total) by mouth daily with breakfast., Disp: 90 tablet, Rfl: 1   omega-3 acid ethyl esters (LOVAZA) 1 g capsule, Take 1 capsule (1 g total) by mouth daily., Disp: 90 capsule, Rfl: 1   omeprazole (PRILOSEC) 20 MG capsule, Take 1 capsule (20 mg total) by mouth daily., Disp: 90 capsule, Rfl: 2   senna-docusate (SENOKOT-S) 8.6-50 MG tablet, Take 2 tablets by mouth 2 (two) times daily as needed for mild constipation or moderate constipation., Disp: 60 tablet, Rfl: 6  Observations/Objective: Patient is well-developed, well-nourished in no acute distress.  Resting comfortably at home.  Head is normocephalic, atraumatic.  No labored breathing. Speech is clear and coherent with logical content.  Patient is alert and oriented at baseline.   Assessment and Plan: 1. Flu-like symptoms - fluticasone (FLONASE) 50 MCG/ACT nasal spray; Place 2 sprays into both nostrils daily.  Dispense: 16 g; Refill: 0 - benzonatate (TESSALON) 100 MG capsule; Take 1 capsule (100 mg total) by mouth 3 (three) times daily as needed for cough.  Dispense: 30 capsule; Refill: 0  This definitely seems consistent with a viral illness and some concern for influenza giving abrupt onset and more substantial symptoms while testing negative for COVID. Is already improving with resolution of  fever, chills, aches which is great, but continues with chest congestion and cough. Recommend he increase fluids and rest. Supportive measures, Vitamin recommendations and OTC medications discussed. Rx Tessalon and Flonase. He is to let us know if symptoms are not continuing to improve/resolve. Since COVID -, he can end quarantine once fever free for 24 hours without fever-reducing medication and symptoms improved, but should mask until symptoms resolved. Work note provided.   Follow Up Instructions: I discussed the assessment and treatment plan with the patient. The patient was provided an opportunity to ask questions and all were answered. The patient agreed with the plan and demonstrated an understanding of the instructions.  A copy of instructions were sent to the patient via MyChart unless otherwise noted below.   The patient was advised to call back or seek an in-person evaluation if the symptoms worsen or if the condition fails to improve as anticipated.  Time:  I spent 10 minutes with the patient via telehealth technology discussing the above problems/concerns.    Leeanne Rio, PA-C

## 2022-01-05 NOTE — Patient Instructions (Signed)
Sara Chu, thank you for joining Leeanne Rio, PA-C for today's virtual visit.  While this provider is not your primary care provider (PCP), if your PCP is located in our provider database this encounter information will be shared with them immediately following your visit.   [facility] account gives you access to today's visit and all your visits, tests, and labs performed at [facility] " click here if you don't have a [facility] account or go to [email or web address]  Consent: (Patient) Austin Beasley provided verbal consent for this virtual visit at the beginning of the encounter.  Current Medications:  Current Outpatient Medications:    atenolol (TENORMIN) 50 MG tablet, Take 1 tablet (50 mg total) by mouth daily. NEEDS PASS, Disp: 90 tablet, Rfl: 1   Blood Glucose Monitoring Suppl (FREESTYLE LITE) DEVI, Use as instructed. Check blood glucose level by fingerstick twice per day.  E11.65, Disp: 1 each, Rfl: 0   cyclobenzaprine (FLEXERIL) 10 MG tablet, Take 1 tablet (10 mg total) by mouth 3 (three) times daily as needed for muscle spasms., Disp: 60 tablet, Rfl: 1   fenofibrate (TRICOR) 48 MG tablet, Take 1 tablet (48 mg total) by mouth daily., Disp: 90 tablet, Rfl: 1   gabapentin (NEURONTIN) 300 MG capsule, Take 1 capsule (300 mg total) by mouth 3 (three) times daily., Disp: 270 capsule, Rfl: 1   ibuprofen (ADVIL) 800 MG tablet, Take 1 tablet (800 mg total) by mouth every 8 (eight) hours as needed., Disp: 60 tablet, Rfl: 1   levothyroxine (SYNTHROID) 175 MCG tablet, Take 1 tablet (175 mcg total) by mouth daily before breakfast., Disp: 90 tablet, Rfl: 1   metFORMIN (GLUCOPHAGE) 850 MG tablet, Take 1 tablet (850 mg total) by mouth daily with breakfast., Disp: 90 tablet, Rfl: 1   omega-3 acid ethyl esters (LOVAZA) 1 g capsule, Take 1 capsule (1 g total) by mouth daily., Disp: 90 capsule, Rfl: 1   omeprazole (PRILOSEC) 20 MG capsule, Take 1  capsule (20 mg total) by mouth daily., Disp: 90 capsule, Rfl: 2   senna-docusate (SENOKOT-S) 8.6-50 MG tablet, Take 2 tablets by mouth 2 (two) times daily as needed for mild constipation or moderate constipation., Disp: 60 tablet, Rfl: 6   Medications ordered in this encounter:  No orders of the defined types were placed in this encounter.    *If you need refills on other medications prior to your next appointment, please contact your pharmacy*  Follow-Up: Call back or seek an in-person evaluation if the symptoms worsen or if the condition fails to improve as anticipated.  [place] [phone]  Other Instructions Please keep well-hydrated and get plenty of rest. Rune a humidifier in the bedroom at night.  Recommend Vitamin C 1000 mg daily, Vitamin D3 1000 units daily, and a zinc supplement. Some studies show OTC echinacea supplements to be effective for viral illnesses as well. Ok to continue your Mucinex over-the-counter.  Use the Tessalon and Flonase as directed. Message me if symptoms are not continuing to improve, or if you note new or worsening symptoms despite recommendations given.   Feel better soon!   If you have been instructed to have an in-person evaluation today at a local Urgent Care facility, please use the link below. It will take you to a list of all of our available  Urgent Cares, including address, phone number and hours of operation. Please do not delay care.  [facility]  If you or a family member do not have a primary care provider, use the link below to schedule a visit and establish care. When you choose a [facility] primary care physician or advanced practice provider, you gain a long-term partner in health. Find a Primary Care Provider  Learn more about [facility]'s in-office and virtual care options: [facility]

## 2022-01-07 ENCOUNTER — Encounter: Payer: Self-pay | Admitting: Nurse Practitioner

## 2022-01-21 ENCOUNTER — Encounter: Payer: Self-pay | Admitting: Nurse Practitioner

## 2022-01-25 ENCOUNTER — Other Ambulatory Visit (HOSPITAL_COMMUNITY): Payer: Self-pay

## 2022-01-31 ENCOUNTER — Ambulatory Visit: Payer: No Typology Code available for payment source | Admitting: Podiatry

## 2022-02-17 ENCOUNTER — Other Ambulatory Visit (HOSPITAL_COMMUNITY): Payer: Self-pay

## 2022-03-03 ENCOUNTER — Other Ambulatory Visit (HOSPITAL_BASED_OUTPATIENT_CLINIC_OR_DEPARTMENT_OTHER): Payer: Self-pay

## 2022-03-04 ENCOUNTER — Ambulatory Visit: Payer: Self-pay | Attending: Nurse Practitioner | Admitting: Nurse Practitioner

## 2022-03-04 ENCOUNTER — Other Ambulatory Visit: Payer: Self-pay

## 2022-03-04 ENCOUNTER — Other Ambulatory Visit (HOSPITAL_COMMUNITY): Payer: Self-pay

## 2022-03-04 ENCOUNTER — Encounter: Payer: Self-pay | Admitting: Nurse Practitioner

## 2022-03-04 VITALS — BP 124/80 | HR 58 | Ht 72.0 in | Wt >= 6400 oz

## 2022-03-04 DIAGNOSIS — G8929 Other chronic pain: Secondary | ICD-10-CM

## 2022-03-04 DIAGNOSIS — M25552 Pain in left hip: Secondary | ICD-10-CM

## 2022-03-04 DIAGNOSIS — I10 Essential (primary) hypertension: Secondary | ICD-10-CM

## 2022-03-04 DIAGNOSIS — E1165 Type 2 diabetes mellitus with hyperglycemia: Secondary | ICD-10-CM

## 2022-03-04 DIAGNOSIS — E039 Hypothyroidism, unspecified: Secondary | ICD-10-CM

## 2022-03-04 DIAGNOSIS — K5909 Other constipation: Secondary | ICD-10-CM

## 2022-03-04 MED ORDER — MELOXICAM 15 MG PO TABS
15.0000 mg | ORAL_TABLET | Freq: Every day | ORAL | 1 refills | Status: DC
  Filled 2022-03-04: qty 30, 30d supply, fill #0

## 2022-03-04 MED ORDER — SENNOSIDES-DOCUSATE SODIUM 8.6-50 MG PO TABS
2.0000 | ORAL_TABLET | Freq: Two times a day (BID) | ORAL | 6 refills | Status: DC | PRN
  Filled 2022-03-04: qty 60, 15d supply, fill #0

## 2022-03-04 MED ORDER — SENNOSIDES-DOCUSATE SODIUM 8.6-50 MG PO TABS
2.0000 | ORAL_TABLET | Freq: Two times a day (BID) | ORAL | 6 refills | Status: DC | PRN
  Filled 2022-03-04 – 2023-01-17 (×3): qty 100, 25d supply, fill #0

## 2022-03-04 MED ORDER — OZEMPIC (0.25 OR 0.5 MG/DOSE) 2 MG/3ML ~~LOC~~ SOPN
0.2500 mg | PEN_INJECTOR | SUBCUTANEOUS | 1 refills | Status: DC
  Filled 2022-03-04: qty 3, fill #0
  Filled 2022-03-04: qty 3, 56d supply, fill #0
  Filled 2022-04-24: qty 3, 56d supply, fill #1

## 2022-03-04 MED ORDER — OZEMPIC (0.25 OR 0.5 MG/DOSE) 2 MG/3ML ~~LOC~~ SOPN
0.2500 mg | PEN_INJECTOR | SUBCUTANEOUS | 1 refills | Status: DC
  Filled 2022-03-04: qty 3, fill #0
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
## 2022-03-04 NOTE — Progress Notes (Signed)
No questions

## 2022-03-05 ENCOUNTER — Other Ambulatory Visit (HOSPITAL_COMMUNITY): Payer: Self-pay

## 2022-03-05 LAB — HEMOGLOBIN A1C
Est. average glucose Bld gHb Est-mCnc: 154 mg/dL
Hgb A1c MFr Bld: 7 % — ABNORMAL HIGH (ref 4.8–5.6)

## 2022-03-05 LAB — THYROID PANEL WITH TSH
Free Thyroxine Index: 3.6 (ref 1.2–4.9)
T3 Uptake Ratio: 30 % (ref 24–39)
T4, Total: 11.9 ug/dL (ref 4.5–12.0)
TSH: 1.32 u[IU]/mL (ref 0.450–4.500)

## 2022-03-08 ENCOUNTER — Other Ambulatory Visit: Payer: Self-pay

## 2022-03-08 ENCOUNTER — Other Ambulatory Visit (HOSPITAL_COMMUNITY): Payer: Self-pay

## 2022-03-25 ENCOUNTER — Other Ambulatory Visit (HOSPITAL_COMMUNITY): Payer: Self-pay

## 2022-03-25 ENCOUNTER — Other Ambulatory Visit: Payer: Self-pay

## 2022-04-05 ENCOUNTER — Other Ambulatory Visit: Payer: Self-pay

## 2022-04-11 ENCOUNTER — Other Ambulatory Visit: Payer: Self-pay

## 2022-04-13 ENCOUNTER — Other Ambulatory Visit (HOSPITAL_COMMUNITY): Payer: Self-pay

## 2022-04-13 ENCOUNTER — Other Ambulatory Visit: Payer: Self-pay | Admitting: Nurse Practitioner

## 2022-04-13 DIAGNOSIS — E785 Hyperlipidemia, unspecified: Secondary | ICD-10-CM

## 2022-04-13 MED ORDER — FENOFIBRATE 48 MG PO TABS
48.0000 mg | ORAL_TABLET | Freq: Every day | ORAL | 0 refills | Status: DC
  Filled 2022-04-13 – 2022-05-04 (×2): qty 90, 90d supply, fill #0

## 2022-04-14 ENCOUNTER — Other Ambulatory Visit: Payer: Self-pay

## 2022-04-14 ENCOUNTER — Other Ambulatory Visit (HOSPITAL_COMMUNITY): Payer: Self-pay

## 2022-04-25 ENCOUNTER — Other Ambulatory Visit: Payer: Self-pay

## 2022-04-26 ENCOUNTER — Other Ambulatory Visit: Payer: Self-pay

## 2022-04-27 ENCOUNTER — Other Ambulatory Visit: Payer: Self-pay

## 2022-05-04 ENCOUNTER — Other Ambulatory Visit (HOSPITAL_COMMUNITY): Payer: Self-pay

## 2022-05-09 ENCOUNTER — Other Ambulatory Visit: Payer: Self-pay

## 2022-05-09 ENCOUNTER — Ambulatory Visit: Payer: Medicaid Other | Attending: Nurse Practitioner | Admitting: Nurse Practitioner

## 2022-05-09 ENCOUNTER — Encounter: Payer: Self-pay | Admitting: Nurse Practitioner

## 2022-05-09 VITALS — BP 122/80 | HR 58 | Ht 72.0 in | Wt >= 6400 oz

## 2022-05-09 DIAGNOSIS — E1140 Type 2 diabetes mellitus with diabetic neuropathy, unspecified: Secondary | ICD-10-CM

## 2022-05-09 DIAGNOSIS — L0591 Pilonidal cyst without abscess: Secondary | ICD-10-CM

## 2022-05-09 DIAGNOSIS — E1165 Type 2 diabetes mellitus with hyperglycemia: Secondary | ICD-10-CM | POA: Diagnosis not present

## 2022-05-09 DIAGNOSIS — Z6841 Body Mass Index (BMI) 40.0 and over, adult: Secondary | ICD-10-CM

## 2022-05-09 DIAGNOSIS — Z23 Encounter for immunization: Secondary | ICD-10-CM

## 2022-05-09 DIAGNOSIS — G4733 Obstructive sleep apnea (adult) (pediatric): Secondary | ICD-10-CM

## 2022-05-09 LAB — GLUCOSE, POCT (MANUAL RESULT ENTRY): POC Glucose: 207 mg/dl — AB (ref 70–99)

## 2022-05-09 MED ORDER — MUPIROCIN 2 % EX OINT
1.0000 | TOPICAL_OINTMENT | Freq: Two times a day (BID) | CUTANEOUS | 1 refills | Status: DC
  Filled 2022-05-09: qty 22, 10d supply, fill #0

## 2022-05-09 MED ORDER — SEMAGLUTIDE (1 MG/DOSE) 4 MG/3ML ~~LOC~~ SOPN
1.0000 mg | PEN_INJECTOR | SUBCUTANEOUS | 1 refills | Status: DC
  Filled 2022-05-09: qty 3, 28d supply, fill #0
  Filled 2022-06-05: qty 3, 28d supply, fill #1

## 2022-05-09 NOTE — Progress Notes (Addendum)
Assessment & Plan:  [name] was seen today for medication refill.  Diagnoses and all orders for this visit:  Type 2 diabetes mellitus with hyperglycemia, without long-term current use of insulin (HCC) -     POCT glucose (manual entry) -     Semaglutide, 1 MG/DOSE, 4 MG/3ML SOPN; Inject 1 mg as directed once a week. Continue blood sugar control as discussed in office today, low carbohydrate diet, and regular physical exercise as tolerated, 150 minutes per week (30 min each day, 5 days per week, or 50 min 3 days per week). Keep blood sugar logs with fasting goal of 90-130 mg/dl, post prandial (after you eat) less than 180.  For Hypoglycemia: BS <60 and Hyperglycemia BS >400; contact the clinic ASAP. Annual eye exams and foot exams are recommended.   Pilonidal cyst -     Ambulatory referral to General Surgery -     mupirocin ointment (BACTROBAN) 2 %; Apply 1 Application topically 2 (two) times daily.  Morbid obesity with BMI of 50.0-59.9, adult ([place]) -     Amb Referral to Bariatric Surgery    Patient has been counseled on age-appropriate routine health concerns for screening and prevention. These are reviewed and up-to-date. Referrals have been placed accordingly. Immunizations are up-to-date or declined.    Subjective:   Chief Complaint  Patient presents with   Medication Refill   HPI Austin Beasley 42 y.o. male presents to office today for medication refills.   Interested in gastric bypass surgery. BMI 55.8. Weight has fluctuated between 380-411 currently.   He also has history of re occurring pilonidal abscess. Would like to have this problem area between his buttocks treated permanently if possible. He currently denies any active infection. Uses bactroban in this area when it starts to become irritated   A1c not quite at goal. We will increase ozempic today to 1mg  weekly. He could not tolerate metformin.  Lab Results  Component Value Date   HGBA1C 7.0 (H) 03/04/2022     Blood pressure is well controlled BP Readings from Last 3 Encounters:  05/09/22 122/80  03/04/22 124/80  12/03/21 120/73     Sleep Apnea: Patient presents with possible obstructive sleep apnea. Patent has a a few years history of symptoms of daytime fatigue and morning fatigue. Patient generally gets a few hours of sleep per night, and states they generally have nightime awakenings and difficulty falling back asleep if awakened. Snoring of severe severity is present. Apneic episodes are present. Nasal obstruction is not present.  Patient has not had a tonsillectomy.   Review of Systems  Constitutional:  Negative for fever, malaise/fatigue and weight loss.  HENT: Negative.  Negative for nosebleeds.   Eyes: Negative.  Negative for blurred vision, double vision and photophobia.  Respiratory: Negative.  Negative for cough and shortness of breath.   Cardiovascular: Negative.  Negative for chest pain, palpitations and leg swelling.  Gastrointestinal: Negative.  Negative for heartburn, nausea and vomiting.  Musculoskeletal: Negative.  Negative for myalgias.  Neurological: Negative.  Negative for dizziness, focal weakness, seizures and headaches.  Psychiatric/Behavioral: Negative.  Negative for suicidal ideas.     Past Medical History:  Diagnosis Date   Diabetes mellitus without complication (HCC)    High cholesterol    Hypertension    Obesity    Palpitations    Thyroid disease     Past Surgical History:  Procedure Laterality Date   ELEVATION OF DEPRESSED SKULL FRACTURE     age 31  Family History  Problem Relation Age of Onset   Diabetes Mother    Cancer Mother    Hypertension Father    Cancer Sister    Birth defects Maternal Grandmother    Birth defects Maternal Grandfather    Birth defects Paternal Grandmother    Birth defects Paternal Grandfather     Social History Reviewed with no changes to be made today.   Outpatient Medications Prior to Visit  Medication Sig  Dispense Refill   atenolol (TENORMIN) 50 MG tablet Take 1 tablet (50 mg total) by mouth daily. NEEDS PASS 90 tablet 1   Blood Glucose Monitoring Suppl (FREESTYLE LITE) DEVI Use as instructed. Check blood glucose level by fingerstick twice per day.  E11.65 1 each 0   cyclobenzaprine (FLEXERIL) 10 MG tablet Take 1 tablet (10 mg total) by mouth 3 (three) times daily as needed for muscle spasms. 60 tablet 1   fenofibrate (TRICOR) 48 MG tablet Take 1 tablet (48 mg total) by mouth daily. 90 tablet 0   fluticasone (FLONASE) 50 MCG/ACT nasal spray Place 2 sprays into both nostrils daily. 16 g 0   gabapentin (NEURONTIN) 300 MG capsule Take 1 capsule (300 mg total) by mouth 3 (three) times daily. 270 capsule 1   ibuprofen (ADVIL) 800 MG tablet Take 1 tablet (800 mg total) by mouth every 8 (eight) hours as needed. 60 tablet 1   levothyroxine (SYNTHROID) 175 MCG tablet Take 1 tablet (175 mcg total) by mouth daily before breakfast. 90 tablet 1   meloxicam (MOBIC) 15 MG tablet Take 1 tablet (15 mg total) by mouth daily. 30 tablet 1   metFORMIN (GLUCOPHAGE) 850 MG tablet Take 1 tablet (850 mg total) by mouth daily with breakfast. 90 tablet 1   omega-3 acid ethyl esters (LOVAZA) 1 g capsule Take 1 capsule (1 g total) by mouth daily. 90 capsule 1   omeprazole (PRILOSEC) 20 MG capsule Take 1 capsule (20 mg total) by mouth daily. 90 capsule 2   senna-docusate (SENOKOT-S) 8.6-50 MG tablet Take 2 tablets by mouth 2 (two) times daily as needed for mild constipation or moderate constipation. 100 tablet 6   Semaglutide,0.25 or 0.5MG /DOS, (OZEMPIC, 0.25 OR 0.5 MG/DOSE,) 2 MG/3ML SOPN Inject 0.25 mg into the skin once a week 3 mL 1   No facility-administered medications prior to visit.    No Known Allergies     Objective:    BP 122/80 (BP Location: Right Arm, Patient Position: Sitting, Cuff Size: Large)   Pulse (!) 58   Ht 6' (1.829 m)   Wt (!) 411 lb 6.4 oz (186.6 kg)   SpO2 95%   BMI 55.80 kg/m  Wt Readings  from Last 3 Encounters:  05/09/22 (!) 411 lb 6.4 oz (186.6 kg)  03/04/22 (!) 408 lb 9.6 oz (185.3 kg)  12/03/21 (!) 399 lb 12.8 oz (181.3 kg)    Physical Exam Vitals and nursing note reviewed.  Constitutional:      Appearance: He is well-developed.  HENT:     Head: Normocephalic and atraumatic.  Cardiovascular:     Rate and Rhythm: Regular rhythm. Bradycardia present.     Heart sounds: Normal heart sounds. No murmur heard.    No friction rub. No gallop.     Comments: asymptomatic Pulmonary:     Effort: Pulmonary effort is normal. No tachypnea or respiratory distress.     Breath sounds: Normal breath sounds. No decreased breath sounds, wheezing, rhonchi or rales.  Chest:  Chest wall: No tenderness.  Abdominal:     General: Bowel sounds are normal.     Palpations: Abdomen is soft.  Musculoskeletal:        General: Normal range of motion.     Cervical back: Normal range of motion.  Skin:    General: Skin is warm and dry.  Neurological:     Mental Status: He is alert and oriented to person, place, and time.     Coordination: Coordination normal.  Psychiatric:        Behavior: Behavior normal. Behavior is cooperative.        Thought Content: Thought content normal.        Judgment: Judgment normal.          Patient has been counseled extensively about nutrition and exercise as well as the importance of adherence with medications and regular follow-up. The patient was given clear instructions to go to ER or return to medical center if symptoms don't improve, worsen or new problems develop. The patient verbalized understanding.   Follow-up: Return for labs first week of april. See me 2nd week of July for A1c.   Gildardo Pounds, FNP-BC [facility] [place], [place]   05/09/2022, 10:57 AM

## 2022-05-10 ENCOUNTER — Other Ambulatory Visit: Payer: Self-pay

## 2022-05-11 ENCOUNTER — Other Ambulatory Visit: Payer: Self-pay

## 2022-05-26 ENCOUNTER — Other Ambulatory Visit: Payer: Self-pay | Admitting: Nurse Practitioner

## 2022-05-26 ENCOUNTER — Encounter: Payer: Self-pay | Admitting: Nurse Practitioner

## 2022-05-26 NOTE — Addendum Note (Signed)
Addended by: Geryl Rankins on: 05/26/2022 08:16 PM   Modules accepted: Orders

## 2022-06-05 ENCOUNTER — Other Ambulatory Visit: Payer: Self-pay | Admitting: Nurse Practitioner

## 2022-06-05 DIAGNOSIS — R002 Palpitations: Secondary | ICD-10-CM

## 2022-06-05 DIAGNOSIS — E1165 Type 2 diabetes mellitus with hyperglycemia: Secondary | ICD-10-CM

## 2022-06-05 DIAGNOSIS — E039 Hypothyroidism, unspecified: Secondary | ICD-10-CM

## 2022-06-06 ENCOUNTER — Ambulatory Visit: Payer: Medicaid Other | Attending: Nurse Practitioner

## 2022-06-06 ENCOUNTER — Other Ambulatory Visit: Payer: Self-pay | Admitting: Nurse Practitioner

## 2022-06-06 ENCOUNTER — Other Ambulatory Visit: Payer: Self-pay

## 2022-06-06 DIAGNOSIS — E1140 Type 2 diabetes mellitus with diabetic neuropathy, unspecified: Secondary | ICD-10-CM

## 2022-06-06 MED ORDER — METFORMIN HCL 850 MG PO TABS
850.0000 mg | ORAL_TABLET | Freq: Every day | ORAL | 1 refills | Status: DC
  Filled 2022-06-06: qty 90, 90d supply, fill #0
  Filled 2022-08-15: qty 90, 90d supply, fill #1

## 2022-06-06 MED ORDER — LEVOTHYROXINE SODIUM 175 MCG PO TABS
175.0000 ug | ORAL_TABLET | Freq: Every day | ORAL | 1 refills | Status: DC
  Filled 2022-06-06: qty 90, 90d supply, fill #0
  Filled 2022-09-14: qty 90, 90d supply, fill #1

## 2022-06-06 MED ORDER — ATENOLOL 50 MG PO TABS
50.0000 mg | ORAL_TABLET | Freq: Every day | ORAL | 1 refills | Status: DC
  Filled 2022-06-06: qty 90, 90d supply, fill #0
  Filled 2022-08-15: qty 90, 90d supply, fill #1

## 2022-06-07 ENCOUNTER — Other Ambulatory Visit: Payer: Self-pay

## 2022-06-07 LAB — HEMOGLOBIN A1C
Est. average glucose Bld gHb Est-mCnc: 154 mg/dL
Hgb A1c MFr Bld: 7 % — ABNORMAL HIGH (ref 4.8–5.6)

## 2022-06-27 ENCOUNTER — Encounter: Payer: Self-pay | Admitting: Podiatry

## 2022-06-27 ENCOUNTER — Ambulatory Visit: Payer: Medicaid Other | Admitting: Podiatry

## 2022-06-27 DIAGNOSIS — E119 Type 2 diabetes mellitus without complications: Secondary | ICD-10-CM | POA: Diagnosis not present

## 2022-06-27 DIAGNOSIS — B351 Tinea unguium: Secondary | ICD-10-CM | POA: Diagnosis not present

## 2022-06-27 DIAGNOSIS — M79674 Pain in right toe(s): Secondary | ICD-10-CM | POA: Diagnosis not present

## 2022-06-27 DIAGNOSIS — M79675 Pain in left toe(s): Secondary | ICD-10-CM | POA: Diagnosis not present

## 2022-06-27 NOTE — Progress Notes (Signed)
This patient returns to my office for at risk foot care.  This patient requires this care by a professional since this patient will be at risk due to having diabetes.  Patient hs pain in his achilles tendon right foot after a day of work.  This patient is unable to cut nails himself since the patient cannot reach his nails.These nails are painful walking and wearing shoes.  This patient presents for at risk foot care today.  General Appearance  Alert, conversant and in no acute stress.  Vascular  Dorsalis pedis and posterior tibial  pulses are palpable  bilaterally.  Capillary return is within normal limits  bilaterally. Temperature is within normal limits  bilaterally.  Neurologic  Senn-Weinstein monofilament wire test within normal limits  bilaterally. Muscle power within normal limits bilaterally.  Nails Thick disfigured discolored nails with subungual debris  from hallux to fifth toes bilaterally. No evidence of bacterial infection or drainage bilaterally.  Orthopedic  No limitations of motion  feet .  No crepitus or effusions noted.  No bony pathology or digital deformities noted.  Skin  normotropic skin with no porokeratosis noted bilaterally.  No signs of infections or ulcers noted.     Onychomycosis  Pain in right toes  Pain in left toes  Consent was obtained for treatment procedures.   Mechanical debridement of nails 1-5  bilaterally performed with a nail nipper.  Filed with dremel without incident.      Return office visit   4 months                  Told patient to return for periodic foot care and evaluation due to potential at risk complications.   Helane Gunther DPM

## 2022-07-01 ENCOUNTER — Other Ambulatory Visit: Payer: Self-pay

## 2022-07-01 ENCOUNTER — Other Ambulatory Visit: Payer: Self-pay | Admitting: Nurse Practitioner

## 2022-07-01 DIAGNOSIS — E1165 Type 2 diabetes mellitus with hyperglycemia: Secondary | ICD-10-CM

## 2022-07-01 MED ORDER — OZEMPIC (1 MG/DOSE) 4 MG/3ML ~~LOC~~ SOPN
1.0000 mg | PEN_INJECTOR | SUBCUTANEOUS | 1 refills | Status: DC
  Filled 2022-07-01: qty 9, 84d supply, fill #0

## 2022-07-15 ENCOUNTER — Other Ambulatory Visit: Payer: Self-pay

## 2022-07-15 ENCOUNTER — Other Ambulatory Visit (HOSPITAL_COMMUNITY): Payer: Self-pay

## 2022-08-15 ENCOUNTER — Other Ambulatory Visit: Payer: Self-pay | Admitting: Family Medicine

## 2022-08-15 ENCOUNTER — Other Ambulatory Visit (HOSPITAL_COMMUNITY): Payer: Self-pay

## 2022-08-15 DIAGNOSIS — E785 Hyperlipidemia, unspecified: Secondary | ICD-10-CM

## 2022-08-15 MED ORDER — FENOFIBRATE 48 MG PO TABS
48.0000 mg | ORAL_TABLET | Freq: Every day | ORAL | 0 refills | Status: DC
  Filled 2022-08-15: qty 90, 90d supply, fill #0

## 2022-08-18 ENCOUNTER — Other Ambulatory Visit: Payer: Self-pay

## 2022-08-22 ENCOUNTER — Other Ambulatory Visit (HOSPITAL_BASED_OUTPATIENT_CLINIC_OR_DEPARTMENT_OTHER): Payer: Self-pay

## 2022-08-22 MED ORDER — OZEMPIC (2 MG/DOSE) 8 MG/3ML ~~LOC~~ SOPN
2.0000 mg | PEN_INJECTOR | SUBCUTANEOUS | 0 refills | Status: DC
  Filled 2022-08-22: qty 9, 84d supply, fill #0
  Filled 2022-08-25: qty 3, 28d supply, fill #0
  Filled 2022-09-14 – 2022-09-15 (×3): qty 3, 28d supply, fill #1
  Filled 2022-10-17: qty 3, 28d supply, fill #2

## 2022-08-22 MED ORDER — BUPROPION HCL ER (XL) 150 MG PO TB24
150.0000 mg | ORAL_TABLET | Freq: Every day | ORAL | 1 refills | Status: DC
  Filled 2022-08-22 – 2022-08-25 (×2): qty 30, 30d supply, fill #0

## 2022-08-25 ENCOUNTER — Other Ambulatory Visit: Payer: Self-pay

## 2022-08-25 ENCOUNTER — Other Ambulatory Visit (HOSPITAL_BASED_OUTPATIENT_CLINIC_OR_DEPARTMENT_OTHER): Payer: Self-pay

## 2022-08-26 ENCOUNTER — Other Ambulatory Visit (HOSPITAL_BASED_OUTPATIENT_CLINIC_OR_DEPARTMENT_OTHER): Payer: Self-pay

## 2022-09-14 ENCOUNTER — Other Ambulatory Visit: Payer: Self-pay

## 2022-09-14 ENCOUNTER — Ambulatory Visit: Payer: Medicaid Other | Attending: Nurse Practitioner | Admitting: Nurse Practitioner

## 2022-09-14 VITALS — BP 111/75 | HR 63 | Ht 72.0 in | Wt 392.6 lb

## 2022-09-14 DIAGNOSIS — Z7985 Long-term (current) use of injectable non-insulin antidiabetic drugs: Secondary | ICD-10-CM | POA: Diagnosis not present

## 2022-09-14 DIAGNOSIS — E669 Obesity, unspecified: Secondary | ICD-10-CM

## 2022-09-14 DIAGNOSIS — F17210 Nicotine dependence, cigarettes, uncomplicated: Secondary | ICD-10-CM

## 2022-09-14 DIAGNOSIS — Z6841 Body Mass Index (BMI) 40.0 and over, adult: Secondary | ICD-10-CM

## 2022-09-14 DIAGNOSIS — E039 Hypothyroidism, unspecified: Secondary | ICD-10-CM

## 2022-09-14 DIAGNOSIS — R002 Palpitations: Secondary | ICD-10-CM

## 2022-09-14 DIAGNOSIS — E785 Hyperlipidemia, unspecified: Secondary | ICD-10-CM

## 2022-09-14 DIAGNOSIS — Z7984 Long term (current) use of oral hypoglycemic drugs: Secondary | ICD-10-CM | POA: Diagnosis not present

## 2022-09-14 DIAGNOSIS — E1165 Type 2 diabetes mellitus with hyperglycemia: Secondary | ICD-10-CM

## 2022-09-14 DIAGNOSIS — K2100 Gastro-esophageal reflux disease with esophagitis, without bleeding: Secondary | ICD-10-CM

## 2022-09-14 DIAGNOSIS — F17200 Nicotine dependence, unspecified, uncomplicated: Secondary | ICD-10-CM

## 2022-09-14 LAB — POCT GLYCOSYLATED HEMOGLOBIN (HGB A1C): HbA1c, POC (controlled diabetic range): 6 % (ref 0.0–7.0)

## 2022-09-14 MED ORDER — METFORMIN HCL 850 MG PO TABS
850.0000 mg | ORAL_TABLET | Freq: Every day | ORAL | 1 refills | Status: DC
  Filled 2022-09-14: qty 90, 90d supply, fill #0

## 2022-09-14 MED ORDER — OMEGA-3-ACID ETHYL ESTERS 1 G PO CAPS
1.0000 g | ORAL_CAPSULE | Freq: Every day | ORAL | 1 refills | Status: DC
  Filled 2022-09-14 – 2022-10-14 (×2): qty 90, 90d supply, fill #0

## 2022-09-14 MED ORDER — OMEPRAZOLE 20 MG PO CPDR
20.0000 mg | DELAYED_RELEASE_CAPSULE | Freq: Every day | ORAL | 2 refills | Status: DC
  Filled 2022-09-14 – 2022-10-29 (×2): qty 90, 90d supply, fill #0
  Filled 2022-12-14: qty 90, 90d supply, fill #1

## 2022-09-14 MED ORDER — ATENOLOL 50 MG PO TABS
50.0000 mg | ORAL_TABLET | Freq: Every day | ORAL | 1 refills | Status: DC
  Filled 2022-09-14 – 2022-12-14 (×2): qty 90, 90d supply, fill #0
  Filled 2023-01-31 – 2023-03-16 (×2): qty 90, 90d supply, fill #1

## 2022-09-14 MED ORDER — BUPROPION HCL ER (XL) 150 MG PO TB24
150.0000 mg | ORAL_TABLET | Freq: Every day | ORAL | 1 refills | Status: DC
  Filled 2022-09-14 – 2022-09-19 (×4): qty 90, 90d supply, fill #0

## 2022-09-14 MED ORDER — LEVOTHYROXINE SODIUM 175 MCG PO TABS
175.0000 ug | ORAL_TABLET | Freq: Every day | ORAL | 1 refills | Status: DC
  Filled 2022-12-14 (×2): qty 90, 90d supply, fill #0
  Filled 2023-01-31 – 2023-03-16 (×2): qty 90, 90d supply, fill #1

## 2022-09-14 MED ORDER — FENOFIBRATE 48 MG PO TABS
48.0000 mg | ORAL_TABLET | Freq: Every day | ORAL | 0 refills | Status: DC
  Filled 2022-09-14 – 2022-11-14 (×3): qty 90, 90d supply, fill #0

## 2022-09-14 NOTE — Progress Notes (Signed)
Assessment & Plan:  Austin Beasley was seen today for diabetes.  Diagnoses and all orders for this visit:  Type 2 diabetes mellitus with hyperglycemia, without long-term current use of insulin (HCC) -     POCT glycosylated hemoglobin (Hb A1C) -     Ambulatory referral to Ophthalmology -     metFORMIN (GLUCOPHAGE) 850 MG tablet; Take 1 tablet (850 mg total) by mouth daily with breakfast.Continue blood sugar control as discussed in office today, low carbohydrate diet, and regular physical exercise as tolerated, 150 minutes per week (30 min each day, 5 days per week, or 50 min 3 days per week). Keep blood sugar logs with fasting goal of 90-130 mg/dl, post prandial (after you eat) less than 180.  For Hypoglycemia: BS <60 and Hyperglycemia BS >400; contact the clinic ASAP. Annual eye exams and foot exams are recommended.   Intermittent palpitations Well controlled -     atenolol (TENORMIN) 50 MG tablet; Take 1 tablet (50 mg total) by mouth daily.  Dyslipidemia, goal LDL below 70 -     fenofibrate (TRICOR) 48 MG tablet; Take 1 tablet (48 mg total) by mouth daily. -     omega-3 acid ethyl esters (LOVAZA) 1 g capsule; Take 1 capsule (1 g total) by mouth daily.  Hypothyroidism, unspecified type -     levothyroxine (SYNTHROID) 175 MCG tablet; Take 1 tablet (175 mcg total) by mouth daily before breakfast.  Gastroesophageal reflux disease with esophagitis without hemorrhage Well controlled -     omeprazole (PRILOSEC) 20 MG capsule; Take 1 capsule (20 mg total) by mouth daily. INSTRUCTIONS: Avoid GERD Triggers: acidic, spicy or fried foods, caffeine, coffee, sodas,  alcohol and chocolate.    Tobacco dependence -     buPROPion (WELLBUTRIN XL) 150 MG 24 hr tablet; Take 1 tablet (150 mg total) by mouth daily.    Patient has been counseled on age-appropriate routine health concerns for screening and prevention. These are reviewed and up-to-date. Referrals have been placed accordingly. Immunizations are  up-to-date or declined.    Subjective:   Chief Complaint  Patient presents with   Diabetes   HPI Austin Beasley 42 y.o. male presents to office today for follow up to DM.   He is currently being followed by the bariatric clinic for weight loss surgery. A medical clearance form was filled out by me today and faxed the office. He is awaiting sleep study that has been scheduled. Has been prescribed Wellbutrin for smoking cessation. Does not feel this is really working but declines nicotine patches/lozenges.   DM 2 Diabetes is well controlled. A1c down from 7 to 6.0 He is taking metformin 850 mg daily and ozempic was just recently increased to 2 mg weekly. He does endorses slightly worsening constipation. He is taking OTC senna for this.  Lab Results  Component Value Date   HGBA1C 6.0 09/14/2022    Lab Results  Component Value Date   HGBA1C 7.0 (H) 06/06/2022  LDL at goal.   Lab Results  Component Value Date   LDLCALC 56 09/01/2021    Hypothyroidism Thyroid levels at goal with levothyroxine 175 mg daily. Aside from chronic constipation he does not endorse any symptoms of hypothyroidism or hyperthyroidism.  Lab Results  Component Value Date   TSH 1.320 03/04/2022    Review of Systems  Constitutional:  Negative for fever, malaise/fatigue and weight loss.  HENT: Negative.  Negative for nosebleeds.   Eyes: Negative.  Negative for blurred vision, double vision and  photophobia.  Respiratory: Negative.  Negative for cough and shortness of breath.   Cardiovascular: Negative.  Negative for chest pain, palpitations and leg swelling.  Gastrointestinal:  Positive for constipation. Negative for heartburn, nausea and vomiting.  Musculoskeletal: Negative.  Negative for myalgias.  Neurological: Negative.  Negative for dizziness, focal weakness, seizures and headaches.  Psychiatric/Behavioral: Negative.  Negative for suicidal ideas.     Past Medical History:  Diagnosis Date   Diabetes  mellitus without complication (HCC)    High cholesterol    Hypertension    Obesity    Palpitations    Thyroid disease     Past Surgical History:  Procedure Laterality Date   ELEVATION OF DEPRESSED SKULL FRACTURE     age 35    Family History  Problem Relation Age of Onset   Diabetes Mother    Cancer Mother    Hypertension Father    Cancer Sister    Birth defects Maternal Grandmother    Birth defects Maternal Grandfather    Birth defects Paternal Grandmother    Birth defects Paternal Grandfather     Social History Reviewed with no changes to be made today.   Outpatient Medications Prior to Visit  Medication Sig Dispense Refill   Blood Glucose Monitoring Suppl (FREESTYLE LITE) DEVI Use as instructed. Check blood glucose level by fingerstick twice per day.  E11.65 1 each 0   cyclobenzaprine (FLEXERIL) 10 MG tablet Take 1 tablet (10 mg total) by mouth 3 (three) times daily as needed for muscle spasms. 60 tablet 1   fluticasone (FLONASE) 50 MCG/ACT nasal spray Place 2 sprays into both nostrils daily. 16 g 0   gabapentin (NEURONTIN) 300 MG capsule Take 1 capsule (300 mg total) by mouth 3 (three) times daily. 270 capsule 1   ibuprofen (ADVIL) 800 MG tablet Take 1 tablet (800 mg total) by mouth every 8 (eight) hours as needed. 60 tablet 1   Semaglutide, 2 MG/DOSE, (OZEMPIC, 2 MG/DOSE,) 8 MG/3ML SOPN Inject 2 mg into the skin once a week. 9 mL 0   atenolol (TENORMIN) 50 MG tablet Take 1 tablet (50 mg total) by mouth daily. 90 tablet 1   buPROPion (WELLBUTRIN XL) 150 MG 24 hr tablet Take 1 tablet (150 mg total) by mouth daily. 30 tablet 1   fenofibrate (TRICOR) 48 MG tablet Take 1 tablet (48 mg total) by mouth daily. 90 tablet 0   levothyroxine (SYNTHROID) 175 MCG tablet Take 1 tablet (175 mcg total) by mouth daily before breakfast. 90 tablet 1   metFORMIN (GLUCOPHAGE) 850 MG tablet Take 1 tablet (850 mg total) by mouth daily with breakfast. 90 tablet 1   omega-3 acid ethyl esters  (LOVAZA) 1 g capsule Take 1 capsule (1 g total) by mouth daily. 90 capsule 1   omeprazole (PRILOSEC) 20 MG capsule Take 1 capsule (20 mg total) by mouth daily. 90 capsule 2   senna-docusate (SENOKOT-S) 8.6-50 MG tablet Take 2 tablets by mouth 2 (two) times daily as needed for mild constipation or moderate constipation. 100 tablet 6   meloxicam (MOBIC) 15 MG tablet Take 1 tablet (15 mg total) by mouth daily. (Patient not taking: Reported on 09/14/2022) 30 tablet 1   mupirocin ointment (BACTROBAN) 2 % Apply 1 Application topically 2 (two) times daily. (Patient not taking: Reported on 09/14/2022) 22 g 1   Semaglutide, 1 MG/DOSE, (OZEMPIC, 1 MG/DOSE,) 4 MG/3ML SOPN Inject 1 mg as directed once a week. (Patient not taking: Reported on 09/14/2022) 9 mL 1  No facility-administered medications prior to visit.    No Known Allergies     Objective:    BP 111/75 (BP Location: Left Arm, Patient Position: Sitting, Cuff Size: Large)   Pulse 63   Ht 6' (1.829 m)   Wt (!) 392 lb 9.6 oz (178.1 kg)   SpO2 95%   BMI 53.25 kg/m  Wt Readings from Last 3 Encounters:  09/14/22 (!) 392 lb 9.6 oz (178.1 kg)  05/09/22 (!) 411 lb 6.4 oz (186.6 kg)  03/04/22 (!) 408 lb 9.6 oz (185.3 kg)    Physical Exam Vitals and nursing note reviewed.  Constitutional:      Appearance: He is well-developed.  HENT:     Head: Normocephalic and atraumatic.  Cardiovascular:     Rate and Rhythm: Normal rate and regular rhythm.     Heart sounds: Normal heart sounds. No murmur heard.    No friction rub. No gallop.  Pulmonary:     Effort: Pulmonary effort is normal. No tachypnea or respiratory distress.     Breath sounds: Normal breath sounds. No decreased breath sounds, wheezing, rhonchi or rales.  Chest:     Chest wall: No tenderness.  Abdominal:     General: Bowel sounds are normal.     Palpations: Abdomen is soft.  Musculoskeletal:        General: Normal range of motion.     Cervical back: Normal range of motion.   Skin:    General: Skin is warm and dry.  Neurological:     Mental Status: He is alert and oriented to person, place, and time.     Coordination: Coordination normal.  Psychiatric:        Behavior: Behavior normal. Behavior is cooperative.        Thought Content: Thought content normal.        Judgment: Judgment normal.          Patient has been counseled extensively about nutrition and exercise as well as the importance of adherence with medications and regular follow-up. The patient was given clear instructions to go to ER or return to medical center if symptoms don't improve, worsen or new problems develop. The patient verbalized understanding.   Follow-up: Return in about 3 months (around 12/15/2022).   Claiborne Rigg, FNP-BC [facility] [place], [place] [phone]   09/14/2022, 8:22 PM

## 2022-09-15 ENCOUNTER — Other Ambulatory Visit: Payer: Self-pay

## 2022-09-19 ENCOUNTER — Other Ambulatory Visit: Payer: Self-pay

## 2022-09-23 LAB — HM DIABETES EYE EXAM

## 2022-09-26 ENCOUNTER — Encounter: Payer: Self-pay | Admitting: Podiatry

## 2022-09-26 ENCOUNTER — Ambulatory Visit (INDEPENDENT_AMBULATORY_CARE_PROVIDER_SITE_OTHER): Payer: Medicaid Other | Admitting: Podiatry

## 2022-09-26 DIAGNOSIS — B351 Tinea unguium: Secondary | ICD-10-CM

## 2022-09-26 DIAGNOSIS — M79675 Pain in left toe(s): Secondary | ICD-10-CM

## 2022-09-26 DIAGNOSIS — E119 Type 2 diabetes mellitus without complications: Secondary | ICD-10-CM

## 2022-09-26 DIAGNOSIS — M79674 Pain in right toe(s): Secondary | ICD-10-CM

## 2022-09-26 NOTE — Progress Notes (Signed)
This patient returns to my office for at risk foot care.  This patient requires this care by a professional since this patient will be at risk due to having diabetes.  Patient hs pain in his achilles tendon right foot after a day of work.  This patient is unable to cut nails himself since the patient cannot reach his nails.These nails are painful walking and wearing shoes.  This patient presents for at risk foot care today.  General Appearance  Alert, conversant and in no acute stress.  Vascular  Dorsalis pedis and posterior tibial  pulses are palpable  bilaterally.  Capillary return is within normal limits  bilaterally. Temperature is within normal limits  bilaterally.  Neurologic  Senn-Weinstein monofilament wire test within normal limits  bilaterally. Muscle power within normal limits bilaterally.  Nails Thick disfigured discolored nails with subungual debris  from hallux to fifth toes bilaterally. No evidence of bacterial infection or drainage bilaterally.  Orthopedic  No limitations of motion  feet .  No crepitus or effusions noted.  No bony pathology or digital deformities noted.  Skin  normotropic skin with no porokeratosis noted bilaterally.  No signs of infections or ulcers noted.     Onychomycosis  Pain in right toes  Pain in left toes  Consent was obtained for treatment procedures.   Mechanical debridement of nails 1-5  bilaterally performed with a nail nipper.  Filed with dremel without incident.      Return office visit   4 months                  Told patient to return for periodic foot care and evaluation due to potential at risk complications.   Helane Gunther DPM

## 2022-10-06 ENCOUNTER — Other Ambulatory Visit (HOSPITAL_BASED_OUTPATIENT_CLINIC_OR_DEPARTMENT_OTHER): Payer: Self-pay

## 2022-10-06 MED ORDER — BUPROPION HCL ER (XL) 300 MG PO TB24
300.0000 mg | ORAL_TABLET | Freq: Every day | ORAL | 2 refills | Status: DC
Start: 2022-10-06 — End: 2023-01-13
  Filled 2022-10-06 – 2022-10-07 (×2): qty 30, 30d supply, fill #0
  Filled 2022-11-13 – 2022-11-14 (×2): qty 30, 30d supply, fill #1
  Filled 2022-12-06: qty 30, 30d supply, fill #2

## 2022-10-07 ENCOUNTER — Other Ambulatory Visit: Payer: Self-pay

## 2022-10-08 ENCOUNTER — Other Ambulatory Visit (HOSPITAL_BASED_OUTPATIENT_CLINIC_OR_DEPARTMENT_OTHER): Payer: Self-pay

## 2022-10-14 ENCOUNTER — Other Ambulatory Visit: Payer: Self-pay

## 2022-10-20 ENCOUNTER — Other Ambulatory Visit: Payer: Self-pay

## 2022-10-20 MED ORDER — VITAMIN D3 1.25 MG (50000 UT) PO CAPS
50000.0000 [IU] | ORAL_CAPSULE | ORAL | 0 refills | Status: DC
Start: 2022-10-20 — End: 2022-11-17
  Filled 2022-10-20: qty 12, 84d supply, fill #0

## 2022-10-21 ENCOUNTER — Other Ambulatory Visit: Payer: Self-pay

## 2022-10-31 ENCOUNTER — Other Ambulatory Visit: Payer: Self-pay

## 2022-11-02 ENCOUNTER — Other Ambulatory Visit (HOSPITAL_COMMUNITY): Payer: Self-pay

## 2022-11-02 ENCOUNTER — Other Ambulatory Visit: Payer: Self-pay

## 2022-11-02 MED ORDER — LEVOFLOXACIN 500 MG PO TABS
ORAL_TABLET | ORAL | 0 refills | Status: DC
  Filled 2022-11-02: qty 1, 1d supply, fill #0

## 2022-11-02 MED ORDER — METOCLOPRAMIDE HCL 10 MG PO TABS
ORAL_TABLET | ORAL | 0 refills | Status: DC
  Filled 2022-11-02: qty 1, 1d supply, fill #0

## 2022-11-02 MED ORDER — URSODIOL 300 MG PO CAPS
300.0000 mg | ORAL_CAPSULE | Freq: Two times a day (BID) | ORAL | 5 refills | Status: DC
  Filled 2022-11-02: qty 60, 30d supply, fill #0
  Filled 2022-12-22: qty 60, 30d supply, fill #1
  Filled 2023-01-31: qty 60, 30d supply, fill #2
  Filled 2023-02-27: qty 60, 30d supply, fill #3
  Filled 2023-03-31: qty 60, 30d supply, fill #4
  Filled 2023-04-30: qty 60, 30d supply, fill #5

## 2022-11-02 MED ORDER — APREPITANT 40 MG PO CAPS
ORAL_CAPSULE | ORAL | 0 refills | Status: DC
  Filled 2022-11-02: qty 1, 1d supply, fill #0

## 2022-11-02 MED ORDER — ONDANSETRON 8 MG PO TBDP
8.0000 mg | ORAL_TABLET | Freq: Three times a day (TID) | ORAL | 1 refills | Status: DC | PRN
  Filled 2022-11-02: qty 30, 10d supply, fill #0

## 2022-11-03 ENCOUNTER — Other Ambulatory Visit: Payer: Self-pay

## 2022-11-04 ENCOUNTER — Other Ambulatory Visit: Payer: Self-pay

## 2022-11-10 ENCOUNTER — Other Ambulatory Visit: Payer: Self-pay

## 2022-11-14 ENCOUNTER — Other Ambulatory Visit: Payer: Self-pay

## 2022-11-15 ENCOUNTER — Other Ambulatory Visit: Payer: Self-pay

## 2022-11-16 HISTORY — PX: LAPAROSCOPIC GASTRIC SLEEVE RESECTION: SHX5895

## 2022-11-18 ENCOUNTER — Telehealth: Payer: Self-pay | Admitting: *Deleted

## 2022-11-18 NOTE — Transitions of Care (Post Inpatient/ED Visit) (Signed)
11/18/2022  Name: Austin Beasley MRN: [id number] DOB: 1980-11-15  Today's TOC FU Call Status: Today's TOC FU Call Status:: Successful TOC FU Call Completed TOC FU Call Complete Date: 11/18/22 Patient's Name and Date of Birth confirmed.  Transition Care Management Follow-up Telephone Call Date of Discharge: 11/17/22 Discharge Facility: Other [facility]) Name of Other (Non[facility]) Discharge Facility: [facility] Type of Discharge: Inpatient Admission Primary Inpatient Discharge Diagnosis:: Bariatric Surgery How have you been since you were released from the hospital?: Same  Items Reviewed: Did you receive and understand the discharge instructions provided?: Yes Any new allergies since your discharge?: No Dietary orders reviewed?: Yes Type of Diet Ordered:: Per Bariatric clinic, liquid diet x 2 weeks Do you have support at home?: Yes People in Home: spouse Name of Support/Comfort Primary Source: Carla/Spouse  Medications Reviewed Today: Medications Reviewed Today     Reviewed by Heidi Dach, RN (Registered Nurse) on 11/18/22 at 1234  Med List Status: <None>   Medication Order Taking? Sig Documenting Provider Last Dose Status Informant  aprepitant (EMEND) 40 MG capsule [id number] No Take one capsule (40 mg dose) by mouth once for 1 dose. Take 1 capsule at 5am on the morning of surgery.  Patient not taking: Reported on 11/18/2022    Not Taking Active   atenolol (TENORMIN) 50 MG tablet [id number] Yes Take 1 tablet (50 mg total) by mouth daily. Claiborne Rigg, NP Taking Active   Blood Glucose Monitoring Suppl (FREESTYLE LITE) DEVI [id number] Yes Use as instructed. Check blood glucose level by fingerstick twice per day.  E11.65 Hoy Register, MD Taking Active   buPROPion (WELLBUTRIN XL) 150 MG 24 hr tablet [id number]  Take 1 tablet (150 mg total) by mouth daily. Claiborne Rigg, NP  Active   buPROPion (WELLBUTRIN XL) 300 MG 24 hr tablet [id number] Yes Take 1 tablet (300 mg total)  by mouth daily.  Taking Active   cyclobenzaprine (FLEXERIL) 10 MG tablet [id number] Yes Take 1 tablet (10 mg total) by mouth 3 (three) times daily as needed for muscle spasms. Claiborne Rigg, NP Taking Active   fenofibrate (TRICOR) 48 MG tablet [id number] Yes Take 1 tablet (48 mg total) by mouth daily. Claiborne Rigg, NP Taking Active   fluticasone [facility]) 50 MCG/ACT nasal spray [id number] Yes Place 2 sprays into both nostrils daily. Waldon Merl, PA-C Taking Active            Med Note Reinaldo Raddle, PRISCILLA V   Wed Sep 14, 2022 10:48 AM) prn  gabapentin (NEURONTIN) 300 MG capsule [id number] Yes Take 1 capsule (300 mg total) by mouth 3 (three) times daily. Claiborne Rigg, NP Taking Active   ibuprofen (ADVIL) 800 MG tablet [id number] No Take 1 tablet (800 mg total) by mouth every 8 (eight) hours as needed.  Patient not taking: Reported on 11/18/2022   Claiborne Rigg, NP Not Taking Active   levofloxacin (LEVAQUIN) 500 MG tablet [id number] No Take one tablet (500 mg dose) by mouth once for 1 dose. Take one tab the night before surgery between 9pm and 11pm  Patient not taking: Reported on 11/18/2022    Not Taking Active   levothyroxine (SYNTHROID) 175 MCG tablet [id number] Yes Take 1 tablet (175 mcg total) by mouth daily before breakfast. Claiborne Rigg, NP Taking Active   metFORMIN (GLUCOPHAGE) 850 MG tablet [id number] No Take 1 tablet (850 mg total) by mouth daily with breakfast.  Patient not taking: Reported on 11/18/2022   Claiborne Rigg,  NP Not Taking Active   metoCLOPramide (REGLAN) 10 MG tablet [id number] No Take one tablet (10 mg dose) by mouth once for 1 dose. Take 1 tablet at 5am on the morning of surgery.  Patient not taking: Reported on 11/18/2022    Not Taking Active   omega-3 acid ethyl esters (LOVAZA) 1 g capsule [id number] Yes Take 1 capsule (1 g total) by mouth daily. Claiborne Rigg, NP Taking Active   omeprazole (PRILOSEC) 20 MG capsule [id number] Yes Take 1 capsule (20 mg total)  by mouth daily. Claiborne Rigg, NP Taking Active            Med Note (Eliazer Hemphill A   Fri Nov 18, 2022 12:30 PM) Taking 40 mg as directed after surgery on 11/16/22  ondansetron (ZOFRAN-ODT) 8 MG disintegrating tablet [id number] Yes Take 1 tablet (8 mg total) by mouth every 8 (eight) hours as needed for nausea for up to 7 days.  Taking Active   Semaglutide, 2 MG/DOSE, (OZEMPIC, 2 MG/DOSE,) 8 MG/3ML SOPN [id number] No Inject 2 mg into the skin once a week.  Patient not taking: Reported on 11/18/2022    Not Taking Active   senna-docusate (SENOKOT-S) 8.6-50 MG tablet [id number] Yes Take 2 tablets by mouth 2 (two) times daily as needed for mild constipation or moderate constipation. Claiborne Rigg, NP Taking Active   ursodiol (ACTIGALL) 300 MG capsule [id number] No Take 1 capsule (300 mg total) by mouth 2 (two) times daily. Start 14 days after surgery, always take with food.  Patient not taking: Reported on 11/18/2022    Not Taking Active             Home Care and Equipment/Supplies: Were Home Health Services Ordered?: NA Any new equipment or medical supplies ordered?: NA  Functional Questionnaire: Do you need assistance with bathing/showering or dressing?: No Do you need assistance with meal preparation?: No Do you need assistance with eating?: No Do you have difficulty maintaining continence: No Do you need assistance with getting out of bed/getting out of a chair/moving?: No  Follow up appointments reviewed: PCP Follow-up appointment confirmed?: Yes Date of PCP follow-up appointment?: 12/16/22 Follow-up Provider: Bertram [place] Specialist [facility] Follow-up appointment confirmed?: Yes Date of Specialist follow-up appointment?: 11/23/22 Follow-Up Specialty Provider:: Bariatric clinic Do you need transportation to your follow-up appointment?: No Do you understand care options if your condition(s) worsen?: Yes-patient verbalized understanding  SDOH Interventions Today    Flowsheet  Row Most Recent Value  SDOH Interventions   Transportation Interventions Intervention Not Indicated       Estanislado Emms RN, BSN [place]  [facility] RN Care Coordinator [phone]

## 2022-11-21 ENCOUNTER — Telehealth (INDEPENDENT_AMBULATORY_CARE_PROVIDER_SITE_OTHER): Payer: Self-pay | Admitting: Nurse Practitioner

## 2022-11-21 NOTE — Telephone Encounter (Signed)
Please call the patient and inquire below. I do not do surgical follow ups on elective surgeries. The surgeon does their own surgical follow ups. I do not understand the reason for the CRM.

## 2022-11-21 NOTE — Telephone Encounter (Signed)
Copied from CRM [id number]. Topic: Appointment Scheduling - Scheduling Inquiry for Clinic >> Nov 18, 2022 12:07 PM Everette C wrote: Reason for CRM: The patient was directed by their surgeon's office to contact their PCP and inquire if they need to be seen sooner than 12/16/22 for a surgical follow up   Please contact the patient further when possible

## 2022-11-22 NOTE — Telephone Encounter (Signed)
Call returned and patient aware to keep October appointment.

## 2022-11-22 NOTE — Telephone Encounter (Signed)
Patient has called, returning Austin Beasley, CMA, phone call. Please advise and contact patient back @ # [phone].

## 2022-11-22 NOTE — Telephone Encounter (Signed)
Patient is unavailable to speak at the moment and will return my call.

## 2022-12-09 ENCOUNTER — Other Ambulatory Visit (HOSPITAL_BASED_OUTPATIENT_CLINIC_OR_DEPARTMENT_OTHER): Payer: Self-pay

## 2022-12-14 ENCOUNTER — Other Ambulatory Visit: Payer: Self-pay

## 2022-12-16 ENCOUNTER — Ambulatory Visit: Payer: Medicaid Other | Attending: Nurse Practitioner | Admitting: Nurse Practitioner

## 2022-12-16 ENCOUNTER — Other Ambulatory Visit: Payer: Self-pay

## 2022-12-16 ENCOUNTER — Encounter: Payer: Self-pay | Admitting: Nurse Practitioner

## 2022-12-16 VITALS — BP 114/75 | HR 56 | Ht 72.0 in | Wt 350.0 lb

## 2022-12-16 DIAGNOSIS — Z7984 Long term (current) use of oral hypoglycemic drugs: Secondary | ICD-10-CM | POA: Diagnosis not present

## 2022-12-16 DIAGNOSIS — Z23 Encounter for immunization: Secondary | ICD-10-CM

## 2022-12-16 DIAGNOSIS — E1165 Type 2 diabetes mellitus with hyperglycemia: Secondary | ICD-10-CM

## 2022-12-16 DIAGNOSIS — K2100 Gastro-esophageal reflux disease with esophagitis, without bleeding: Secondary | ICD-10-CM

## 2022-12-16 MED ORDER — OMEPRAZOLE 40 MG PO CPDR
40.0000 mg | DELAYED_RELEASE_CAPSULE | Freq: Every day | ORAL | 1 refills | Status: DC
  Filled 2022-12-16: qty 90, 90d supply, fill #0
  Filled 2023-02-27: qty 90, 90d supply, fill #1

## 2022-12-16 NOTE — Progress Notes (Addendum)
[id number]  Assessment & Plan:  Iban was seen today for medical management of chronic issues.  Diagnoses and all orders for this visit:  Type 2 diabetes mellitus with hyperglycemia, without long-term current use of insulin  Stop ozempic and metformin per bariatric clinic instructions -     Urine Albumin/Creatinine with ratio (send out) [LAB689] -     Hemoglobin A1c  Gastroesophageal reflux disease with esophagitis without hemorrhage -     omeprazole (PRILOSEC) 40 MG capsule; Take 1 capsule (40 mg total) by mouth daily. INSTRUCTIONS: Avoid GERD Triggers: acidic, spicy or fried foods, caffeine, coffee, sodas,  alcohol and chocolate.    Flu vaccine need -     Flu vaccine trivalent PF, 6mos and older(Flulaval,Afluria,Fluarix,Fluzone)    Patient has been counseled on age-appropriate routine health concerns for screening and prevention. These are reviewed and up-to-date. Referrals have been placed accordingly. Immunizations are up-to-date or declined.    Subjective:   Chief Complaint  Patient presents with   Medical Management of Chronic Issues   HPI Austin Beasley 42 y.o. male presents to office today for follow-up to diabetes.  He is status post lap sleeve gastrectomy on 11/16/2022.  Doing well today.  We will check A1c to see if any medications will need to be adjusted. He is currently taking ursodial  He is still smoking and reports this has increased slightly due to the recent passing of his sister earlier this week.   Austin Beasley states he was told by the bariatric surgeon that he needed to increase his omeprazole to 40 mg daily from 20 mg daily.  DM 2 His ozempic and metformin have been stopped by the bariatric surgeon. A1c currently at goal and diabetes is controlled.  Lab Results  Component Value Date   HGBA1C 6.0 09/14/2022    Review of Systems  Constitutional:  Negative for fever, malaise/fatigue and weight loss.  HENT: Negative.  Negative for nosebleeds.   Eyes:  Negative.  Negative for blurred vision, double vision and photophobia.  Respiratory: Negative.  Negative for cough and shortness of breath.   Cardiovascular: Negative.  Negative for chest pain, palpitations and leg swelling.  Gastrointestinal: Negative.  Negative for heartburn, nausea and vomiting.  Musculoskeletal: Negative.  Negative for myalgias.  Neurological: Negative.  Negative for dizziness, focal weakness, seizures and headaches.  Psychiatric/Behavioral: Negative.  Negative for suicidal ideas.     Past Medical History:  Diagnosis Date   Diabetes mellitus without complication (HCC)    High cholesterol    Hypertension    Obesity    Palpitations    Thyroid disease     Past Surgical History:  Procedure Laterality Date   ELEVATION OF DEPRESSED SKULL FRACTURE     age 19    Family History  Problem Relation Age of Onset   Diabetes Mother    Cancer Mother    Hypertension Father    Cancer Sister    Birth defects Maternal Grandmother    Birth defects Maternal Grandfather    Birth defects Paternal Grandmother    Birth defects Paternal Grandfather     Social History Reviewed with no changes to be made today.   Outpatient Medications Prior to Visit  Medication Sig Dispense Refill   atenolol (TENORMIN) 50 MG tablet Take 1 tablet (50 mg total) by mouth daily. 90 tablet 1   Blood Glucose Monitoring Suppl (FREESTYLE LITE) DEVI Use as instructed. Check blood glucose level by fingerstick twice per day.  E11.65 1 each 0  buPROPion (WELLBUTRIN XL) 300 MG 24 hr tablet Take 1 tablet (300 mg total) by mouth daily. 30 tablet 2   cyclobenzaprine (FLEXERIL) 10 MG tablet Take 1 tablet (10 mg total) by mouth 3 (three) times daily as needed for muscle spasms. 60 tablet 1   fenofibrate (TRICOR) 48 MG tablet Take 1 tablet (48 mg total) by mouth daily. 90 tablet 0   fluticasone (FLONASE) 50 MCG/ACT nasal spray Place 2 sprays into both nostrils daily. 16 g 0   gabapentin (NEURONTIN) 300 MG  capsule Take 1 capsule (300 mg total) by mouth 3 (three) times daily. 270 capsule 1   levothyroxine (SYNTHROID) 175 MCG tablet Take 1 tablet (175 mcg total) by mouth daily before breakfast. 90 tablet 1   omega-3 acid ethyl esters (LOVAZA) 1 g capsule Take 1 capsule (1 g total) by mouth daily. 90 capsule 1   ondansetron (ZOFRAN-ODT) 8 MG disintegrating tablet Take 1 tablet (8 mg total) by mouth every 8 (eight) hours as needed for nausea for up to 7 days. 30 tablet 1   senna-docusate (SENOKOT-S) 8.6-50 MG tablet Take 2 tablets by mouth 2 (two) times daily as needed for mild constipation or moderate constipation. 100 tablet 6   ursodiol (ACTIGALL) 300 MG capsule Take 1 capsule (300 mg total) by mouth 2 (two) times daily. Start 14 days after surgery, always take with food. 60 capsule 5   omeprazole (PRILOSEC) 20 MG capsule Take 1 capsule (20 mg total) by mouth daily. 90 capsule 2   buPROPion (WELLBUTRIN XL) 150 MG 24 hr tablet Take 1 tablet (150 mg total) by mouth daily. 90 tablet 1   aprepitant (EMEND) 40 MG capsule Take one capsule (40 mg dose) by mouth once for 1 dose. Take 1 capsule at 5am on the morning of surgery. (Patient not taking: Reported on 11/18/2022) 1 capsule 0   ibuprofen (ADVIL) 800 MG tablet Take 1 tablet (800 mg total) by mouth every 8 (eight) hours as needed. (Patient not taking: Reported on 11/18/2022) 60 tablet 1   levofloxacin (LEVAQUIN) 500 MG tablet Take one tablet (500 mg dose) by mouth once for 1 dose. Take one tab the night before surgery between 9pm and 11pm (Patient not taking: Reported on 11/18/2022) 1 tablet 0   metFORMIN (GLUCOPHAGE) 850 MG tablet Take 1 tablet (850 mg total) by mouth daily with breakfast. (Patient not taking: Reported on 11/18/2022) 90 tablet 1   metoCLOPramide (REGLAN) 10 MG tablet Take one tablet (10 mg dose) by mouth once for 1 dose. Take 1 tablet at 5am on the morning of surgery. (Patient not taking: Reported on 11/18/2022) 1 tablet 0   Semaglutide, 2  MG/DOSE, (OZEMPIC, 2 MG/DOSE,) 8 MG/3ML SOPN Inject 2 mg into the skin once a week. (Patient not taking: Reported on 11/18/2022) 9 mL 0   No facility-administered medications prior to visit.    No Known Allergies     Objective:    BP 114/75 (BP Location: Left Arm, Patient Position: Sitting, Cuff Size: Normal)   Pulse (!) 56   Ht 6' (1.829 m)   Wt (!) 350 lb (158.8 kg)   SpO2 97%   BMI 47.47 kg/m  Wt Readings from Last 3 Encounters:  12/16/22 (!) 350 lb (158.8 kg)  09/14/22 (!) 392 lb 9.6 oz (178.1 kg)  05/09/22 (!) 411 lb 6.4 oz (186.6 kg)    Physical Exam Vitals and nursing note reviewed.  Constitutional:      Appearance: He is well-developed.  HENT:  Head: Normocephalic and atraumatic.  Cardiovascular:     Rate and Rhythm: Normal rate and regular rhythm.     Heart sounds: Normal heart sounds. No murmur heard.    No friction rub. No gallop.  Pulmonary:     Effort: Pulmonary effort is normal. No tachypnea or respiratory distress.     Breath sounds: Normal breath sounds. No decreased breath sounds, wheezing, rhonchi or rales.  Chest:     Chest wall: No tenderness.  Abdominal:     General: Bowel sounds are normal.     Palpations: Abdomen is soft.  Musculoskeletal:        General: Normal range of motion.     Cervical back: Normal range of motion.  Skin:    General: Skin is warm and dry.  Neurological:     Mental Status: He is alert and oriented to person, place, and time.     Coordination: Coordination normal.  Psychiatric:        Behavior: Behavior normal. Behavior is cooperative.        Thought Content: Thought content normal.        Judgment: Judgment normal.          Patient has been counseled extensively about nutrition and exercise as well as the importance of adherence with medications and regular follow-up. The patient was given clear instructions to go to ER or return to medical center if symptoms don't improve, worsen or new problems develop. The  patient verbalized understanding.   Follow-up: Return in about 3 months (around 03/18/2023).   Claiborne Rigg, FNP-BC [facility] [place], [place] [phone]   12/16/2022, 12:07 PM

## 2022-12-17 LAB — HEMOGLOBIN A1C
Est. average glucose Bld gHb Est-mCnc: 117 mg/dL
Hgb A1c MFr Bld: 5.7 % — ABNORMAL HIGH (ref 4.8–5.6)

## 2022-12-19 ENCOUNTER — Other Ambulatory Visit: Payer: Self-pay | Admitting: Nurse Practitioner

## 2022-12-19 ENCOUNTER — Other Ambulatory Visit: Payer: Self-pay

## 2022-12-19 DIAGNOSIS — E1165 Type 2 diabetes mellitus with hyperglycemia: Secondary | ICD-10-CM

## 2022-12-19 LAB — MICROALBUMIN / CREATININE URINE RATIO
Creatinine, Urine: 434.1 mg/dL
Microalb/Creat Ratio: 6 mg/g{creat} (ref 0–29)
Microalbumin, Urine: 25.4 ug/mL

## 2022-12-20 ENCOUNTER — Other Ambulatory Visit: Payer: Self-pay | Admitting: Pharmacist

## 2022-12-20 ENCOUNTER — Other Ambulatory Visit: Payer: Self-pay

## 2022-12-20 MED ORDER — ACCU-CHEK SOFTCLIX LANCETS MISC
6 refills | Status: AC
  Filled 2022-12-20: qty 100, 100d supply, fill #0

## 2022-12-20 MED ORDER — ACCU-CHEK GUIDE VI STRP
ORAL_STRIP | 6 refills | Status: AC
  Filled 2022-12-20: qty 100, 100d supply, fill #0

## 2022-12-20 MED ORDER — ACCU-CHEK GUIDE W/DEVICE KIT
PACK | 0 refills | Status: DC
  Filled 2022-12-20: qty 1, 30d supply, fill #0

## 2022-12-20 MED ORDER — FREESTYLE LITE TEST VI STRP
ORAL_STRIP | 2 refills | Status: DC
Start: 2022-12-20 — End: 2022-12-20
  Filled 2022-12-20: qty 100, fill #0

## 2022-12-20 NOTE — Telephone Encounter (Signed)
Requested medication (s) are due for refill today:   Not sure  Requested medication (s) are on the active medication list:   No  Future visit scheduled:   Yes 03/24/2023 with Zelda   Last ordered: Test strips are not on his medication list.   Requested Prescriptions  Pending Prescriptions Disp Refills   glucose blood (FREESTYLE LITE) test strip 100 strip 2    Sig: CHECK BLOOD GLUCOSE TWICE PER DAY AS INSTRUCTED     Endocrinology: Diabetes - Testing Supplies Passed - 12/19/2022 12:07 PM      Passed - Valid encounter within last 12 months    Recent Outpatient Visits           4 days ago Type 2 diabetes mellitus with hyperglycemia, without long-term current use of insulin [facility])   [place] [facility] [place], [place] W, NP   3 months ago Type 2 diabetes mellitus with hyperglycemia, without long-term current use of insulin [facility])   [place] [facility] [place], [place] W, NP   7 months ago Type 2 diabetes mellitus with hyperglycemia, without long-term current use of insulin [facility])   [place] [facility] [place], [place] W, NP   9 months ago Type 2 diabetes mellitus with hyperglycemia, without long-term current use of insulin [facility])   [place] [facility] [place], [place] W, NP   1 year ago Type 2 diabetes mellitus with hyperglycemia, without long-term current use of insulin [facility])   [place] [facility] [place], [place] Stakes, NP       Future Appointments             In 3 months Claiborne Rigg, NP [facility]

## 2022-12-22 ENCOUNTER — Other Ambulatory Visit: Payer: Self-pay

## 2022-12-29 ENCOUNTER — Ambulatory Visit: Payer: Medicaid Other | Admitting: Podiatry

## 2023-01-02 ENCOUNTER — Ambulatory Visit (INDEPENDENT_AMBULATORY_CARE_PROVIDER_SITE_OTHER): Payer: Medicaid Other | Admitting: Podiatry

## 2023-01-02 ENCOUNTER — Encounter: Payer: Self-pay | Admitting: Podiatry

## 2023-01-02 DIAGNOSIS — B351 Tinea unguium: Secondary | ICD-10-CM

## 2023-01-02 DIAGNOSIS — E119 Type 2 diabetes mellitus without complications: Secondary | ICD-10-CM

## 2023-01-02 DIAGNOSIS — M79674 Pain in right toe(s): Secondary | ICD-10-CM

## 2023-01-02 DIAGNOSIS — M79675 Pain in left toe(s): Secondary | ICD-10-CM | POA: Diagnosis not present

## 2023-01-02 NOTE — Progress Notes (Signed)
This patient returns to my office for at risk foot care.  This patient requires this care by a professional since this patient will be at risk due to having diabetes.  Patient hs pain in his achilles tendon right foot after a day of work.  This patient is unable to cut nails himself since the patient cannot reach his nails.These nails are painful walking and wearing shoes.  This patient presents for at risk foot care today.  General Appearance  Alert, conversant and in no acute stress.  Vascular  Dorsalis pedis and posterior tibial  pulses are palpable  bilaterally.  Capillary return is within normal limits  bilaterally. Temperature is within normal limits  bilaterally.  Neurologic  Senn-Weinstein monofilament wire test within normal limits  bilaterally. Muscle power within normal limits bilaterally.  Nails Thick disfigured discolored nails with subungual debris  from hallux to fifth toes bilaterally. No evidence of bacterial infection or drainage bilaterally.  Orthopedic  No limitations of motion  feet .  No crepitus or effusions noted.  No bony pathology or digital deformities noted.  Skin  normotropic skin with no porokeratosis noted bilaterally.  No signs of infections or ulcers noted.     Onychomycosis  Pain in right toes  Pain in left toes  Consent was obtained for treatment procedures.   Mechanical debridement of nails 1-5  bilaterally performed with a nail nipper.  Filed with dremel without incident.      Return office visit   4 months                  Told patient to return for periodic foot care and evaluation due to potential at risk complications.   Helane Gunther DPM

## 2023-01-13 ENCOUNTER — Other Ambulatory Visit: Payer: Self-pay

## 2023-01-13 MED ORDER — BUPROPION HCL ER (XL) 300 MG PO TB24
300.0000 mg | ORAL_TABLET | Freq: Every morning | ORAL | 0 refills | Status: DC
  Filled 2023-01-13: qty 90, 90d supply, fill #0

## 2023-01-16 ENCOUNTER — Other Ambulatory Visit: Payer: Self-pay

## 2023-01-17 ENCOUNTER — Other Ambulatory Visit: Payer: Self-pay

## 2023-01-31 ENCOUNTER — Other Ambulatory Visit: Payer: Self-pay

## 2023-02-01 ENCOUNTER — Other Ambulatory Visit: Payer: Self-pay

## 2023-02-05 ENCOUNTER — Encounter: Payer: Self-pay | Admitting: Nurse Practitioner

## 2023-02-27 ENCOUNTER — Other Ambulatory Visit: Payer: Self-pay

## 2023-02-27 ENCOUNTER — Other Ambulatory Visit: Payer: Self-pay | Admitting: Nurse Practitioner

## 2023-02-27 DIAGNOSIS — E785 Hyperlipidemia, unspecified: Secondary | ICD-10-CM

## 2023-02-27 MED ORDER — FENOFIBRATE 48 MG PO TABS
48.0000 mg | ORAL_TABLET | Freq: Every day | ORAL | 0 refills | Status: DC
  Filled 2023-02-27: qty 90, 90d supply, fill #0

## 2023-03-07 ENCOUNTER — Other Ambulatory Visit: Payer: Self-pay

## 2023-03-07 ENCOUNTER — Other Ambulatory Visit (HOSPITAL_COMMUNITY): Payer: Self-pay

## 2023-03-16 ENCOUNTER — Other Ambulatory Visit: Payer: Self-pay

## 2023-03-24 ENCOUNTER — Other Ambulatory Visit: Payer: Self-pay

## 2023-03-24 ENCOUNTER — Other Ambulatory Visit (HOSPITAL_COMMUNITY): Payer: Self-pay

## 2023-03-24 ENCOUNTER — Ambulatory Visit: Payer: Medicaid Other | Attending: Nurse Practitioner | Admitting: Nurse Practitioner

## 2023-03-24 ENCOUNTER — Encounter: Payer: Self-pay | Admitting: Nurse Practitioner

## 2023-03-24 VITALS — BP 126/77 | HR 56 | Resp 20 | Ht 72.0 in | Wt 312.4 lb

## 2023-03-24 DIAGNOSIS — M25512 Pain in left shoulder: Secondary | ICD-10-CM

## 2023-03-24 DIAGNOSIS — K5909 Other constipation: Secondary | ICD-10-CM

## 2023-03-24 DIAGNOSIS — E785 Hyperlipidemia, unspecified: Secondary | ICD-10-CM

## 2023-03-24 DIAGNOSIS — K2100 Gastro-esophageal reflux disease with esophagitis, without bleeding: Secondary | ICD-10-CM

## 2023-03-24 DIAGNOSIS — Z809 Family history of malignant neoplasm, unspecified: Secondary | ICD-10-CM

## 2023-03-24 DIAGNOSIS — E039 Hypothyroidism, unspecified: Secondary | ICD-10-CM | POA: Diagnosis not present

## 2023-03-24 DIAGNOSIS — R002 Palpitations: Secondary | ICD-10-CM

## 2023-03-24 DIAGNOSIS — M25551 Pain in right hip: Secondary | ICD-10-CM | POA: Diagnosis not present

## 2023-03-24 DIAGNOSIS — D649 Anemia, unspecified: Secondary | ICD-10-CM

## 2023-03-24 DIAGNOSIS — F17210 Nicotine dependence, cigarettes, uncomplicated: Secondary | ICD-10-CM

## 2023-03-24 DIAGNOSIS — R7989 Other specified abnormal findings of blood chemistry: Secondary | ICD-10-CM

## 2023-03-24 DIAGNOSIS — E1140 Type 2 diabetes mellitus with diabetic neuropathy, unspecified: Secondary | ICD-10-CM

## 2023-03-24 DIAGNOSIS — G8929 Other chronic pain: Secondary | ICD-10-CM

## 2023-03-24 DIAGNOSIS — F17200 Nicotine dependence, unspecified, uncomplicated: Secondary | ICD-10-CM

## 2023-03-24 DIAGNOSIS — K649 Unspecified hemorrhoids: Secondary | ICD-10-CM

## 2023-03-24 MED ORDER — OMEGA-3-ACID ETHYL ESTERS 1 G PO CAPS
1.0000 g | ORAL_CAPSULE | Freq: Every day | ORAL | 1 refills | Status: DC
  Filled 2023-03-24: qty 30, 30d supply, fill #0

## 2023-03-24 MED ORDER — SENNOSIDES-DOCUSATE SODIUM 8.6-50 MG PO TABS
2.0000 | ORAL_TABLET | Freq: Two times a day (BID) | ORAL | 6 refills | Status: AC | PRN
  Filled 2023-03-24: qty 100, 25d supply, fill #0

## 2023-03-24 MED ORDER — FENOFIBRATE 48 MG PO TABS
48.0000 mg | ORAL_TABLET | Freq: Every day | ORAL | 0 refills | Status: DC
  Filled 2023-03-24 – 2023-06-11 (×2): qty 90, 90d supply, fill #0

## 2023-03-24 MED ORDER — ATENOLOL 50 MG PO TABS
50.0000 mg | ORAL_TABLET | Freq: Every day | ORAL | 1 refills | Status: DC
  Filled 2023-03-24 – 2023-05-30 (×2): qty 90, 90d supply, fill #0
  Filled 2023-09-03: qty 90, 90d supply, fill #1

## 2023-03-24 MED ORDER — OMEPRAZOLE 40 MG PO CPDR
40.0000 mg | DELAYED_RELEASE_CAPSULE | Freq: Every day | ORAL | 1 refills | Status: DC
  Filled 2023-03-24 – 2023-05-30 (×2): qty 90, 90d supply, fill #0
  Filled 2023-09-03: qty 90, 90d supply, fill #1

## 2023-03-24 MED ORDER — ACCU-CHEK GUIDE W/DEVICE KIT
PACK | 0 refills | Status: AC
  Filled 2023-03-24: qty 1, 30d supply, fill #0

## 2023-03-24 MED ORDER — BUPROPION HCL ER (XL) 300 MG PO TB24
300.0000 mg | ORAL_TABLET | Freq: Every morning | ORAL | 0 refills | Status: DC
  Filled 2023-03-24: qty 30, 30d supply, fill #0
  Filled 2023-03-31 – 2023-04-20 (×2): qty 90, 90d supply, fill #0

## 2023-03-24 MED ORDER — LEVOTHYROXINE SODIUM 175 MCG PO TABS
175.0000 ug | ORAL_TABLET | Freq: Every day | ORAL | 1 refills | Status: DC
  Filled 2023-03-24 – 2023-05-30 (×2): qty 90, 90d supply, fill #0
  Filled 2023-09-03: qty 90, 90d supply, fill #1

## 2023-03-24 MED ORDER — DICLOFENAC SODIUM 1 % EX GEL
4.0000 g | Freq: Four times a day (QID) | CUTANEOUS | 3 refills | Status: AC
  Filled 2023-03-24: qty 100, 14d supply, fill #0

## 2023-03-24 MED ORDER — HYDROCORTISONE (PERIANAL) 2.5 % EX CREA
1.0000 | TOPICAL_CREAM | Freq: Two times a day (BID) | CUTANEOUS | 3 refills | Status: AC
  Filled 2023-03-24 (×3): qty 30, 10d supply, fill #0
  Filled 2023-05-30: qty 30, 10d supply, fill #1
  Filled 2023-07-01: qty 30, 10d supply, fill #2
  Filled 2023-07-04: qty 30, 10d supply, fill #3

## 2023-03-24 NOTE — Progress Notes (Signed)
Assessment & Plan:  Austin Beasley was seen today for medical management of chronic issues.  Diagnoses and all orders for this visit:  Hypothyroidism, unspecified type -     Thyroid Panel With TSH -     levothyroxine (SYNTHROID) 175 MCG tablet; Take 1 tablet (175 mcg total) by mouth daily before breakfast.  Chronic right hip pain -     DG Hip Unilat W OR W/O Pelvis 2-3 Views Right; Future -     diclofenac Sodium (VOLTAREN) 1 % GEL; Apply 4 g topically 4 (four) times daily. Apply to joints  Acute pain of left shoulder -     DG Shoulder Left; Future -     diclofenac Sodium (VOLTAREN) 1 % GEL; Apply 4 g topically 4 (four) times daily. Apply to joints  Low testosterone in male -     Testosterone  Anemia, unspecified type Requesting referral to GI for early colonoscopy.  He is not aware of any family history of colon cancer but does have a history of cancer in his family.  I have instructed him that this will likely not qualify him for a colonoscopy however he would still like the referral to be placed. -     Ambulatory referral to Gastroenterology -     CBC with Differential  Intermittent palpitations Well-controlled. -     atenolol (TENORMIN) 50 MG tablet; Take 1 tablet (50 mg total) by mouth daily.  Dyslipidemia, goal LDL below 70 -     fenofibrate (TRICOR) 48 MG tablet; Take 1 tablet (48 mg total) by mouth daily. -     omega-3 acid ethyl esters (LOVAZA) 1 g capsule; Take 1 capsule (1 g total) by mouth daily.  Gastroesophageal reflux disease with esophagitis without hemorrhage -     omeprazole (PRILOSEC) 40 MG capsule; Take 1 capsule (40 mg total) by mouth daily. INSTRUCTIONS: Avoid GERD Triggers: acidic, spicy or fried foods, caffeine, coffee, sodas,  alcohol and chocolate.    Chronic constipation -     senna-docusate (SENOKOT-S) 8.6-50 MG tablet; Take 2 tablets by mouth 2 (two) times daily as needed for mild constipation or moderate constipation.  Type 2 diabetes mellitus with  diabetic neuropathy, without long-term current use of insulin (HCC) -     Blood Glucose Monitoring Suppl (ACCU-CHEK GUIDE) w/Device KIT; Use to check blood sugar 1 times daily.  Tobacco dependence -     buPROPion (WELLBUTRIN XL) 300 MG 24 hr tablet; Take 1 tablet (300 mg total) by mouth every morning.  Hemorrhoids, unspecified hemorrhoid type -     hydrocortisone (ANUSOL-HC) 2.5 % rectal cream; Place 1 Application rectally 2 (two) times daily.    Patient has been counseled on age-appropriate routine health concerns for screening and prevention. These are reviewed and up-to-date. Referrals have been placed accordingly. Immunizations are up-to-date or declined.    Subjective:   Chief Complaint  Patient presents with   Medical Management of Chronic Issues    Austin Beasley 43 y.o. male presents to office today with complaints of hemorrhoids, right hip pain, and left shoulder pain.  He is status post laparoscopy sleeve gastrectomy (3 mths ago)  Hemorrhoids Using preparation H with no relief. Likely due to prolonged sitting. Preparation H not helping.  He does have a history of constipation however he reports taking Senokot has completely resolved his symptoms and things. Has been experiencing Left shoulder pain over a month ago. Unrelated to any injury or trauma. He does not believe he favors 1  side or the other with sleeping at night. He also has right hip pain which has been present (for a long time).     Hypothyroidism He is currently taking levothyroxine 175 mcg daily.  This dose will likely need to be adjusted as his weight decreases.  He currently denies any symptoms of hypo or hyperthyroidism.  He does take Tenormin for palpitations. Lab Results  Component Value Date   TSH 2.430 03/24/2023   T4TOTAL 12.2 (H) 03/24/2023      Review of Systems  Constitutional:  Negative for fever, malaise/fatigue and weight loss.  HENT: Negative.  Negative for nosebleeds.   Eyes: Negative.   Negative for blurred vision, double vision and photophobia.  Respiratory: Negative.  Negative for cough and shortness of breath.   Cardiovascular: Negative.  Negative for chest pain, palpitations and leg swelling.  Gastrointestinal: Negative.  Negative for heartburn, nausea and vomiting.       See HPI  Musculoskeletal:  Positive for back pain and joint pain. Negative for myalgias and neck pain.  Neurological: Negative.  Negative for dizziness, focal weakness, seizures and headaches.  Psychiatric/Behavioral: Negative.  Negative for suicidal ideas.     Past Medical History:  Diagnosis Date   Diabetes mellitus without complication (HCC)    High cholesterol    Hypertension    Obesity    Palpitations    Thyroid disease     Past Surgical History:  Procedure Laterality Date   ELEVATION OF DEPRESSED SKULL FRACTURE     age 87    Family History  Problem Relation Age of Onset   Diabetes Mother    Cancer Mother    Hypertension Father    Cancer Sister    Birth defects Maternal Grandmother    Birth defects Maternal Grandfather    Birth defects Paternal Grandmother    Birth defects Paternal Grandfather     Social History Reviewed with no changes to be made today.   Outpatient Medications Prior to Visit  Medication Sig Dispense Refill   Accu-Chek Softclix Lancets lancets Use to check blood sugar 1 times daily. 100 each 6   cyclobenzaprine (FLEXERIL) 10 MG tablet Take 1 tablet (10 mg total) by mouth 3 (three) times daily as needed for muscle spasms. 60 tablet 1   fluticasone (FLONASE) 50 MCG/ACT nasal spray Place 2 sprays into both nostrils daily. 16 g 0   gabapentin (NEURONTIN) 300 MG capsule Take 1 capsule (300 mg total) by mouth 3 (three) times daily. 270 capsule 1   glucose blood (ACCU-CHEK GUIDE) test strip Use to check blood sugar 1 times daily. 100 each 6   ursodiol (ACTIGALL) 300 MG capsule Take 1 capsule (300 mg total) by mouth 2 (two) times daily. Start 14 days after surgery,  always take with food. 60 capsule 5   atenolol (TENORMIN) 50 MG tablet Take 1 tablet (50 mg total) by mouth daily. 90 tablet 1   Blood Glucose Monitoring Suppl (ACCU-CHEK GUIDE) w/Device KIT Use to check blood sugar 1 times daily. 1 kit 0   buPROPion (WELLBUTRIN XL) 300 MG 24 hr tablet Take 1 tablet (300 mg total) by mouth every morning. 90 tablet 0   fenofibrate (TRICOR) 48 MG tablet Take 1 tablet (48 mg total) by mouth daily. 90 tablet 0   levothyroxine (SYNTHROID) 175 MCG tablet Take 1 tablet (175 mcg total) by mouth daily before breakfast. 90 tablet 1   omega-3 acid ethyl esters (LOVAZA) 1 g capsule Take 1 capsule (1 g total)  by mouth daily. 90 capsule 1   omeprazole (PRILOSEC) 40 MG capsule Take 1 capsule (40 mg total) by mouth daily. 90 capsule 1   senna-docusate (SENOKOT-S) 8.6-50 MG tablet Take 2 tablets by mouth 2 (two) times daily as needed for mild constipation or moderate constipation. 100 tablet 6   ondansetron (ZOFRAN-ODT) 8 MG disintegrating tablet Take 1 tablet (8 mg total) by mouth every 8 (eight) hours as needed for nausea for up to 7 days. (Patient not taking: Reported on 03/24/2023) 30 tablet 1   buPROPion (WELLBUTRIN XL) 150 MG 24 hr tablet Take 1 tablet (150 mg total) by mouth daily. (Patient not taking: Reported on 03/24/2023) 90 tablet 1   No facility-administered medications prior to visit.    No Known Allergies     Objective:    BP 126/77 (BP Location: Right Arm, Patient Position: Sitting, Cuff Size: Large)   Pulse (!) 56   Resp 20   Ht 6' (1.829 m)   Wt (!) 312 lb 6.4 oz (141.7 kg)   SpO2 98%   BMI 42.37 kg/m  Wt Readings from Last 3 Encounters:  03/24/23 (!) 312 lb 6.4 oz (141.7 kg)  12/16/22 (!) 350 lb (158.8 kg)  09/14/22 (!) 392 lb 9.6 oz (178.1 kg)    Physical Exam Vitals and nursing note reviewed.  Constitutional:      Appearance: He is well-developed.  HENT:     Head: Normocephalic and atraumatic.  Cardiovascular:     Rate and Rhythm: Regular  rhythm. Bradycardia present.     Heart sounds: Normal heart sounds. No murmur heard.    No friction rub. No gallop.     Comments: asymptomatic Pulmonary:     Effort: Pulmonary effort is normal. No tachypnea or respiratory distress.     Breath sounds: Normal breath sounds. No decreased breath sounds, wheezing, rhonchi or rales.  Chest:     Chest wall: No tenderness.  Abdominal:     General: Bowel sounds are normal.     Palpations: Abdomen is soft.  Musculoskeletal:        General: Normal range of motion.     Cervical back: Normal range of motion.  Skin:    General: Skin is warm and dry.  Neurological:     Mental Status: He is alert and oriented to person, place, and time.     Coordination: Coordination normal.  Psychiatric:        Behavior: Behavior normal. Behavior is cooperative.        Thought Content: Thought content normal.        Judgment: Judgment normal.          Patient has been counseled extensively about nutrition and exercise as well as the importance of adherence with medications and regular follow-up. The patient was given clear instructions to go to ER or return to medical center if symptoms don't improve, worsen or new problems develop. The patient verbalized understanding.   Follow-up: Return in about 3 months (around 06/22/2023).   Claiborne Rigg, FNP-BC [facility] [place], [place] [phone]   03/31/2023, 12:06 PM

## 2023-03-25 LAB — CBC WITH DIFFERENTIAL/PLATELET
Basophils Absolute: 0.1 10*3/uL (ref 0.0–0.2)
Basos: 1 %
EOS (ABSOLUTE): 0.3 10*3/uL (ref 0.0–0.4)
Eos: 5 %
Hematocrit: 42.5 % (ref 37.5–51.0)
Hemoglobin: 13.9 g/dL (ref 13.0–17.7)
Immature Grans (Abs): 0 10*3/uL (ref 0.0–0.1)
Immature Granulocytes: 0 %
Lymphocytes Absolute: 1.9 10*3/uL (ref 0.7–3.1)
Lymphs: 33 %
MCH: 32 pg (ref 26.6–33.0)
MCHC: 32.7 g/dL (ref 31.5–35.7)
MCV: 98 fL — ABNORMAL HIGH (ref 79–97)
Monocytes Absolute: 0.6 10*3/uL (ref 0.1–0.9)
Monocytes: 10 %
Neutrophils Absolute: 2.9 10*3/uL (ref 1.4–7.0)
Neutrophils: 51 %
Platelets: 279 10*3/uL (ref 150–450)
RBC: 4.34 x10E6/uL (ref 4.14–5.80)
RDW: 12.1 % (ref 11.6–15.4)
WBC: 5.7 10*3/uL (ref 3.4–10.8)

## 2023-03-25 LAB — THYROID PANEL WITH TSH
Free Thyroxine Index: 3.8 (ref 1.2–4.9)
T3 Uptake Ratio: 31 % (ref 24–39)
T4, Total: 12.2 ug/dL — ABNORMAL HIGH (ref 4.5–12.0)
TSH: 2.43 u[IU]/mL (ref 0.450–4.500)

## 2023-03-25 LAB — TESTOSTERONE: Testosterone: 535 ng/dL (ref 264–916)

## 2023-03-28 ENCOUNTER — Other Ambulatory Visit: Payer: Self-pay | Admitting: Nurse Practitioner

## 2023-03-28 ENCOUNTER — Ambulatory Visit
Admission: RE | Admit: 2023-03-28 | Discharge: 2023-03-28 | Disposition: A | Discharge status: Home / Self Care | Payer: Medicaid Other | Source: Ambulatory Visit | Attending: Nurse Practitioner | Admitting: Nurse Practitioner

## 2023-03-28 DIAGNOSIS — G8929 Other chronic pain: Secondary | ICD-10-CM

## 2023-03-28 DIAGNOSIS — M25512 Pain in left shoulder: Secondary | ICD-10-CM

## 2023-03-31 ENCOUNTER — Encounter: Payer: Self-pay | Admitting: Nurse Practitioner

## 2023-03-31 ENCOUNTER — Other Ambulatory Visit: Payer: Self-pay

## 2023-04-03 ENCOUNTER — Other Ambulatory Visit: Payer: Self-pay | Admitting: Nurse Practitioner

## 2023-04-03 ENCOUNTER — Other Ambulatory Visit: Payer: Self-pay

## 2023-04-03 DIAGNOSIS — M545 Low back pain, unspecified: Secondary | ICD-10-CM

## 2023-04-03 MED ORDER — CYCLOBENZAPRINE HCL 10 MG PO TABS
10.0000 mg | ORAL_TABLET | Freq: Three times a day (TID) | ORAL | 1 refills | Status: DC | PRN
  Filled 2023-04-03: qty 60, 20d supply, fill #0
  Filled 2023-05-30: qty 60, 20d supply, fill #1

## 2023-04-03 NOTE — Telephone Encounter (Signed)
Medication sent.

## 2023-04-05 ENCOUNTER — Ambulatory Visit (INDEPENDENT_AMBULATORY_CARE_PROVIDER_SITE_OTHER): Payer: Medicaid Other | Admitting: Podiatry

## 2023-04-05 ENCOUNTER — Encounter: Payer: Self-pay | Admitting: Podiatry

## 2023-04-05 DIAGNOSIS — E119 Type 2 diabetes mellitus without complications: Secondary | ICD-10-CM | POA: Diagnosis not present

## 2023-04-05 DIAGNOSIS — M79675 Pain in left toe(s): Secondary | ICD-10-CM | POA: Diagnosis not present

## 2023-04-05 DIAGNOSIS — B351 Tinea unguium: Secondary | ICD-10-CM

## 2023-04-05 DIAGNOSIS — M79674 Pain in right toe(s): Secondary | ICD-10-CM | POA: Diagnosis not present

## 2023-04-05 NOTE — Progress Notes (Signed)
This patient returns to my office for at risk foot care.  This patient requires this care by a professional since this patient will be at risk due to having diabetes.  Patient hs pain in his achilles tendon right foot after a day of work.  This patient is unable to cut nails himself since the patient cannot reach his nails.These nails are painful walking and wearing shoes.  This patient presents for at risk foot care today.  General Appearance  Alert, conversant and in no acute stress.  Vascular  Dorsalis pedis and posterior tibial  pulses are palpable  bilaterally.  Capillary return is within normal limits  bilaterally. Temperature is within normal limits  bilaterally.  Neurologic  Senn-Weinstein monofilament wire test within normal limits  bilaterally. Muscle power within normal limits bilaterally.  Nails Thick disfigured discolored nails with subungual debris  from hallux to fifth toes bilaterally. No evidence of bacterial infection or drainage bilaterally.  Orthopedic  No limitations of motion  feet .  No crepitus or effusions noted.  No bony pathology or digital deformities noted.  Skin  normotropic skin with no porokeratosis noted bilaterally.  No signs of infections or ulcers noted.     Onychomycosis  Pain in right toes  Pain in left toes  Consent was obtained for treatment procedures.   Mechanical debridement of nails 1-5  bilaterally performed with a nail nipper.  Filed with dremel without incident.      Return office visit   4 months                  Told patient to return for periodic foot care and evaluation due to potential at risk complications.   Helane Gunther DPM

## 2023-04-17 ENCOUNTER — Other Ambulatory Visit: Payer: Self-pay

## 2023-04-18 ENCOUNTER — Other Ambulatory Visit: Payer: Self-pay

## 2023-04-19 ENCOUNTER — Other Ambulatory Visit: Payer: Self-pay

## 2023-04-19 IMAGING — DX DG SHOULDER 2+V*L*
4 series · 4 of 4 positions shown · non-contrast
Comparison: None.

CLINICAL DATA: Left shoulder pain

EXAM:
LEFT SHOULDER - 2+ VIEW

[shoulder grashey]
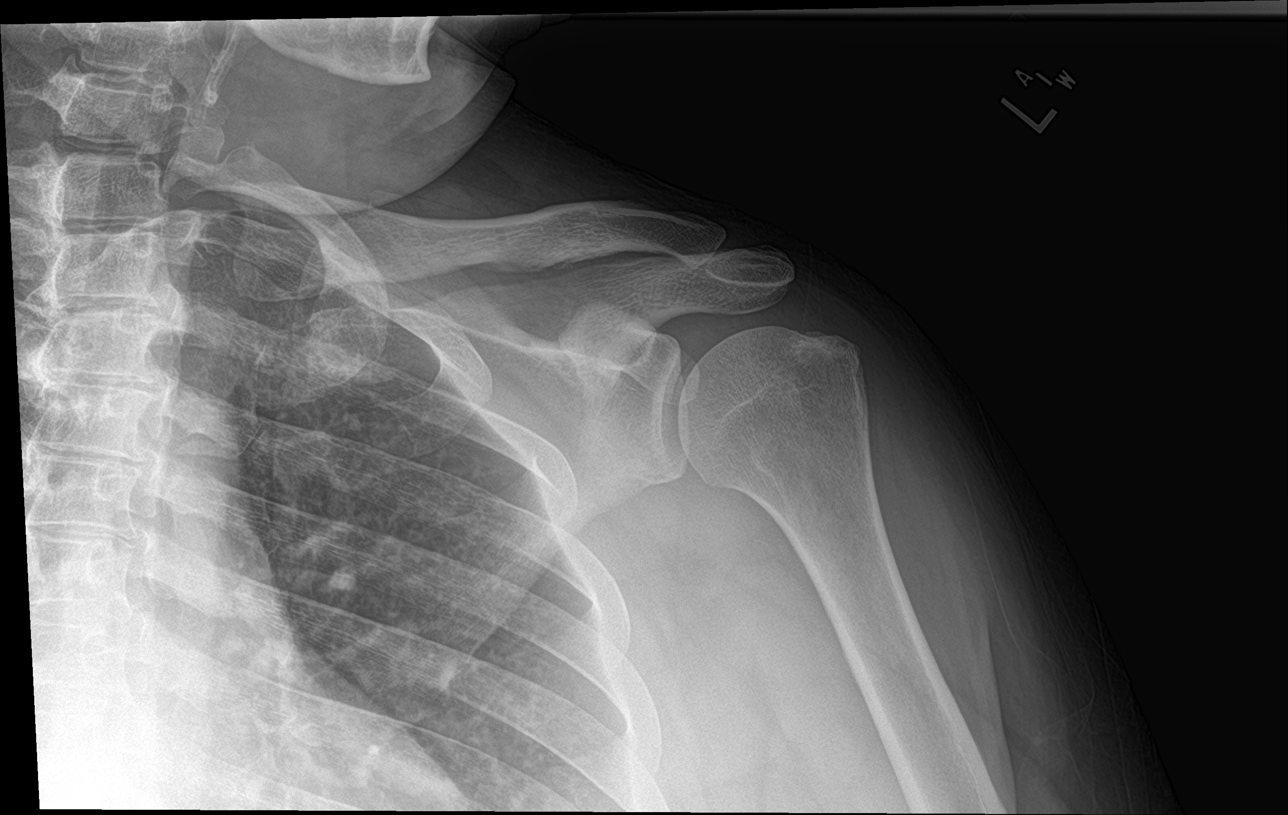

[shoulder y view]
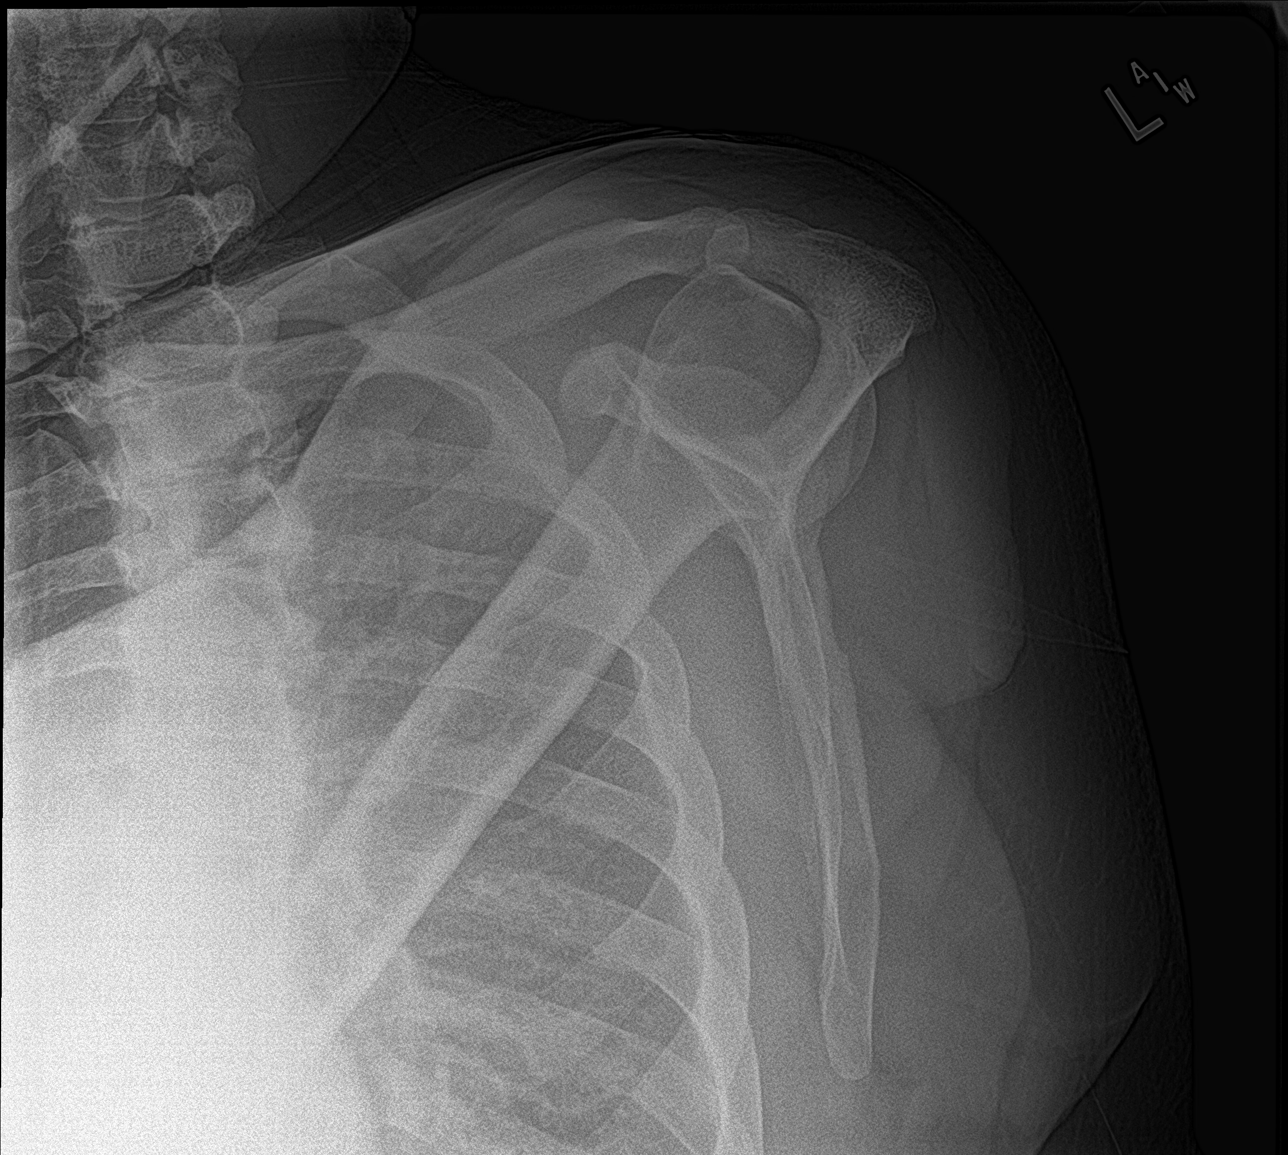

[shoulder axillary]
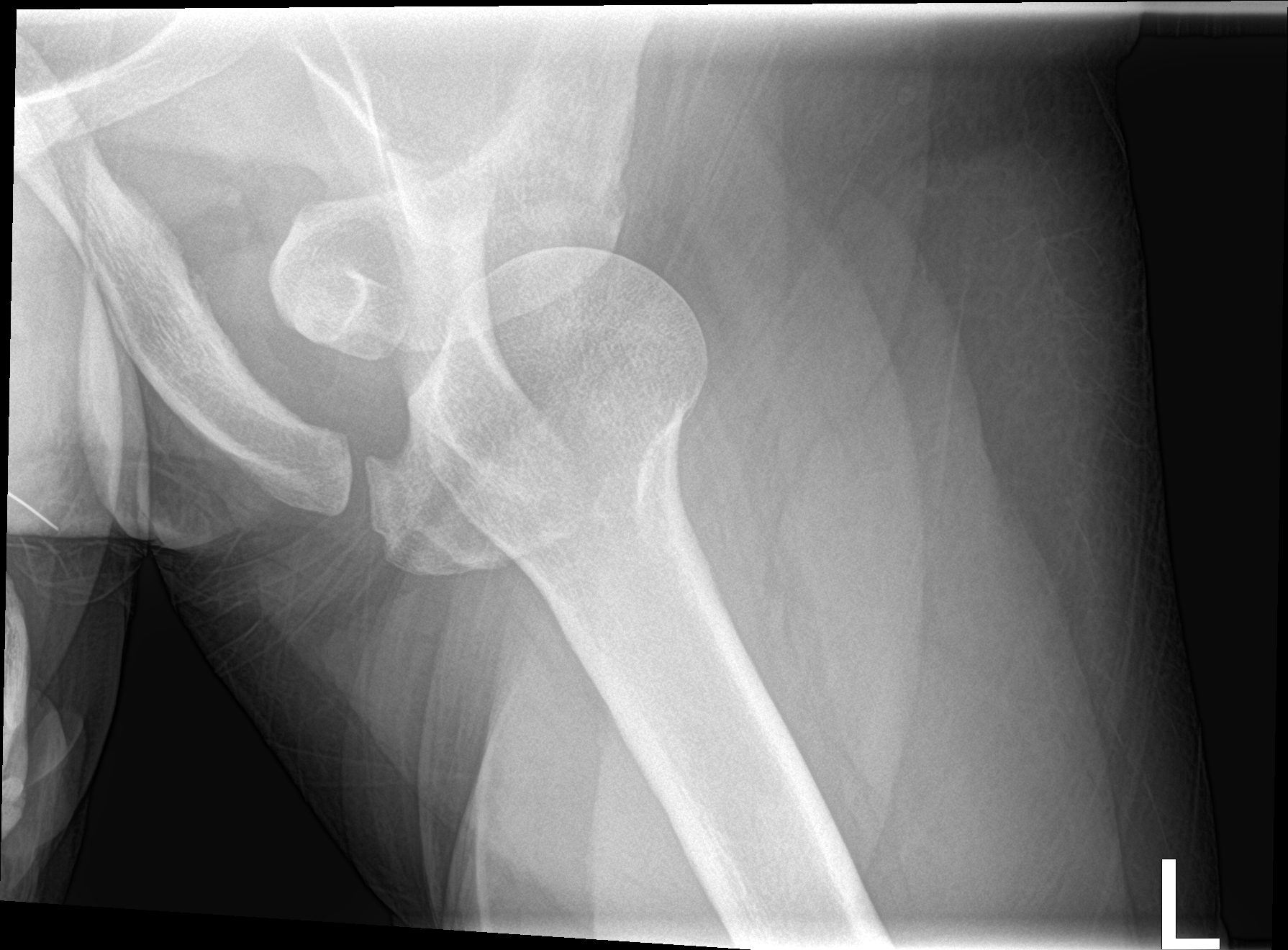

[shoulder ap neutral]
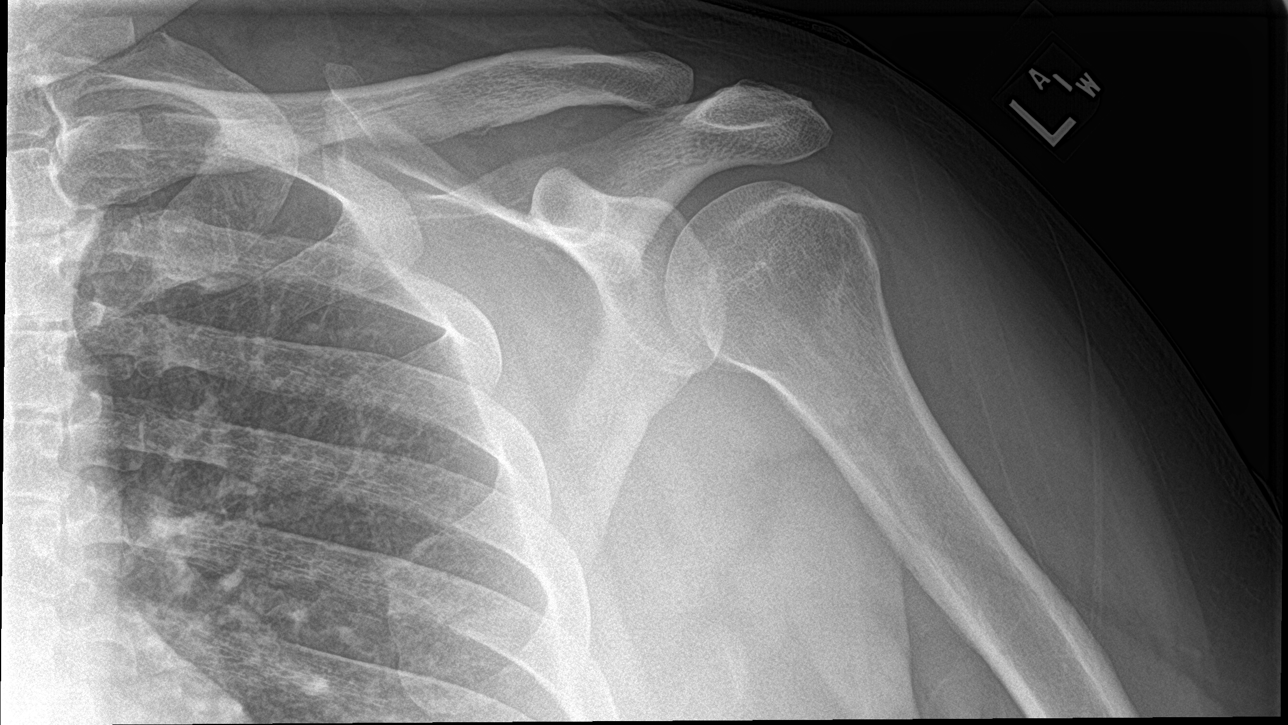

[4 of 4 positions shown; findings below may reference images not displayed]

FINDINGS: There is no evidence of fracture or dislocation. There is no
evidence of arthropathy or other focal bone abnormality. Soft
tissues are unremarkable.
IMPRESSION: Negative.

## 2023-04-20 ENCOUNTER — Other Ambulatory Visit: Payer: Self-pay

## 2023-04-21 ENCOUNTER — Other Ambulatory Visit: Payer: Self-pay

## 2023-05-01 ENCOUNTER — Other Ambulatory Visit: Payer: Self-pay

## 2023-05-02 ENCOUNTER — Other Ambulatory Visit: Payer: Self-pay

## 2023-05-30 ENCOUNTER — Other Ambulatory Visit (HOSPITAL_COMMUNITY): Payer: Self-pay

## 2023-05-30 ENCOUNTER — Other Ambulatory Visit: Payer: Self-pay | Admitting: Nurse Practitioner

## 2023-05-30 ENCOUNTER — Other Ambulatory Visit: Payer: Self-pay

## 2023-05-30 ENCOUNTER — Encounter: Payer: Self-pay | Admitting: Nurse Practitioner

## 2023-05-30 DIAGNOSIS — K649 Unspecified hemorrhoids: Secondary | ICD-10-CM

## 2023-05-31 ENCOUNTER — Other Ambulatory Visit (HOSPITAL_COMMUNITY): Payer: Self-pay

## 2023-06-01 ENCOUNTER — Other Ambulatory Visit (HOSPITAL_COMMUNITY): Payer: Self-pay

## 2023-06-01 ENCOUNTER — Other Ambulatory Visit: Payer: Self-pay

## 2023-06-11 ENCOUNTER — Other Ambulatory Visit (HOSPITAL_COMMUNITY): Payer: Self-pay

## 2023-06-11 ENCOUNTER — Other Ambulatory Visit: Payer: Self-pay | Admitting: Nurse Practitioner

## 2023-06-11 DIAGNOSIS — F17200 Nicotine dependence, unspecified, uncomplicated: Secondary | ICD-10-CM

## 2023-06-12 ENCOUNTER — Other Ambulatory Visit (HOSPITAL_COMMUNITY): Payer: Self-pay

## 2023-06-12 MED ORDER — BUPROPION HCL ER (XL) 300 MG PO TB24
300.0000 mg | ORAL_TABLET | Freq: Every morning | ORAL | 0 refills | Status: DC
  Filled 2023-06-12: qty 30, 30d supply, fill #0

## 2023-06-13 ENCOUNTER — Other Ambulatory Visit (HOSPITAL_COMMUNITY): Payer: Self-pay

## 2023-06-13 ENCOUNTER — Other Ambulatory Visit: Payer: Self-pay

## 2023-06-14 ENCOUNTER — Ambulatory Visit: Admitting: Gastroenterology

## 2023-06-14 ENCOUNTER — Encounter: Payer: Self-pay | Admitting: Gastroenterology

## 2023-06-14 ENCOUNTER — Other Ambulatory Visit: Payer: Self-pay

## 2023-06-14 ENCOUNTER — Other Ambulatory Visit (HOSPITAL_COMMUNITY): Payer: Self-pay

## 2023-06-14 VITALS — BP 130/70 | HR 59 | Ht 72.0 in | Wt 299.0 lb

## 2023-06-14 DIAGNOSIS — K921 Melena: Secondary | ICD-10-CM | POA: Diagnosis not present

## 2023-06-14 DIAGNOSIS — Z9884 Bariatric surgery status: Secondary | ICD-10-CM | POA: Diagnosis not present

## 2023-06-14 DIAGNOSIS — R194 Change in bowel habit: Secondary | ICD-10-CM | POA: Diagnosis not present

## 2023-06-14 DIAGNOSIS — F17210 Nicotine dependence, cigarettes, uncomplicated: Secondary | ICD-10-CM | POA: Diagnosis not present

## 2023-06-14 MED ORDER — NA SULFATE-K SULFATE-MG SULF 17.5-3.13-1.6 GM/177ML PO SOLN
1.0000 | Freq: Once | ORAL | 0 refills | Status: AC
Start: 2023-06-14 — End: 2023-06-28
  Filled 2023-06-14: qty 354, 1d supply, fill #0
  Filled 2023-06-27: qty 354, 2d supply, fill #0

## 2023-06-14 NOTE — Progress Notes (Signed)
 Discussed the use of AI scribe software for clinical note transcription with the patient, who gave verbal consent to proceed.  HPI : Austin Beasley is a 43 year old male who presents with rectal bleeding and discomfort.  He has been experiencing rectal bleeding, discomfort, itching, and swelling for the past few months. The bleeding is bright red and typically noticed on toilet paper a couple of times a week. These symptoms are intermittent and not necessarily associated with hard bowel movements/constipation or straining.  He describes a change in stool caliber, noting that stools have become less satisfactory, smaller in volume and pencil thin.  He has been using Senna daily and occasionally uses Miralax once or twice a week. He has tried Metamucil in the past but found the texture intolerable.  He has experienced occasional severe pain during bowel movements, likening it to 'pooping out broken glass'. This pain is intermittent and has led him to use a stool softener daily to alleviate discomfort.    He underwent gastric sleeve surgery in September, which has led to significant weight loss from 400 pounds to his current weight of 299lbs Post-surgery, he has been eating less and consuming more greens and protein. He takes omeprazole daily and has not experienced acid reflux since the surgery.  He no longer needs medications for diabetes.  He has a family history of colon cancer, with his paternal grandfather diagnosed in his fifties and his father having had polyps discovered during a colonoscopy.  He smokes and had quit previously but resumed after his sister's passing in October. He is working towards quitting again.     Past Medical History:  Diagnosis Date   Diabetes mellitus without complication (HCC)    High cholesterol    Hypertension    Obesity    Palpitations    Thyroid disease      Past Surgical History:  Procedure Laterality Date   ELEVATION OF DEPRESSED SKULL  FRACTURE     age 1   LAPAROSCOPIC GASTRIC SLEEVE RESECTION  11/16/2022   TONSILLECTOMY AND ADENOIDECTOMY     as early teenager   Family History  Problem Relation Age of Onset   Diabetes Mother    Cancer Mother    Hypertension Father    Cancer Sister    Birth defects Maternal Grandmother    Birth defects Maternal Grandfather    Birth defects Paternal Grandmother    Birth defects Paternal Grandfather    Social History   Tobacco Use   Smoking status: Some Days    Current packs/day: 0.25    Average packs/day: 0.3 packs/day for 27.0 years (6.8 ttl pk-yrs)    Types: Cigarettes   Smokeless tobacco: Never  Vaping Use   Vaping status: Former  Substance Use Topics   Alcohol use: Not Currently    Comment: occ   Drug use: Not Currently    Types: Marijuana    Comment: occ   Current Outpatient Medications  Medication Sig Dispense Refill   Accu-Chek Softclix Lancets lancets Use to check blood sugar 1 times daily. 100 each 6   atenolol (TENORMIN) 50 MG tablet Take 1 tablet (50 mg total) by mouth daily. 90 tablet 1   Blood Glucose Monitoring Suppl (ACCU-CHEK GUIDE) w/Device KIT Use to check blood sugar 1 times daily. 1 kit 0   buPROPion (WELLBUTRIN XL) 300 MG 24 hr tablet Take 1 tablet (300 mg total) by mouth every morning. 30 tablet 0   cyclobenzaprine (FLEXERIL) 10 MG tablet Take  1 tablet (10 mg total) by mouth 3 (three) times daily as needed for muscle spasms. 60 tablet 1   fenofibrate (TRICOR) 48 MG tablet Take 1 tablet (48 mg total) by mouth daily. 90 tablet 0   fluticasone (FLONASE) 50 MCG/ACT nasal spray Place 2 sprays into both nostrils daily. 16 g 0   gabapentin (NEURONTIN) 300 MG capsule Take 1 capsule (300 mg total) by mouth 3 (three) times daily. 270 capsule 1   glucose blood (ACCU-CHEK GUIDE) test strip Use to check blood sugar 1 times daily. 100 each 6   hydrocortisone (ANUSOL-HC) 2.5 % rectal cream Place 1 Application rectally 2 (two) times daily. 30 g 3   levothyroxine  (SYNTHROID) 175 MCG tablet Take 1 tablet (175 mcg total) by mouth daily before breakfast. 90 tablet 1   omega-3 acid ethyl esters (LOVAZA) 1 g capsule Take 1 capsule (1 g total) by mouth daily. 90 capsule 1   omeprazole (PRILOSEC) 40 MG capsule Take 1 capsule (40 mg total) by mouth daily. 90 capsule 1   ondansetron (ZOFRAN-ODT) 8 MG disintegrating tablet Take 1 tablet (8 mg total) by mouth every 8 (eight) hours as needed for nausea for up to 7 days. (Patient not taking: Reported on 03/24/2023) 30 tablet 1   senna-docusate (SENOKOT-S) 8.6-50 MG tablet Take 2 tablets by mouth 2 (two) times daily as needed for mild constipation or moderate constipation. 100 tablet 6   ursodiol (ACTIGALL) 300 MG capsule Take 1 capsule (300 mg total) by mouth 2 (two) times daily. Start 14 days after surgery, always take with food. 60 capsule 5   No current facility-administered medications for this visit.   No Known Allergies   Review of Systems: All systems reviewed and negative except where noted in HPI.    No results found.  Physical Exam: BP 130/70   Pulse (!) 59   Ht 6' (1.829 m)   Wt 299 lb (135.6 kg)   BMI 40.55 kg/m  Constitutional: Pleasant,well-developed, African American male in no acute distress. HEENT: Normocephalic and atraumatic. Conjunctivae are normal. No scleral icterus. Neck supple.  Cardiovascular: Normal rate, regular rhythm.  Pulmonary/chest: Effort normal and breath sounds normal. No wheezing, rales or rhonchi. Abdominal: Soft, nondistended, nontender. Bowel sounds active throughout. There are no masses palpable. No hepatomegaly. Extremities: no edema Rectal: Deferred until time of colonoscopy Neurological: Alert and oriented to person place and time. Skin: Skin is warm and dry. No rashes noted. Psychiatric: Normal mood and affect. Behavior is normal.  CBC    Component Value Date/Time   WBC 5.7 03/24/2023 1032   WBC 8.5 04/24/2017 1501   RBC 4.34 03/24/2023 1032   RBC 4.31  04/24/2017 1501   HGB 13.9 03/24/2023 1032   HCT 42.5 03/24/2023 1032   PLT 279 03/24/2023 1032   MCV 98 (H) 03/24/2023 1032   MCH 32.0 03/24/2023 1032   MCH 33.4 04/24/2017 1501   MCHC 32.7 03/24/2023 1032   MCHC 35.0 04/24/2017 1501   RDW 12.1 03/24/2023 1032   LYMPHSABS 1.9 03/24/2023 1032   MONOABS 0.6 05/28/2014 1820   EOSABS 0.3 03/24/2023 1032   BASOSABS 0.1 03/24/2023 1032    CMP     Component Value Date/Time   NA 141 12/03/2021 1006   K 4.7 12/03/2021 1006   CL 103 12/03/2021 1006   CO2 24 12/03/2021 1006   GLUCOSE 105 (H) 12/03/2021 1006   GLUCOSE 560 (HH) 04/24/2017 1501   BUN 13 12/03/2021 1006   CREATININE 1.25  12/03/2021 1006   CALCIUM 9.6 12/03/2021 1006   PROT 6.8 12/03/2021 1006   ALBUMIN 4.0 (L) 12/03/2021 1006   AST 20 12/03/2021 1006   ALT 23 12/03/2021 1006   ALKPHOS 51 12/03/2021 1006   BILITOT 0.4 12/03/2021 1006   GFRNONAA 85 01/01/2020 1409   GFRAA 99 01/01/2020 1409       Latest Ref Rng & Units 03/24/2023   10:32 AM 09/01/2021    4:42 PM 09/18/2020   11:31 AM  CBC EXTENDED  WBC 3.4 - 10.8 x10E3/uL 5.7  8.3  7.7   RBC 4.14 - 5.80 x10E6/uL 4.34  4.40  4.46   Hemoglobin 13.0 - 17.7 g/dL 40.9  81.1  91.4   HCT 37.5 - 51.0 % 42.5  39.9  41.4   Platelets 150 - 450 x10E3/uL 279  319  292   NEUT# 1.4 - 7.0 x10E3/uL 2.9  3.9    Lymph# 0.7 - 3.1 x10E3/uL 1.9  3.1        ASSESSMENT AND PLAN:  43 year old male with recent onset recurrent hematochezia, sometimes painless, sometimes with dyschezia, and recent change in bowel habits/stool consistency.  Rectal bleeding  Presents with rectal bleeding, discomfort, itching, and swelling for a few months. Bleeding is bright red, primarily on toilet paper, occurring a couple of times a week. He has had severe pain during bowel movements, described as passing broken glass, suggestive of an anal fissure, but also will have painless hematochezia. Although the bleeding is very likely to be from a benign  anorectal source such as a fissure or hemorrhoids, given his age  a colonoscopy is warranted to rule out other sources of bleeding such as polyps or masses.  - Schedule colonoscopy to evaluate for hemorrhoids, anal fissure, polyps, or other sources of bleeding. - Recommend Metamucil capsules to optimize stool consistency. - Advise continuation of Senna and Miralax on an as-needed basis. - Discuss potential use of hemorrhoid banding if symptoms persist despite conservative measures. - Advise use of Anusol cream for external hemorrhoid discomfort, with caution to avoid continuous use for more than two weeks.  Gastric sleeve surgery Underwent gastric sleeve surgery in September, contributing to changes in bowel habits and likely constipation. Reports no significant acid reflux symptoms post-surgery and is on omeprazole daily. Dietary changes post-surgery include increased intake of greens and protein. - Continue omeprazole daily. - Monitor bowel habits and adjust fiber intake as needed.  Smoking cessation Resumed smoking following a personal loss but is working towards quitting again. Encouragement and support for smoking cessation were provided. - Encourage smoking cessation efforts.   The details, risks (including bleeding, perforation, infection, missed lesions, medication reactions and possible hospitalization or surgery if complications occur), benefits, and alternatives to colonoscopy with possible biopsy and possible polypectomy were discussed with the patient and he consents to proceed.     Latacha Texeira E. Tomasa Rand, MD [facility]      Claiborne Rigg, NP

## 2023-06-14 NOTE — Patient Instructions (Addendum)
 Start Metamucil capsules two a day.  You have been scheduled for a colonoscopy. Please follow written instructions given to you at your visit today.   If you use inhalers (even only as needed), please bring them with you on the day of your procedure.  DO NOT TAKE 7 DAYS PRIOR TO TEST- Trulicity (dulaglutide) Ozempic, Wegovy (semaglutide) Mounjaro (tirzepatide) Bydureon Bcise (exanatide extended release)  DO NOT TAKE 1 DAY PRIOR TO YOUR TEST Rybelsus (semaglutide) Adlyxin (lixisenatide) Victoza (liraglutide) Byetta (exanatide) ___________________________________________________________________________  _______________________________________________________  If your blood pressure at your visit was 140/90 or greater, please contact your primary care physician to follow up on this.  _______________________________________________________  If you are age 79 or older, your body mass index should be between 23-30. Your Body mass index is 40.55 kg/m. If this is out of the aforementioned range listed, please consider follow up with your Primary Care Provider.  If you are age 74 or younger, your body mass index should be between 19-25. Your Body mass index is 40.55 kg/m. If this is out of the aformentioned range listed, please consider follow up with your Primary Care Provider.   ________________________________________________________  The [facility] providers would like to encourage you to use [facility] to communicate with providers for non-urgent requests or questions.  Due to long hold times on the telephone, sending your provider a message by [facility] may be a faster and more efficient way to get a response.  Please allow 48 business hours for a response.  Please remember that this is for non-urgent requests.  _______________________________________________________   It was a pleasure to see you today!  Thank you for trusting me with your gastrointestinal care!

## 2023-06-23 ENCOUNTER — Ambulatory Visit: Payer: Medicaid Other | Admitting: Nurse Practitioner

## 2023-06-23 ENCOUNTER — Other Ambulatory Visit: Payer: Self-pay

## 2023-06-27 ENCOUNTER — Ambulatory Visit: Attending: Nurse Practitioner | Admitting: Nurse Practitioner

## 2023-06-27 ENCOUNTER — Other Ambulatory Visit: Payer: Self-pay

## 2023-06-27 ENCOUNTER — Encounter: Payer: Self-pay | Admitting: Nurse Practitioner

## 2023-06-27 VITALS — BP 107/70 | HR 56 | Resp 18 | Ht 72.0 in | Wt 301.0 lb

## 2023-06-27 DIAGNOSIS — F17200 Nicotine dependence, unspecified, uncomplicated: Secondary | ICD-10-CM

## 2023-06-27 DIAGNOSIS — M25551 Pain in right hip: Secondary | ICD-10-CM | POA: Diagnosis not present

## 2023-06-27 DIAGNOSIS — Z23 Encounter for immunization: Secondary | ICD-10-CM | POA: Diagnosis not present

## 2023-06-27 DIAGNOSIS — E119 Type 2 diabetes mellitus without complications: Secondary | ICD-10-CM | POA: Diagnosis not present

## 2023-06-27 DIAGNOSIS — F17210 Nicotine dependence, cigarettes, uncomplicated: Secondary | ICD-10-CM

## 2023-06-27 DIAGNOSIS — G8929 Other chronic pain: Secondary | ICD-10-CM

## 2023-06-27 DIAGNOSIS — E785 Hyperlipidemia, unspecified: Secondary | ICD-10-CM

## 2023-06-27 MED ORDER — BUPROPION HCL ER (XL) 300 MG PO TB24
300.0000 mg | ORAL_TABLET | Freq: Every morning | ORAL | 1 refills | Status: DC
  Filled 2023-06-27 – 2023-07-19 (×2): qty 90, 90d supply, fill #0

## 2023-06-27 MED ORDER — FENOFIBRATE 48 MG PO TABS
48.0000 mg | ORAL_TABLET | Freq: Every day | ORAL | 1 refills | Status: DC
  Filled 2023-06-27 – 2023-09-17 (×2): qty 90, 90d supply, fill #0
  Filled 2023-12-28: qty 90, 90d supply, fill #1

## 2023-06-27 MED ORDER — ACETAMINOPHEN-CODEINE 300-30 MG PO TABS
1.0000 | ORAL_TABLET | Freq: Three times a day (TID) | ORAL | 0 refills | Status: DC | PRN
  Filled 2023-06-27: qty 30, 5d supply, fill #0

## 2023-06-27 MED ORDER — CYCLOBENZAPRINE HCL 10 MG PO TABS
10.0000 mg | ORAL_TABLET | Freq: Three times a day (TID) | ORAL | 1 refills | Status: DC | PRN
  Filled 2023-06-27: qty 60, 20d supply, fill #0

## 2023-06-27 MED ORDER — DICLOFENAC SODIUM 1 % EX GEL
4.0000 g | Freq: Four times a day (QID) | CUTANEOUS | 3 refills | Status: DC
  Filled 2023-06-27: qty 200, 28d supply, fill #0

## 2023-06-27 MED ORDER — FLUTICASONE PROPIONATE 50 MCG/ACT NA SUSP
2.0000 | Freq: Every day | NASAL | 0 refills | Status: AC
  Filled 2023-06-27: qty 16, 30d supply, fill #0

## 2023-06-27 NOTE — Addendum Note (Signed)
 Addended by: Roberts Ching on: 06/27/2023 02:23 PM   Modules accepted: Orders

## 2023-06-27 NOTE — Progress Notes (Signed)
 Assessment & Plan:  Austin Beasley was seen today for medical management of chronic issues.  Diagnoses and all orders for this visit:  Chronic right hip pain -     cyclobenzaprine  (FLEXERIL ) 10 MG tablet; Take 1 tablet (10 mg total) by mouth 3 (three) times daily as needed for muscle spasms. -     acetaminophen -codeine  (TYLENOL  #3) 300-30 MG tablet; Take 1-2 tablets by mouth every 8 (eight) hours as needed.     Ambulatory referral to Physical Medicine Rehab Continue Voltaren  gel to area as needed. Avoid taking NSAID for pain due to gastric bypass history   Diabetes mellitus without complication (HCC) -     CMP14+EGFR -     Hemoglobin A1c Recommend: DASH/Mediterranean diet. Reduce or limit carbohydrates, highly processed foods and sugar foods.  Continue to walk for exercise 3-5 times per week for at least 30 minutes. Reduce activity if pain worsens with walking.   Tobacco dependence -     buPROPion  (WELLBUTRIN  XL) 300 MG 24 hr tablet; Take 1 tablet (300 mg total) by mouth every morning. Discussed smoking cessation program. States he has quit in the past. Plans to quit smoking   Dyslipidemia, goal LDL below 70 -     fenofibrate  (TRICOR ) 48 MG tablet; Take 1 tablet (48 mg total) by mouth daily. INSTRUCTIONS: Work on a low fat, heart healthy diet and participate in regular aerobic exercise program by working out at least 150 minutes per week; 5 days a week-30 minutes per day. Avoid red meat/beef/steak,  fried foods. junk foods, sodas, sugary drinks, unhealthy snacking, alcohol and smoking.  Drink at least 80 oz of water per day and monitor your carbohydrate intake daily.    Other orders -     fluticasone  (FLONASE ) 50 MCG/ACT nasal spray; Place 2 sprays into both nostrils daily.    Patient has been counseled on age-appropriate routine health concerns for screening and prevention. These are reviewed and up-to-date. Referrals have been placed accordingly. Immunizations are up-to-date or  declined.      Subjective:   Chief Complaint  Patient presents with   Medical Management of Chronic Issues    Austin Beasley 43 y.o. male presents to office today for right hip pain. 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 Discussed wearing supportive, comfortable shoes. Taking Tylenol , cyclobenzaprine , and Voltaren  gel as needed for pain with minimal relief. States he takes breaks to sit and that help relieve pain. Denies dysuria, groin pain or weakness/falls. Does not require DME to help with ambulating. Referral sent to Physical Medicine and Rehab for chronic right hip pain, for abnormal Xray of hip and pain not relived with medication. Prescribed acetaminophen -codeine  300-30 MG tablet; Take 1-2 tablets by mouth every 8 (eight) hours as needed for pain greater than 7/10 or greater on pain scale of 0-10.     Review of Systems  Constitutional:  Positive for weight loss.       Intentional weight loss  HENT: Negative.    Eyes: Negative.   Respiratory: Negative.    Cardiovascular: Negative.   Gastrointestinal: Negative.   Genitourinary:  Negative for dysuria and flank pain.  Musculoskeletal:  Positive for joint pain. Negative for back pain and falls.  Skin: Negative.   Neurological:  Negative for dizziness and loss of consciousness.  Endo/Heme/Allergies: Negative.   Psychiatric/Behavioral: Negative.      Past Medical History:  Diagnosis  Date   Arthritis    right hip   Diabetes mellitus without complication (HCC)    High cholesterol    Hypertension    Obesity    Palpitations    Thyroid  disease     Past Surgical History:  Procedure Laterality Date   ELEVATION OF DEPRESSED SKULL FRACTURE     age 52   LAPAROSCOPIC GASTRIC SLEEVE RESECTION  11/16/2022   TONSILLECTOMY AND ADENOIDECTOMY     as early teenager    Family History  Problem Relation Age of Onset   Breast cancer Mother     Hypertension Father    Breast cancer Sister    Birth defects Maternal Grandmother    Breast cancer Maternal Grandmother    Birth defects Maternal Grandfather    Birth defects Paternal Grandmother    Birth defects Paternal Grandfather    Colon cancer Paternal Grandfather    Esophageal cancer Neg Hx     Social History Reviewed with no changes to be made today.   Outpatient Medications Prior to Visit  Medication Sig Dispense Refill   Accu-Chek Softclix Lancets lancets Use to check blood sugar 1 times daily. 100 each 6   atenolol  (TENORMIN ) 50 MG tablet Take 1 tablet (50 mg total) by mouth daily. 90 tablet 1   Blood Glucose Monitoring Suppl (ACCU-CHEK GUIDE) w/Device KIT Use to check blood sugar 1 times daily. 1 kit 0   gabapentin  (NEURONTIN ) 300 MG capsule Take 1 capsule (300 mg total) by mouth 3 (three) times daily. 270 capsule 1   glucose blood (ACCU-CHEK GUIDE) test strip Use to check blood sugar 1 times daily. 100 each 6   hydrocortisone  (ANUSOL -HC) 2.5 % rectal cream Place 1 Application rectally 2 (two) times daily. 30 g 3   levothyroxine  (SYNTHROID ) 175 MCG tablet Take 1 tablet (175 mcg total) by mouth daily before breakfast. 90 tablet 1   omeprazole  (PRILOSEC) 40 MG capsule Take 1 capsule (40 mg total) by mouth daily. 90 capsule 1   senna-docusate (SENOKOT-S) 8.6-50 MG tablet Take 2 tablets by mouth 2 (two) times daily as needed for mild constipation or moderate constipation. 100 tablet 6   buPROPion  (WELLBUTRIN  XL) 300 MG 24 hr tablet Take 1 tablet (300 mg total) by mouth every morning. 30 tablet 0   cyclobenzaprine  (FLEXERIL ) 10 MG tablet Take 1 tablet (10 mg total) by mouth 3 (three) times daily as needed for muscle spasms. 60 tablet 1   fenofibrate  (TRICOR ) 48 MG tablet Take 1 tablet (48 mg total) by mouth daily. 90 tablet 0   fluticasone  (FLONASE ) 50 MCG/ACT nasal spray Place 2 sprays into both nostrils daily. 16 g 0   No facility-administered medications prior to visit.     No Known Allergies     Objective:    BP 107/70 (BP Location: Left Arm, Patient Position: Sitting, Cuff Size: Normal)   Pulse (!) 56   Resp 18   Ht 6' (1.829 m)   Wt (!) 301 lb (136.5 kg)   SpO2 100%   BMI 40.82 kg/m  Wt Readings from Last 3 Encounters:  06/27/23 (!) 301 lb (136.5 kg)  06/14/23 299 lb (135.6 kg)  03/24/23 (!) 312 lb 6.4 oz (141.7 kg)    Physical Exam Vitals and nursing note reviewed.  Constitutional:      Appearance: Normal appearance. He is obese.  HENT:     Head: Normocephalic.  Cardiovascular:     Rate and Rhythm: Normal rate and regular rhythm.  Heart sounds: Normal heart sounds.  Musculoskeletal:        General: Normal range of motion.     Cervical back: Normal range of motion.     Left hip: Normal.  Skin:    General: Skin is warm and dry.  Neurological:     Mental Status: He is alert and oriented to person, place, and time.  Psychiatric:        Attention and Perception: Attention normal.        Mood and Affect: Mood normal.        Speech: Speech normal.        Behavior: Behavior normal. Behavior is cooperative.        Thought Content: Thought content normal.        Cognition and Memory: Cognition and memory normal.        Judgment: Judgment normal.        Patient has been counseled extensively about nutrition and exercise as well as the importance of adherence with medications and regular follow-up. The patient was given clear instructions to go to ER or return to medical center if symptoms don't improve, worsen or new problems develop. The patient verbalized understanding.   Follow-up: Return in about 3 months (around 09/26/2023).   Lashunta Frieden E Yasha Tibbett, BSN RN -student FNP [facility] [place], [place] [phone]   06/27/2023, 12:30 PM

## 2023-06-27 NOTE — Progress Notes (Signed)
 I have seen and examined this patient with the advanced practice provider STUDENT and agree with the note below

## 2023-06-28 LAB — CMP14+EGFR
ALT: 17 IU/L (ref 0–44)
AST: 22 IU/L (ref 0–40)
Albumin: 4.3 g/dL (ref 4.1–5.1)
Alkaline Phosphatase: 53 IU/L (ref 44–121)
BUN/Creatinine Ratio: 10 (ref 9–20)
BUN: 13 mg/dL (ref 6–24)
Bilirubin Total: 0.4 mg/dL (ref 0.0–1.2)
CO2: 26 mmol/L (ref 20–29)
Calcium: 10.3 mg/dL — ABNORMAL HIGH (ref 8.7–10.2)
Chloride: 105 mmol/L (ref 96–106)
Creatinine, Ser: 1.33 mg/dL — ABNORMAL HIGH (ref 0.76–1.27)
Globulin, Total: 2.6 g/dL (ref 1.5–4.5)
Glucose: 96 mg/dL (ref 70–99)
Potassium: 4.6 mmol/L (ref 3.5–5.2)
Sodium: 143 mmol/L (ref 134–144)
Total Protein: 6.9 g/dL (ref 6.0–8.5)
eGFR: 68 mL/min/{1.73_m2} (ref >59)

## 2023-06-28 LAB — HEMOGLOBIN A1C
Est. average glucose Bld gHb Est-mCnc: 108 mg/dL
Hgb A1c MFr Bld: 5.4 % (ref 4.8–5.6)

## 2023-06-29 ENCOUNTER — Encounter: Payer: Self-pay | Admitting: Nurse Practitioner

## 2023-07-03 ENCOUNTER — Other Ambulatory Visit: Payer: Self-pay

## 2023-07-04 ENCOUNTER — Other Ambulatory Visit: Payer: Self-pay

## 2023-07-04 ENCOUNTER — Encounter (HOSPITAL_COMMUNITY): Payer: Self-pay

## 2023-07-04 ENCOUNTER — Other Ambulatory Visit (HOSPITAL_COMMUNITY): Payer: Self-pay

## 2023-07-05 ENCOUNTER — Encounter: Payer: Self-pay | Admitting: Gastroenterology

## 2023-07-05 ENCOUNTER — Ambulatory Visit: Admitting: Gastroenterology

## 2023-07-05 ENCOUNTER — Ambulatory Visit: Payer: Medicaid Other | Admitting: Podiatry

## 2023-07-05 ENCOUNTER — Other Ambulatory Visit: Payer: Self-pay

## 2023-07-05 VITALS — BP 122/83 | HR 53 | Temp 98.8°F | Resp 11 | Ht 72.0 in | Wt 299.0 lb

## 2023-07-05 DIAGNOSIS — K602 Anal fissure, unspecified: Secondary | ICD-10-CM | POA: Diagnosis not present

## 2023-07-05 DIAGNOSIS — D125 Benign neoplasm of sigmoid colon: Secondary | ICD-10-CM | POA: Diagnosis not present

## 2023-07-05 DIAGNOSIS — D124 Benign neoplasm of descending colon: Secondary | ICD-10-CM | POA: Diagnosis not present

## 2023-07-05 DIAGNOSIS — D123 Benign neoplasm of transverse colon: Secondary | ICD-10-CM

## 2023-07-05 DIAGNOSIS — K921 Melena: Secondary | ICD-10-CM

## 2023-07-05 DIAGNOSIS — K635 Polyp of colon: Secondary | ICD-10-CM

## 2023-07-05 MED ORDER — SODIUM CHLORIDE 0.9 % IV SOLN: 500.0000 mL | Freq: Once | INTRAVENOUS | Status: DC

## 2023-07-05 NOTE — Progress Notes (Signed)
 History and Physical Interval Note:  07/05/2023 3:43 PM  Austin Beasley  has presented today for endoscopic procedure(s), with the diagnosis of  Encounter Diagnosis  Name Primary?   Hematochezia Yes  .  The various methods of evaluation and treatment have been discussed with the patient and/or family. After consideration of risks, benefits and other options for treatment, the patient has consented to  the endoscopic procedure(s).   The patient's history has been reviewed, patient examined, no change in status, stable for endoscopic procedure(s).  I have reviewed the patient's chart and labs.  Questions were answered to the patient's satisfaction.     Agness Sibrian E. Cherryl Corona, MD [facility]

## 2023-07-05 NOTE — Patient Instructions (Signed)

## 2023-07-05 NOTE — Progress Notes (Signed)
 Pt's states no medical or surgical changes since previsit or office visit.

## 2023-07-05 NOTE — Progress Notes (Signed)
 Sedate, gd SR, tolerated procedure well, VSS, report to RN

## 2023-07-05 NOTE — Op Note (Signed)
 [facility] Patient Name: Austin Beasley Procedure Date: 07/05/2023 3:50 PM MRN: [id number] Endoscopist: Geralyn Knee E. Cherryl Corona , MD, [id number] Age: 43 Referring MD:  Date of Birth: 1980/09/06 Gender: Male Account #: [id number] Procedure:                Colonoscopy Indications:              Hematochezia Medicines:                Monitored Anesthesia Care Procedure:                Pre-Anesthesia Assessment:                           - Prior to the procedure, a History and Physical                            was performed, and patient medications and                            allergies were reviewed. The patient's tolerance of                            previous anesthesia was also reviewed. The risks                            and benefits of the procedure and the sedation                            options and risks were discussed with the patient.                            All questions were answered, and informed consent                            was obtained. Prior Anticoagulants: The patient has                            taken no anticoagulant or antiplatelet agents. ASA                            Grade Assessment: III - A patient with severe                            systemic disease. After reviewing the risks and                            benefits, the patient was deemed in satisfactory                            condition to undergo the procedure.                           After obtaining informed consent, the colonoscope  was passed under direct vision. Throughout the                            procedure, the patient's blood pressure, pulse, and                            oxygen saturations were monitored continuously. The                            Olympus Scope SN [id number] was introduced through the                            anus and advanced to the the terminal ileum, with                            identification of the appendiceal  orifice and IC                            valve. The colonoscopy was performed without                            difficulty. The patient tolerated the procedure                            well. The quality of the bowel preparation was                            good. The terminal ileum, ileocecal valve,                            appendiceal orifice, and rectum were photographed.                            The bowel preparation used was SUPREP via split                            dose instruction. Scope In: 3:56:40 PM Scope Out: 4:22:36 PM Scope Withdrawal Time: 0 hours 20 minutes 32 seconds  Total Procedure Duration: 0 hours 25 minutes 56 seconds  Findings:                 An anal fissure was found on perianal exam.                           The digital rectal exam was normal. Pertinent                            negatives include normal sphincter tone and no                            palpable rectal lesions.                           A 3 mm polyp was found in the distal transverse  colon. The polyp was sessile. The polyp was removed                            with a cold snare. Resection and retrieval were                            complete. Estimated blood loss was minimal.                           Four sessile polyps were found in the sigmoid colon                            and descending colon. The polyps were 3 to 5 mm in                            size. These polyps were removed with a cold snare.                            Resection and retrieval were complete. Estimated                            blood loss was minimal.                           Three sessile polyps were found in the sigmoid                            colon. The polyps were 3 to 8 mm in size. These                            polyps were removed with a cold snare. Resection                            and retrieval were complete. Estimated blood loss                            was  minimal.                           The exam was otherwise normal throughout the                            examined colon.                           The terminal ileum appeared normal.                           The retroflexed view of the distal rectum and anal                            verge was normal and showed no anal or rectal  abnormalities. Complications:            No immediate complications. Estimated Blood Loss:     Estimated blood loss was minimal. Impression:               - A posterior midline anal fissure found on                            perianal exam. This is the source of the patient's                            hematochezia.                           - One 3 mm polyp in the distal transverse colon,                            removed with a cold snare. Resected and retrieved.                           - Four 3 to 5 mm polyps in the sigmoid colon and in                            the descending colon, removed with a cold snare.                            Resected and retrieved.                           - Three 3 to 8 mm polyps in the sigmoid colon,                            removed with a cold snare. Resected and retrieved.                           - The examined portion of the ileum was normal.                           - The distal rectum and anal verge are normal on                            retroflexion view. Recommendation:           - Patient has a contact number available for                            emergencies. The signs and symptoms of potential                            delayed complications were discussed with the                            patient. Return to normal activities tomorrow.  Written discharge instructions were provided to the                            patient.                           - Resume previous diet.                           - Continue present medications.                            - Await pathology results.                           - Repeat colonoscopy (date not yet determined) for                            surveillance based on pathology results.                           - Apply nitroglycerin 0.0125% ointment to anal                            canal twice daily for 6 weeks                           - Continue daily psyllium                           - Recommend nightly sitz baths                           - If fissure not healed within 6-8 weeks, can                            consider botox injection. Leylanie Woodmansee E. Cherryl Corona, MD 07/05/2023 4:33:59 PM This report has been signed electronically.

## 2023-07-05 NOTE — Progress Notes (Signed)
 Called to room to assist during endoscopic procedure.  Patient ID and intended procedure confirmed with present staff. Received instructions for my participation in the procedure from the performing physician.

## 2023-07-06 ENCOUNTER — Telehealth: Payer: Self-pay

## 2023-07-06 ENCOUNTER — Other Ambulatory Visit: Payer: Self-pay

## 2023-07-06 DIAGNOSIS — R194 Change in bowel habit: Secondary | ICD-10-CM

## 2023-07-06 MED ORDER — DILTIAZEM GEL 2 %
CUTANEOUS | 1 refills | Status: DC
  Filled 2023-07-06: qty 30, fill #0

## 2023-07-06 NOTE — Progress Notes (Signed)
 RN called patient for follow up after colonoscopy on 07/05/23. Patient states that his insurance does not cover the nitroglycerin ointment; states that he was told an alternative would be sent to his pharmacy, but it has not been sent.   RN will notify Dr. Cherryl Corona and his RN.

## 2023-07-06 NOTE — Telephone Encounter (Signed)
 RN contacted patient for follow up after colonoscopy with Dr. Cherryl Corona on 07/05/23. Patient states that his insurance does not cover the nitroglycerin ointment that Dr. Cherryl Corona ordered; he states that he was told an alternative would be provided, but it has not been sent to his pharmacy.  RN told patient she will reach out to Dr. Cherryl Corona and his RN.

## 2023-07-06 NOTE — Telephone Encounter (Signed)
 Prescription sent to pharmacy as requested.

## 2023-07-06 NOTE — Telephone Encounter (Signed)
  Follow up Call-     07/05/2023    3:15 PM  Call back number  Post procedure Call Back phone  # [phone]  Permission to leave phone message Yes     Patient questions:  Do you have a fever, pain , or abdominal swelling? No. Pain Score  0 *  Have you tolerated food without any problems? Yes.    Have you been able to return to your normal activities? Yes.    Do you have any questions about your discharge instructions: Diet   No. Medications  No. Follow up visit  No.  Do you have questions or concerns about your Care? No.  Actions: * If pain score is 4 or above: No action needed, pain <4.

## 2023-07-07 ENCOUNTER — Telehealth: Payer: Self-pay | Admitting: Gastroenterology

## 2023-07-07 ENCOUNTER — Other Ambulatory Visit: Payer: Self-pay

## 2023-07-07 MED ORDER — NITROGLYCERIN 0.4 % RE OINT: TOPICAL_OINTMENT | RECTAL | 1 refills | Status: AC

## 2023-07-07 MED ORDER — DILTIAZEM GEL 2 %: CUTANEOUS | 1 refills | Status: AC

## 2023-07-07 NOTE — Telephone Encounter (Signed)
 [facility] called and pt cannot afford the diltiazem  or the nitro. Pharmacist states his medicaid will cover  Retiv .4% (Nitro 4%) that we could send a prescription for, states the directions would be the same. [facility] to [facility] [address]. Please advise.

## 2023-07-07 NOTE — Telephone Encounter (Signed)
 Script has been sent to [facility] and they are going to call the pt regarding the prescription.

## 2023-07-07 NOTE — Telephone Encounter (Signed)
 Patient called about a compound medication that was sent over to his pharmacy. Patient stated that his pharmacy does not do compound medication. Patient is requesting a call back. Please advise.

## 2023-07-10 ENCOUNTER — Encounter: Payer: Self-pay | Admitting: Podiatry

## 2023-07-10 ENCOUNTER — Ambulatory Visit (INDEPENDENT_AMBULATORY_CARE_PROVIDER_SITE_OTHER): Admitting: Podiatry

## 2023-07-10 DIAGNOSIS — B351 Tinea unguium: Secondary | ICD-10-CM

## 2023-07-10 DIAGNOSIS — M79674 Pain in right toe(s): Secondary | ICD-10-CM

## 2023-07-10 DIAGNOSIS — M79675 Pain in left toe(s): Secondary | ICD-10-CM | POA: Diagnosis not present

## 2023-07-10 DIAGNOSIS — E119 Type 2 diabetes mellitus without complications: Secondary | ICD-10-CM

## 2023-07-10 LAB — SURGICAL PATHOLOGY

## 2023-07-10 NOTE — Progress Notes (Signed)
 This patient returns to my office for at risk foot care.  This patient requires this care by a professional since this patient will be at risk due to having diabetes.  Patient hs pain in his achilles tendon right foot after a day of work.  This patient is unable to cut nails himself since the patient cannot reach his nails.These nails are painful walking and wearing shoes.  This patient presents for at risk foot care today.  General Appearance  Alert, conversant and in no acute stress.  Vascular  Dorsalis pedis and posterior tibial  pulses are palpable  bilaterally.  Capillary return is within normal limits  bilaterally. Temperature is within normal limits  bilaterally.  Neurologic  Senn-Weinstein monofilament wire test within normal limits  bilaterally. Muscle power within normal limits bilaterally.  Nails Thick disfigured discolored nails with subungual debris  from hallux to fifth toes bilaterally. No evidence of bacterial infection or drainage bilaterally.  Orthopedic  No limitations of motion  feet .  No crepitus or effusions noted.  No bony pathology or digital deformities noted.  Skin  normotropic skin with no porokeratosis noted bilaterally.  No signs of infections or ulcers noted.     Onychomycosis  Pain in right toes  Pain in left toes  Consent was obtained for treatment procedures.   Mechanical debridement of nails 1-5  bilaterally performed with a nail nipper.  Filed with dremel without incident.      Return office visit   4 months                  Told patient to return for periodic foot care and evaluation due to potential at risk complications.   Helane Gunther DPM

## 2023-07-10 NOTE — Telephone Encounter (Signed)
 Austin Beasley has been sent to pharmacy.

## 2023-07-14 ENCOUNTER — Encounter: Payer: Self-pay | Admitting: Gastroenterology

## 2023-07-14 NOTE — Progress Notes (Signed)
 Austin Beasley,  All of the polyps removed appeared to be hyperplastic polyps.  Hyperplastic polyps are not considered precancerous.  However, one polyp had features of both a hyperplastic polyp and a sessile serrated polyp, which is considered to be precancerous.   Because of this, I would recommend a repeat colonoscopy in 7 years, instead of 10 years.

## 2023-07-19 ENCOUNTER — Other Ambulatory Visit: Payer: Self-pay

## 2023-07-20 ENCOUNTER — Other Ambulatory Visit: Payer: Self-pay

## 2023-07-26 ENCOUNTER — Encounter: Payer: Self-pay | Admitting: Physical Medicine and Rehabilitation

## 2023-09-04 ENCOUNTER — Other Ambulatory Visit: Payer: Self-pay

## 2023-09-18 ENCOUNTER — Other Ambulatory Visit (HOSPITAL_COMMUNITY): Payer: Self-pay

## 2023-09-25 ENCOUNTER — Telehealth: Payer: Self-pay | Admitting: Nurse Practitioner

## 2023-09-25 NOTE — Telephone Encounter (Signed)
Contacted pt confirmed appt

## 2023-09-26 ENCOUNTER — Ambulatory Visit: Attending: Nurse Practitioner | Admitting: Nurse Practitioner

## 2023-09-26 ENCOUNTER — Encounter: Payer: Self-pay | Admitting: Nurse Practitioner

## 2023-09-26 ENCOUNTER — Other Ambulatory Visit: Payer: Self-pay

## 2023-09-26 VITALS — BP 116/75 | HR 55 | Resp 19 | Ht 72.0 in | Wt 306.6 lb

## 2023-09-26 DIAGNOSIS — F32A Depression, unspecified: Secondary | ICD-10-CM

## 2023-09-26 DIAGNOSIS — G8929 Other chronic pain: Secondary | ICD-10-CM

## 2023-09-26 DIAGNOSIS — Z7985 Long-term (current) use of injectable non-insulin antidiabetic drugs: Secondary | ICD-10-CM

## 2023-09-26 DIAGNOSIS — M25551 Pain in right hip: Secondary | ICD-10-CM

## 2023-09-26 DIAGNOSIS — F17210 Nicotine dependence, cigarettes, uncomplicated: Secondary | ICD-10-CM

## 2023-09-26 DIAGNOSIS — E119 Type 2 diabetes mellitus without complications: Secondary | ICD-10-CM | POA: Diagnosis not present

## 2023-09-26 DIAGNOSIS — E039 Hypothyroidism, unspecified: Secondary | ICD-10-CM

## 2023-09-26 DIAGNOSIS — F17200 Nicotine dependence, unspecified, uncomplicated: Secondary | ICD-10-CM

## 2023-09-26 DIAGNOSIS — R002 Palpitations: Secondary | ICD-10-CM

## 2023-09-26 DIAGNOSIS — F418 Other specified anxiety disorders: Secondary | ICD-10-CM

## 2023-09-26 MED ORDER — ACETAMINOPHEN-CODEINE 300-30 MG PO TABS
1.0000 | ORAL_TABLET | Freq: Three times a day (TID) | ORAL | 0 refills | Status: DC | PRN
  Filled 2023-09-26: qty 30, 5d supply, fill #0
  Filled 2023-11-25: qty 30, 5d supply, fill #1

## 2023-09-26 MED ORDER — ATENOLOL 50 MG PO TABS
50.0000 mg | ORAL_TABLET | Freq: Every day | ORAL | 1 refills | Status: AC
  Filled 2023-09-26 – 2023-12-07 (×2): qty 90, 90d supply, fill #0
  Filled 2024-03-02 (×2): qty 90, 90d supply, fill #1

## 2023-09-26 MED ORDER — OZEMPIC (0.25 OR 0.5 MG/DOSE) 2 MG/3ML ~~LOC~~ SOPN
0.5000 mg | PEN_INJECTOR | SUBCUTANEOUS | 1 refills | Status: DC
  Filled 2023-09-26: qty 3, 28d supply, fill #0
  Filled 2023-10-20: qty 3, 28d supply, fill #1

## 2023-09-26 NOTE — Progress Notes (Unsigned)
 Assessment & Plan:  Frenchie was seen today for hypertension.  Diagnoses and all orders for this visit:  Diabetes mellitus without complication (HCC) -     Semaglutide ,0.25 or 0.5MG /DOS, (OZEMPIC , 0.25 OR 0.5 MG/DOSE,) 2 MG/3ML SOPN; Inject 0.5 mg into the skin once a week.  Chronic right hip pain -     acetaminophen -codeine  (TYLENOL  #3) 300-30 MG tablet; Take 1-2 tablets by mouth every 8 (eight) hours as needed.  Intermittent palpitations -     atenolol  (TENORMIN ) 50 MG tablet; Take 1 tablet (50 mg total) by mouth daily.  Hypothyroidism, unspecified type -     Thyroid  Panel With TSH    Patient has been counseled on age-appropriate routine health concerns for screening and prevention. These are reviewed and up-to-date. Referrals have been placed accordingly. Immunizations are up-to-date or declined.    Subjective:   Chief Complaint  Patient presents with   Hypertension    Austin Beasley 43 y.o. male presents to office today      ROS  Past Medical History:  Diagnosis Date   Arthritis    right hip   Diabetes mellitus without complication (HCC)    High cholesterol    Hypertension    Obesity    Palpitations    Thyroid  disease     Past Surgical History:  Procedure Laterality Date   ELEVATION OF DEPRESSED SKULL FRACTURE     age 57   LAPAROSCOPIC GASTRIC SLEEVE RESECTION  11/16/2022   TONSILLECTOMY AND ADENOIDECTOMY     as early teenager    Family History  Problem Relation Age of Onset   Breast cancer Mother    Hypertension Father    Breast cancer Sister    Birth defects Maternal Grandmother    Breast cancer Maternal Grandmother    Birth defects Maternal Grandfather    Birth defects Paternal Grandmother    Birth defects Paternal Grandfather    Colon cancer Paternal Grandfather    Esophageal cancer Neg Hx    Rectal cancer Neg Hx    Stomach cancer Neg Hx     Social History Reviewed with no changes to be made today.   Outpatient Medications Prior  to Visit  Medication Sig Dispense Refill   Accu-Chek Softclix Lancets lancets Use to check blood sugar 1 times daily. 100 each 6   Blood Glucose Monitoring Suppl (ACCU-CHEK GUIDE) w/Device KIT Use to check blood sugar 1 times daily. 1 kit 0   buPROPion  (WELLBUTRIN  XL) 300 MG 24 hr tablet Take 1 tablet (300 mg total) by mouth every morning. 90 tablet 1   Calcium Citrate-Vitamin D (CALCIUM CITRATE + PO) Take 1 tablet by mouth.     cyclobenzaprine  (FLEXERIL ) 10 MG tablet Take 1 tablet (10 mg total) by mouth 3 (three) times daily as needed for muscle spasms. 60 tablet 1   diclofenac  Sodium (VOLTAREN ) 1 % GEL Apply 4 g topically 4 (four) times daily. 200 g 3   diltiazem  2 % GEL Apply pea sized amount to anus twice daily for 6 weeks 30 g 1   fenofibrate  (TRICOR ) 48 MG tablet Take 1 tablet (48 mg total) by mouth daily. 90 tablet 1   fluticasone  (FLONASE ) 50 MCG/ACT nasal spray Place 2 sprays into both nostrils daily. 16 g 0   gabapentin  (NEURONTIN ) 300 MG capsule Take 1 capsule (300 mg total) by mouth 3 (three) times daily. 270 capsule 1   glucose blood (ACCU-CHEK GUIDE) test strip Use to check blood sugar 1 times daily.  100 each 6   hydrocortisone  (ANUSOL -HC) 2.5 % rectal cream Place 1 Application rectally 2 (two) times daily. 30 g 3   levothyroxine  (SYNTHROID ) 175 MCG tablet Take 1 tablet (175 mcg total) by mouth daily before breakfast. 90 tablet 1   Multiple Vitamin tablet Take 1 tablet by mouth daily.     mupirocin  2% oint-hydrocortisone  2.5% cream-nystatin cream-zinc oxide 13% oint 1:1:1:5 mixture SMARTSIG:Topical     Nitroglycerin  (RECTIV ) 0.4 % OINT Apply pea sized amount to rectal area twice a day for 6 weeks 30 g 1   omeprazole  (PRILOSEC) 40 MG capsule Take 1 capsule (40 mg total) by mouth daily. 90 capsule 1   senna-docusate (SENOKOT-S) 8.6-50 MG tablet Take 2 tablets by mouth 2 (two) times daily as needed for mild constipation or moderate constipation. 100 tablet 6   VITAMIN D PO Take 1  capsule by mouth.     acetaminophen -codeine  (TYLENOL  #3) 300-30 MG tablet Take 1-2 tablets by mouth every 8 (eight) hours as needed. 30 tablet 0   atenolol  (TENORMIN ) 50 MG tablet Take 1 tablet (50 mg total) by mouth daily. 90 tablet 1   No facility-administered medications prior to visit.    No Known Allergies     Objective:    BP 116/75 (BP Location: Left Arm, Patient Position: Sitting, Cuff Size: Large)   Pulse (!) 55   Resp 19   Ht 6' (1.829 m)   Wt (!) 306 lb 9.6 oz (139.1 kg)   SpO2 100%   BMI 41.58 kg/m  Wt Readings from Last 3 Encounters:  09/26/23 (!) 306 lb 9.6 oz (139.1 kg)  07/05/23 299 lb (135.6 kg)  06/27/23 (!) 301 lb (136.5 kg)    Physical Exam       Patient has been counseled extensively about nutrition and exercise as well as the importance of adherence with medications and regular follow-up. The patient was given clear instructions to go to ER or return to medical center if symptoms don't improve, worsen or new problems develop. The patient verbalized understanding.   Follow-up: Return in about 4 months (around 01/27/2024).   Haze LELON Servant, FNP-BC [facility] and [facility] [place], [place] [phone]   09/26/2023, 9:02 AM

## 2023-09-27 ENCOUNTER — Other Ambulatory Visit: Payer: Self-pay

## 2023-09-27 ENCOUNTER — Encounter: Payer: Self-pay | Admitting: Physical Medicine and Rehabilitation

## 2023-09-27 ENCOUNTER — Encounter: Attending: Physical Medicine and Rehabilitation | Admitting: Physical Medicine and Rehabilitation

## 2023-09-27 ENCOUNTER — Encounter: Payer: Self-pay | Admitting: Nurse Practitioner

## 2023-09-27 VITALS — BP 104/66 | HR 56 | Ht 72.0 in | Wt 302.0 lb

## 2023-09-27 DIAGNOSIS — M5441 Lumbago with sciatica, right side: Secondary | ICD-10-CM | POA: Diagnosis present

## 2023-09-27 DIAGNOSIS — G894 Chronic pain syndrome: Secondary | ICD-10-CM | POA: Insufficient documentation

## 2023-09-27 DIAGNOSIS — G8929 Other chronic pain: Secondary | ICD-10-CM | POA: Diagnosis present

## 2023-09-27 DIAGNOSIS — M25551 Pain in right hip: Secondary | ICD-10-CM | POA: Diagnosis not present

## 2023-09-27 MED ORDER — DICLOFENAC SODIUM 1 % EX GEL
4.0000 g | Freq: Four times a day (QID) | CUTANEOUS | 3 refills | Status: AC
  Filled 2023-09-27: qty 200, 28d supply, fill #0

## 2023-09-27 MED ORDER — CYCLOBENZAPRINE HCL 10 MG PO TABS
10.0000 mg | ORAL_TABLET | Freq: Three times a day (TID) | ORAL | 3 refills | Status: DC | PRN
  Filled 2023-09-27: qty 60, 20d supply, fill #0

## 2023-09-27 NOTE — Patient Instructions (Addendum)
 Continue Tylenol  arthritis as needed, staying below 3000 mg daily total use.  Continue Tylenol  with codeine  as an adjunctive medication; patient is currently using this once or twice per week.  He has a current prescription, but when you need a refill let our office know and we will pick this up. Next time if you need it, you will get a urine drug screen and signed a pain contract with us .  You will see us  every 2 to 3 months for this indication while you are on controlled substances.  You can continue Voltaren  gel and Flexeril   We will get you in with Dr. Carilyn for a fluoroscopy guided right hip injection.  I am sending you to physical therapy with aqua therapy to work on improving your strength and pain tolerance.  Please schedule your first appointment after you get your hip injection to maximize benefits.  Follow-up with me in 2 months.  If things are not improved at that time, we may get an MRI of your hip and recommend referral to orthopedic surgery.
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
## 2023-10-06 ENCOUNTER — Other Ambulatory Visit: Payer: Self-pay

## 2023-10-06 ENCOUNTER — Other Ambulatory Visit (HOSPITAL_COMMUNITY): Payer: Self-pay

## 2023-10-06 ENCOUNTER — Encounter: Payer: Self-pay | Admitting: Nurse Practitioner

## 2023-10-06 ENCOUNTER — Other Ambulatory Visit: Payer: Self-pay | Admitting: Nurse Practitioner

## 2023-10-06 DIAGNOSIS — F17200 Nicotine dependence, unspecified, uncomplicated: Secondary | ICD-10-CM

## 2023-10-06 DIAGNOSIS — F331 Major depressive disorder, recurrent, moderate: Secondary | ICD-10-CM

## 2023-10-06 MED ORDER — BUPROPION HCL ER (XL) 150 MG PO TB24
150.0000 mg | ORAL_TABLET | Freq: Every day | ORAL | 3 refills | Status: AC
  Filled 2023-10-06 (×2): qty 90, 90d supply, fill #0
  Filled 2023-12-28: qty 90, 90d supply, fill #1
  Filled 2024-04-05: qty 90, 90d supply, fill #2

## 2023-10-06 MED ORDER — BUPROPION HCL ER (XL) 300 MG PO TB24
300.0000 mg | ORAL_TABLET | Freq: Every morning | ORAL | 3 refills | Status: AC
  Filled 2023-10-06 (×2): qty 90, 90d supply, fill #0
  Filled 2023-12-28: qty 90, 90d supply, fill #1
  Filled 2024-01-10 – 2024-03-29 (×2): qty 90, 90d supply, fill #2

## 2023-10-09 ENCOUNTER — Other Ambulatory Visit: Payer: Self-pay

## 2023-10-16 ENCOUNTER — Ambulatory Visit: Admitting: Podiatry

## 2023-10-17 ENCOUNTER — Ambulatory Visit (INDEPENDENT_AMBULATORY_CARE_PROVIDER_SITE_OTHER): Admitting: Podiatry

## 2023-10-17 ENCOUNTER — Encounter: Payer: Self-pay | Admitting: Podiatry

## 2023-10-17 DIAGNOSIS — M79675 Pain in left toe(s): Secondary | ICD-10-CM | POA: Diagnosis not present

## 2023-10-17 DIAGNOSIS — M79674 Pain in right toe(s): Secondary | ICD-10-CM

## 2023-10-17 DIAGNOSIS — B351 Tinea unguium: Secondary | ICD-10-CM

## 2023-10-17 DIAGNOSIS — E119 Type 2 diabetes mellitus without complications: Secondary | ICD-10-CM

## 2023-10-17 NOTE — Progress Notes (Signed)
 This patient returns to my office for at risk foot care.  This patient requires this care by a professional since this patient will be at risk due to having diabetes.  Patient hs pain in his achilles tendon right foot after a day of work.  This patient is unable to cut nails himself since the patient cannot reach his nails.These nails are painful walking and wearing shoes.  This patient presents for at risk foot care today.  General Appearance  Alert, conversant and in no acute stress.  Vascular  Dorsalis pedis and posterior tibial  pulses are palpable  bilaterally.  Capillary return is within normal limits  bilaterally. Temperature is within normal limits  bilaterally.  Neurologic  Senn-Weinstein monofilament wire test within normal limits  bilaterally. Muscle power within normal limits bilaterally.  Nails Thick disfigured discolored nails with subungual debris  from hallux to fifth toes bilaterally. No evidence of bacterial infection or drainage bilaterally.  Orthopedic  No limitations of motion  feet .  No crepitus or effusions noted.  No bony pathology or digital deformities noted.  Skin  normotropic skin with no porokeratosis noted bilaterally.  No signs of infections or ulcers noted.     Onychomycosis  Pain in right toes  Pain in left toes  Consent was obtained for treatment procedures.   Mechanical debridement of nails 1-5  bilaterally performed with a nail nipper.  Filed with dremel without incident.      Return office visit   4 months                  Told patient to return for periodic foot care and evaluation due to potential at risk complications.   Helane Gunther DPM

## 2023-10-24 ENCOUNTER — Other Ambulatory Visit: Payer: Self-pay

## 2023-11-01 ENCOUNTER — Other Ambulatory Visit: Payer: Self-pay

## 2023-11-15 ENCOUNTER — Encounter: Payer: Self-pay | Admitting: Nurse Practitioner

## 2023-11-15 ENCOUNTER — Other Ambulatory Visit: Payer: Self-pay | Admitting: Nurse Practitioner

## 2023-11-15 ENCOUNTER — Other Ambulatory Visit: Payer: Self-pay

## 2023-11-15 DIAGNOSIS — E039 Hypothyroidism, unspecified: Secondary | ICD-10-CM

## 2023-11-15 MED ORDER — LEVOTHYROXINE SODIUM 150 MCG PO TABS
150.0000 ug | ORAL_TABLET | Freq: Every day | ORAL | 0 refills | Status: DC
  Filled 2023-11-15: qty 90, 90d supply, fill #0

## 2023-11-16 ENCOUNTER — Other Ambulatory Visit: Payer: Self-pay

## 2023-11-16 ENCOUNTER — Other Ambulatory Visit: Payer: Self-pay | Admitting: Nurse Practitioner

## 2023-11-16 ENCOUNTER — Other Ambulatory Visit (HOSPITAL_COMMUNITY): Payer: Self-pay

## 2023-11-16 DIAGNOSIS — E119 Type 2 diabetes mellitus without complications: Secondary | ICD-10-CM

## 2023-11-16 MED ORDER — OZEMPIC (0.25 OR 0.5 MG/DOSE) 2 MG/3ML ~~LOC~~ SOPN
0.5000 mg | PEN_INJECTOR | SUBCUTANEOUS | 1 refills | Status: DC
  Filled 2023-11-16: qty 3, 28d supply, fill #0
  Filled 2023-12-07: qty 3, 28d supply, fill #1

## 2023-11-21 ENCOUNTER — Encounter: Attending: Physical Medicine and Rehabilitation | Admitting: Physical Medicine & Rehabilitation

## 2023-11-21 ENCOUNTER — Encounter: Payer: Self-pay | Admitting: Physical Medicine & Rehabilitation

## 2023-11-21 VITALS — Ht 72.0 in | Wt 292.0 lb

## 2023-11-21 DIAGNOSIS — G8929 Other chronic pain: Secondary | ICD-10-CM | POA: Diagnosis present

## 2023-11-21 DIAGNOSIS — M25551 Pain in right hip: Secondary | ICD-10-CM | POA: Insufficient documentation

## 2023-11-21 DIAGNOSIS — M1611 Unilateral primary osteoarthritis, right hip: Secondary | ICD-10-CM | POA: Diagnosis present

## 2023-11-21 DIAGNOSIS — G894 Chronic pain syndrome: Secondary | ICD-10-CM | POA: Diagnosis present

## 2023-11-21 MED ORDER — IOHEXOL 180 MG/ML  SOLN
3.0000 mL | Freq: Once | INTRAMUSCULAR | Status: AC
  Administered 2023-11-21: 3 mL

## 2023-11-21 MED ORDER — LIDOCAINE HCL (PF) 1 % IJ SOLN
2.0000 mL | Freq: Once | INTRAMUSCULAR | Status: AC
  Administered 2023-11-21: 2 mL

## 2023-11-21 MED ORDER — LIDOCAINE HCL (PF) 2 % IJ SOLN: 4.0000 mL | Freq: Once | INTRAMUSCULAR | Status: DC

## 2023-11-21 MED ORDER — LIDOCAINE HCL 1 % IJ SOLN
10.0000 mL | Freq: Once | INTRAMUSCULAR | Status: AC
  Administered 2023-11-21: 10 mL

## 2023-11-21 MED ORDER — BETAMETHASONE SOD PHOS & ACET 6 (3-3) MG/ML IJ SUSP
9.0000 mg | Freq: Once | INTRAMUSCULAR | Status: AC
  Administered 2023-11-21: 9 mg via INTRAMUSCULAR

## 2023-11-21 NOTE — Addendum Note (Signed)
 Addended by: JAMA FLEMING T on: 11/21/2023 03:01 PM   Modules accepted: Orders

## 2023-11-21 NOTE — Progress Notes (Signed)
 hip intra-articular injection under fluoro guidance  Indication osteoarthritis unresponsive to conservative care including exercise and oral medications  Informed consent was obtained after describing risks and benefits of the procedure, this includes bleeding bruising and infection. The patient elected to proceed and has given written consent No contrast allergy BMI 40  Placed supine on fluoroscopy table.  Betadine prep to groin area.  Imaging to identify the intertrochanteric line at the base of the neck of the femur. 5 cc of 1% lidocaine  were infiltrated into the skin and subcu tissue using 25-gauge 1.5 inch needle.  Then a 22-gauge 5 needle was inserted under fluoroscopic guidance targeting the junction of the femoral head and femoral neck area.  Bone contact was made.  Omnipaque  180 times a total of 3 cc were injected needle was adjusted to achieve intra-articular location. Then a solution containing 1.5 cc of 6 mg/cc Celestone  and 4 cc of 1% lidocaine  were injected.  Patient tolerated procedure well post procedure instructions given  Numbness in the right thigh post procedure discussed that this was local anesthetic spread to femoral nerve.  Instructed patient this should improve in the next 1 to 2 hours.  Other postprocedure instructions given as well.

## 2023-11-21 NOTE — Progress Notes (Signed)
  PROCEDURE RECORD  Physical Medicine and Rehabilitation   Name: Austin Beasley. DOB:October 24, 1980 MRN: [id number]  Date:11/21/2023  Physician: Prentice Compton, MD    Nurse/CMA: Jama, CMA  Allergies: No Known Allergies  Consent Signed: Yes.    Is patient diabetic? No.  CBG today? .  Pregnant: No. LMP: No LMP for male patient. (age 44-55)  Anticoagulants: no Anti-inflammatory: no Antibiotics: no  Procedure: Right Hip Injection fluoro guided  Position: Supine Start Time: 1:14 pm  End Time: 1:24 pm  Fluoro Time: 53  RN/CMA Jama, CMA Gerrell Tabet, CMA    Time 1:00 pm 1:34 pm    BP 111/72 126/80    Pulse 62 64    Respirations 16 16    O2 Sat 96 97    S/S 6 6    Pain Level 3/10 0/10     D/C home with wife, [name] & O X 3, D/C instructions reviewed, and sits independently.

## 2023-11-25 ENCOUNTER — Other Ambulatory Visit: Payer: Self-pay | Admitting: Nurse Practitioner

## 2023-11-25 DIAGNOSIS — K2100 Gastro-esophageal reflux disease with esophagitis, without bleeding: Secondary | ICD-10-CM

## 2023-11-27 ENCOUNTER — Other Ambulatory Visit: Payer: Self-pay

## 2023-11-27 MED ORDER — OMEPRAZOLE 40 MG PO CPDR
40.0000 mg | DELAYED_RELEASE_CAPSULE | Freq: Every day | ORAL | 0 refills | Status: DC
  Filled 2023-11-27: qty 90, 90d supply, fill #0

## 2023-11-27 NOTE — Telephone Encounter (Signed)
 Requested Prescriptions  Pending Prescriptions Disp Refills   omeprazole  (PRILOSEC) 40 MG capsule 90 capsule 1    Sig: Take 1 capsule (40 mg total) by mouth daily.     Gastroenterology: Proton Pump Inhibitors Passed - 11/27/2023 11:47 AM      Passed - Valid encounter within last 12 months    Recent Outpatient Visits           2 months ago Diabetes mellitus without complication (HCC)   [facility] [place]. [facility] Theotis Haze ORN, NP   5 months ago Chronic right hip pain   [facility] [place]. [facility] Theotis Haze ORN, NP   8 months ago Hypothyroidism, unspecified type   [facility] [place]. [facility] [place] [place] W, NP   11 months ago Type 2 diabetes mellitus with hyperglycemia, without long-term current use of insulin  [facility])   [facility] Dept Of [place]. [facility] Theotis Haze W, NP   1 year ago Type 2 diabetes mellitus with hyperglycemia, without long-term current use of insulin  [facility])   [facility] Dept Of [place]. [facility] Theotis Haze ORN, NP       Future Appointments             In 2 months Theotis Haze ORN, NP [facility] - A Dept Of [place]. [facility] [place]

## 2023-11-29 ENCOUNTER — Encounter: Admitting: Physical Medicine and Rehabilitation

## 2023-11-29 ENCOUNTER — Other Ambulatory Visit: Payer: Self-pay

## 2023-11-29 VITALS — BP 111/72 | HR 66 | Ht 72.0 in | Wt 286.0 lb

## 2023-11-29 DIAGNOSIS — M25551 Pain in right hip: Secondary | ICD-10-CM

## 2023-11-29 DIAGNOSIS — G894 Chronic pain syndrome: Secondary | ICD-10-CM | POA: Diagnosis not present

## 2023-11-29 DIAGNOSIS — G8929 Other chronic pain: Secondary | ICD-10-CM

## 2023-11-29 DIAGNOSIS — M1611 Unilateral primary osteoarthritis, right hip: Secondary | ICD-10-CM

## 2023-11-29 NOTE — Progress Notes (Signed)
 Subjective:    Patient ID: Austin JULIANNA Delores Mickey., male    DOB: 09-18-1980, 43 y.o.   MRN: [id number]  HPI  Austin Beasley is a 43 y.o. year old male  who  has a past medical history of Arthritis, Diabetes mellitus without complication (HCC), High cholesterol, Hypertension, Obesity, Palpitations, and Thyroid  disease.   They are presenting to PM&R clinic for follow up related to chronic R hip pain .  Plan from last visit:  Chronic pain syndrome Chronic right hip pain -     Cyclobenzaprine  HCl; Take 1 tablet (10 mg total) by mouth 3 (three) times daily as needed for muscle spasms.  Dispense: 60 tablet; Refill: 3 -     Diclofenac  Sodium; Apply 4 g topically 4 (four) times daily.  Dispense: 200 g; Refill: 3 -     Ambulatory referral to Physical Therapy   03/2023 xray R hip: Age advanced arthropathy of the right hip with acetabular over coverage and femoral head neck osteophytes. Similar findings are seen to a lesser extent involving the left hip.   Continue Tylenol  arthritis as needed, staying below 3000 mg daily total use.   Continue Tylenol  with codeine  as an adjunctive medication; patient is currently using this once or twice per week.  He has a current prescription, but when you need a refill let our office know and we will pick this up. Next time if you need it, you will get a urine drug screen and signed a pain contract with us .  You will see us  every 2 to 3 months for this indication while you are on controlled substances.   You can continue Voltaren  gel and Flexeril    We will get you in with Dr. Carilyn for a fluoroscopy guided right hip injection.   I am sending you to physical therapy with aqua therapy to work on improving your strength and pain tolerance.  Please schedule your first appointment after you get your hip injection to maximize benefits.   Follow-up with me in 2 months.  If things are not improved at that time, we may get an MRI of your hip and recommend  referral to orthopedic surgery.     Chronic bilateral low back pain with right-sided sciatica -     Ambulatory referral to Physical Therapy   08/2021 lumbar xray: Dextrocurvature of the spine. Vertebral body heights are maintained. Mild degenerative osteophytes with relatively patent disc spaces. Mild facet degenerative changes of the lower lumbar spine   While patient does complain of wrapping hip pain, most exam maneuvers consistent with hip joint pathology.    Interval Hx:  - Therapies:  He is starting PT aquatic therapy 10/7.  Better mobility especially in the morning; difficulty in the afternoon. 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 Can go about 5 hours into his shift without extreme pain at this point.    - Follow ups: R hip injection with Dr Carilyn 9/16 - reduced pain by about 50%; if I move a certain way I still get pain in the groin and the hip   - Falls:none  - IFZ:wnwz  - Medications:  He tries to avoid medications overall - does tylenol  arthritis <300 mg daily Tylenol  with codeine   - uses maybe once every other week or so - due for a refill, partial fill available  Voltaren  - hasn't needed recently Cyclobenzaprine  - hasn't needed recently    - Other concerns: none   Pain Inventory Average  Pain 2 Pain Right Now 2 My pain is sharp  In the last 24 hours, has pain interfered with the following? General activity 0 Relation with others 0 Enjoyment of life 0 What TIME of day is your pain at its worst? daytime Sleep (in general) Good  Pain is worse with: some activites Pain improves with: medication Relief from Meds: 7  Family History  Problem Relation Age of Onset   Breast cancer Mother    Hypertension Father    Breast cancer Sister    Birth defects Maternal Grandmother    Breast cancer Maternal Grandmother    Birth defects Maternal Grandfather    Birth defects Paternal Grandmother    Birth defects Paternal Grandfather    Colon cancer  Paternal Grandfather    Esophageal cancer Neg Hx    Rectal cancer Neg Hx    Stomach cancer Neg Hx    Social History   Socioeconomic History   Marital status: Married    Spouse name: Not on file   Number of children: 1   Years of education: Not on file   Highest education level: Some college, no degree  Occupational History   Occupation: self employed  Tobacco Use   Smoking status: Some Days    Current packs/day: 0.25    Average packs/day: 0.3 packs/day for 27.0 years (6.8 ttl pk-yrs)    Types: Cigarettes   Smokeless tobacco: Never  Vaping Use   Vaping status: Former   Substances: CBD  Substance and Sexual Activity   Alcohol use: Not Currently    Comment: occ   Drug use: Not Currently    Types: Marijuana   Sexual activity: Yes  Other Topics Concern   Not on file  Social History Narrative   Not on file   Social Drivers of Health   Financial Resource Strain: Low Risk  (11/27/2023)   Received [facility]   Overall Financial Resource Strain (CARDIA)    How hard is it for you to pay for the very basics like food, housing, medical care, and heating?: Not hard at all  Recent Concern: Financial Resource Strain - Medium Risk (09/05/2023)   Received from [facility]   Overall Financial Resource Strain (CARDIA)    Difficulty of Paying Living Expenses: Somewhat hard  Food Insecurity: No Food Insecurity (11/27/2023)   Received from [facility]   Hunger Vital Sign    Within the past 12 months, you worried that your food would run out before you got the money to buy more.: Never true    Within the past 12 months, the food you bought just didn't last and you didn't have money to get more.: Never true  Recent Concern: Food Insecurity - Food Insecurity Present (09/05/2023)   Received from [facility]   Hunger Vital Sign    Within the past 12 months, you worried that your food would run out before you got the money to buy more.: Sometimes true    Within the past 12 months,  the food you bought just didn't last and you didn't have money to get more.: Patient declined  Transportation Needs: No Transportation Needs (11/27/2023)   Received from [facility] - Transportation    In the past 12 months, has lack of transportation kept you from medical appointments or from getting medications?: No    In the past 12 months, has lack of transportation kept you from meetings, work, or from getting things needed for daily living?: No  Physical Activity: Sufficiently Active (09/05/2023)   Received from [facility]   Exercise Vital Sign    On average, how many days per week do you engage in moderate to strenuous exercise (like a brisk walk)?: 2 days    On average, how many minutes do you engage in exercise at this level?: 140 min  Stress: No Stress Concern Present (09/05/2023)   Received from [facility] - Occupational Stress Questionnaire    Feeling of Stress : Not at all  Social Connections: Socially Integrated (09/05/2023)   Received from [facility]   Social Network    How would you rate your social network (family, work, friends)?: Good participation with social networks   Past Surgical History:  Procedure Laterality Date   ELEVATION OF DEPRESSED SKULL FRACTURE     age 57   LAPAROSCOPIC GASTRIC SLEEVE RESECTION  11/16/2022   TONSILLECTOMY AND ADENOIDECTOMY     as early teenager   Past Surgical History:  Procedure Laterality Date   ELEVATION OF DEPRESSED SKULL FRACTURE     age 34   LAPAROSCOPIC GASTRIC SLEEVE RESECTION  11/16/2022   TONSILLECTOMY AND ADENOIDECTOMY     as early teenager   Past Medical History:  Diagnosis Date   Arthritis    right hip   Diabetes mellitus without complication (HCC)    High cholesterol    Hypertension    Obesity    Palpitations    Thyroid  disease    BP 111/72   Pulse 66   Ht 6' (1.829 m)   Wt 286 lb (129.7 kg)   SpO2 98%   BMI 38.79 kg/m   Opioid Risk Score:    Fall Risk Score:  `1  Depression screen [facility] 2/9     09/27/2023   11:43 AM 09/26/2023    8:39 AM 06/27/2023   11:07 AM 03/24/2023    9:42 AM 12/16/2022    9:42 AM 05/09/2022   10:39 AM 03/04/2022   10:57 AM  Depression screen PHQ 2/9  Decreased Interest 0 0 0 0 0 0 0  Down, Depressed, Hopeless 0 0 0 0 0 0 0  PHQ - 2 Score 0 0 0 0 0 0 0  Altered sleeping 0 0 0 0 0  0  Tired, decreased energy 0 0 0 0 0  0  Change in appetite 0 0 0 0 0  0  Feeling bad or failure about yourself  0 0 0 0 0  0  Trouble concentrating 0 0 0 0 0  0  Moving slowly or fidgety/restless 0 0 0 0 0  0  Suicidal thoughts 0 0 0 0 0  0  PHQ-9 Score 0 0 0 0 0  0  Difficult doing work/chores  Not difficult at all Not difficult at all         Review of Systems     Objective:   Physical Exam   Constitution: Appropriate appearance for age. No apparent distress. +Obese Resp: No respiratory distress. No accessory muscle usage. on RA and CTAB Cardio: Well perfused appearance.  No peripheral edema. Abdomen: Nondistended. Nontender.   Psych: Appropriate mood and affect. Neuro: AAOx4. No apparent cognitive deficits    Neurologic Exam:   DTRs: Reflexes were 1+ in bilateral achilles, patella, biceps, BR and triceps. Babinsky: flexor responses b/l.   Hoffmans: negative b/l Sensory exam: revealed normal sensation in all dermatomal regions in bilateral upper extremities and bilateral lower extremities Motor exam:  strength 5/5 throughout bilateral upper extremities, bilateral lower extremities, and with exception of 4/5 R hip flexion limited d/t pain--unchanged Coordination: Fine motor coordination was normal.   Gait: + antalgic gait, L leaning, hip hiking; stable pattern improves with increased movement      Assessment & Plan:   Austin Beasley. is a 43 y.o. year old male  who  has a past medical history of Arthritis, Diabetes mellitus without complication (HCC), High cholesterol, Hypertension, Obesity,  Palpitations, and Thyroid  disease.   follow up related to chronic R hip pain consistent with arthritis.  Chronic pain syndrome Chronic right hip pain  At the end of this month, I will refill your prescription for Tylenol  with codeine .  Given very infrequent use, I will refill the Scripts as needed, just contact the office.  Start aqua therapy and after 6 or so sessions message me if you are not seeing significant improvements in your mobility and pain control; at that point, we would get an MRI of your hip to look into other pathology and consider orthopedic referral for surgical intervention.  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   Osteoarthritis of right hip, unspecified osteoarthritis type  Got >50% pain reduction with right hip injection with Dr. Carilyn.   As long as they are beneficial, you can get hip injections every 3 months

## 2023-11-29 NOTE — Patient Instructions (Signed)
 At the end of this month, I will refill your prescription for Tylenol  with codeine .  Given very infrequent use, I will refill the Scripts as needed, just contact the office.  Start aqua therapy and after 6 or so sessions message me if you are not seeing significant improvements in your mobility and pain control; at that point, we would get an MRI of your hip to look into other pathology and consider orthopedic referral for surgical intervention.  Continue being active  As long as they are beneficial, you can get hip injextions every 3 months

## 2023-12-03 DIAGNOSIS — G894 Chronic pain syndrome: Secondary | ICD-10-CM | POA: Insufficient documentation

## 2023-12-03 DIAGNOSIS — M1611 Unilateral primary osteoarthritis, right hip: Secondary | ICD-10-CM | POA: Insufficient documentation

## 2023-12-03 MED ORDER — ACETAMINOPHEN-CODEINE 300-30 MG PO TABS
1.0000 | ORAL_TABLET | Freq: Three times a day (TID) | ORAL | 0 refills | Status: AC | PRN
  Filled 2023-12-03: qty 30, 5d supply, fill #0

## 2023-12-04 ENCOUNTER — Other Ambulatory Visit: Payer: Self-pay

## 2023-12-07 ENCOUNTER — Other Ambulatory Visit: Payer: Self-pay

## 2023-12-12 ENCOUNTER — Encounter (HOSPITAL_BASED_OUTPATIENT_CLINIC_OR_DEPARTMENT_OTHER): Payer: Self-pay | Admitting: Physical Therapy

## 2023-12-12 ENCOUNTER — Telehealth: Payer: Self-pay

## 2023-12-12 ENCOUNTER — Other Ambulatory Visit: Payer: Self-pay

## 2023-12-12 ENCOUNTER — Ambulatory Visit (HOSPITAL_BASED_OUTPATIENT_CLINIC_OR_DEPARTMENT_OTHER): Attending: Physical Medicine and Rehabilitation | Admitting: Physical Therapy

## 2023-12-12 DIAGNOSIS — M6281 Muscle weakness (generalized): Secondary | ICD-10-CM | POA: Diagnosis present

## 2023-12-12 DIAGNOSIS — M25551 Pain in right hip: Secondary | ICD-10-CM | POA: Insufficient documentation

## 2023-12-12 DIAGNOSIS — M5441 Lumbago with sciatica, right side: Secondary | ICD-10-CM | POA: Diagnosis not present

## 2023-12-12 DIAGNOSIS — M5459 Other low back pain: Secondary | ICD-10-CM | POA: Diagnosis present

## 2023-12-12 DIAGNOSIS — R262 Difficulty in walking, not elsewhere classified: Secondary | ICD-10-CM | POA: Insufficient documentation

## 2023-12-12 DIAGNOSIS — G8929 Other chronic pain: Secondary | ICD-10-CM | POA: Diagnosis present

## 2023-12-12 NOTE — Telephone Encounter (Signed)
(  Key: [id number]) PA Case ID #: [id number] Status PENDING Drug Acetaminophen -Codeine  300-30MG  tablets ePA cloud logo Form [employer] Electronic Prior Authorization Form

## 2023-12-12 NOTE — Therapy (Signed)
 OUTPATIENT PHYSICAL THERAPY LE EVALUATION   Patient Name: Austin Beasley. MRN: [id number] DOB:June 09, 1980, 43 y.o., male Today's Date: 12/12/2023  END OF SESSION:  PT End of Session - 12/12/23 1111     Visit Number 1    Date for Recertification  02/23/24    Authorization Type [place] mcaid prepay    PT Start Time 0935    PT Stop Time 1015    PT Time Calculation (min) 40 min    Activity Tolerance Patient tolerated treatment well    Behavior During Therapy WFL for tasks assessed/performed          Past Medical History:  Diagnosis Date   Arthritis    right hip   Diabetes mellitus without complication (HCC)    High cholesterol    Hypertension    Obesity    Palpitations    Thyroid  disease    Past Surgical History:  Procedure Laterality Date   ELEVATION OF DEPRESSED SKULL FRACTURE     age 43   LAPAROSCOPIC GASTRIC SLEEVE RESECTION  11/16/2022   TONSILLECTOMY AND ADENOIDECTOMY     as early teenager   Patient Active Problem List   Diagnosis Date Noted   Osteoarthritis of right hip 12/03/2023   Chronic pain syndrome 12/03/2023   Chronic right hip pain 09/27/2023   Chronic bilateral low back pain with right-sided sciatica 09/27/2023   Achilles tendinitis of right lower extremity 06/01/2021   Pain due to onychomycosis of toenails of both feet 02/01/2021   Class 3 severe obesity with serious comorbidity and body mass index (BMI) of 50.0 to 59.9 in adult [facility]) 05/16/2019   Type 2 diabetes mellitus with diabetic neuropathy, without long-term current use of insulin  (HCC) 08/07/2018   Diabetes mellitus without complication (HCC) 05/23/2017    PCP: Haze Servant NP  REFERRING PROVIDER: Emeline Joesph BROCKS, DO   REFERRING DIAG:  [phone] (ICD-10-CM) - Chronic right hip pain  G89.29,M54.41 (ICD-10-CM) - Chronic bilateral low back pain with right-sided sciatica    Rationale for Evaluation and Treatment: Rehabilitation  THERAPY DIAG:  Chronic right hip pain  Muscle  weakness (generalized)  Difficulty in walking, not elsewhere classified  ONSET DATE: 1 year  SUBJECTIVE:                                                                                                                                                                                           SUBJECTIVE STATEMENT: I have lost 150 lbs in past 1.5 years.  Pain came on after that.  I was walking 3-5 miles a day to assist with weight loss but had to stop. Had a  cortizone shot in my hip and helped some. I expected more. 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 Chronic R hip and low back arhtritis. Aquatherapy referral for offloading joints, strengthening and endurance training to improve ambulatory tolerance.  Stand up to 10 hours day at work pain increases. Take Tylenol .     PAIN:  Are you having pain? Yes: NPRS scale: current 2/10; worst 9/10; Pain location: .. R hip pain radiating down into the right foot, +numbness/tingling when he is sitting down Pain description: .intermittent, constant, sharp, and aching, stiff from R hip wrapping to his groin--constant but does wax and wane Aggravating factors: SABRASABRAThrough the day; walking, standing after prolonged, first thing in the morning sitting Relieving factors: medication  PRECAUTIONS: None  RED FLAGS: None   WEIGHT BEARING RESTRICTIONS: No  FALLS:  Has patient fallen in last 6 months? 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  DIAGNOSTIC FINDINGS:  R hip x-ray 1/25 Age advanced arthropathy of the right hip with acetabular over coverage and  femoral head neck osteophytes. Similar findings are seen to a lesser extent involving the left hip.  PATIENT SURVEYS:  LEFS: 25/80  COGNITION: Overall cognitive status: Within functional limits for tasks assessed     SENSATION: WFL  MUSCLE LENGTH: Hamstrings: tight bilaterally tested in sitting   POSTURE: Pelvis was  even.  Lumbar lordotic curvature was increased .  There was mild dextroscoliosis. right hip in ER  PALPATION: TTP lumbar paraspinals and R PSIS   LOWER EXTREMITY ROM:     Active  Right eval Left eval  Hip flexion 65 wfl  Hip extension Limited by ~50%   Hip abduction Limited by ~75%   Hip adduction Limited by ~75%   Hip internal rotation Limited by ~75%   Hip external rotation    Knee flexion    Knee extension    Ankle dorsiflexion    Ankle plantarflexion    Ankle inversion    Ankle eversion     (Blank rows = not tested)  LOWER EXTREMITY MMT:    MMT Right eval Left eval  Hip flexion 20.9 65.8  Hip extension    Hip abduction 26.2 32.8  Hip adduction    Hip internal rotation    Hip external rotation    Knee flexion    Knee extension    Ankle dorsiflexion    Ankle plantarflexion    Ankle inversion    Ankle eversion     (Blank rows = not tested)  LUMBAR SPECIAL TESTS:  Slump test: Negative  FUNCTIONAL TESTS:  Timed up and go (TUG): 11.10 5 x STS 18.36   4 stage balance :passed GAIT: Distance walked: 500 ft Assistive device utilized: None Level of assistance: Complete Independence Comments: right hip in ER, antalgic limp off loading left  TREATMENT Eval Self care:Posture and body Curator instruction  PATIENT EDUCATION:  Education details: Discussed eval findings, rehab rationale, aquatic program progression/POC and pools in area. Patient is in agreement  Person educated: Patient Education method:  Explanation Education comprehension: verbalized understanding  HOME EXERCISE PROGRAM: TBA  ASSESSMENT:  CLINICAL IMPRESSION: Patient is a 43 y.o. m who was seen today for physical therapy evaluation and treatment for right hip and LBP. Pt with OA as per x-rays right hip likely causing radiating pain into rle and possibly in lumbar spine.  He presents with pain limited deficits in          , ROM, endurance, activity tolerance particularly standing, gait, balance, and functional mobility with ADL's. 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  As indicated by subjective information, objective measures and his outcome measure score he is limited in all functional mobility and ADL's. He will benefit from skilled PT intervention to improve all deficits.  Initial plan is to progress with aquatics. Will transition onto land as approp.      OBJECTIVE IMPAIRMENTS: Abnormal gait, decreased activity tolerance, decreased balance, decreased mobility, difficulty walking, decreased ROM, decreased strength, obesity, and pain.   ACTIVITY LIMITATIONS: lifting, bending, sitting, standing, squatting, sleeping, stairs, transfers, and locomotion level  PARTICIPATION LIMITATIONS: community activity, occupation, and yard work  PERSONAL FACTORS: Age, Fitness, and 1 comorbidity: obesity are also affecting patient's functional outcome.   REHAB POTENTIAL: Good  CLINICAL DECISION MAKING: Stable/uncomplicated  EVALUATION COMPLEXITY: Low   GOALS: Goals reviewed with patient? Yes  SHORT TERM GOALS: Target date: 01/06/24  Pt will tolerate full aquatic sessions consistently without increase in pain and with improving function to demonstrate good toleration and effectiveness of intervention.  Baseline: Goal status: INITIAL  2.  Pt will consider gaining pool access for use of the properties of water for chronic conditions maintaining mobility and minimizing pain.   Baseline:  Goal status: INITIAL  3.  Pt will report decrease in pain submerged to demonstrate one of the advantages of exercise in aquatics using the properties of water. Baseline:  Goal status: INITIAL    LONG TERM GOALS: Target date: 02/23/24  Pt to improve on LEFS by at least 9 point to demonstrate statistically significant Improvement in function. Baseline: 25/80 Goal status: INITIAL  2.  Pt will report decrease in pain by at least 50% for improved toleration to activity/quality of life and to demonstrate improved management of pain. Baseline:  Goal status: INITIAL  3.  Pt will improve R hip strength flex by at least 10 lbs to demonstrate improved overall physical function Baseline:  Goal status: INITIAL  4.  Pt will report return to consistent exercise regimen for overall wellness. Baseline:  Goal status: INITIAL  5.  Pt will improve ROM of R hip by 25% to improve function, decrease risk of injury and pain and improve quality of life Baseline:  Goal status: INITIAL  6.  Pt will be indep with final HEP's (land and aquatic as appropriate) for continued management of condition Baseline:  Goal status: INITIAL  PLAN:  PT FREQUENCY: 1-2x/week  PT DURATION: 8 weeks  PLANNED INTERVENTIONS: 97164- PT Re-evaluation, 97110-Therapeutic exercises, 97530- Therapeutic activity, 97112- Neuromuscular re-education, 97535- Self Care, [id number]- Manual therapy, [id number]- Gait training, [phone]- Aquatic Therapy, [phone] (1-2 muscles), 20561 (3+ muscles)- Dry Needling, Patient/Family education, Balance training, Stair training, Taping, Joint mobilization, DME instructions, Cryotherapy, and Moist heat.  PLAN FOR NEXT SESSION: aquatic: right hip and LB ROM strengthening and  pain management.  Balance and proprioception re-training   Ronal Foots) Nguyet Mercer MPT 12/12/23 11:13 AM [facility] [address] [place], [place], [address] Phone:  [phone]   Fax:  [phone]   For all possible CPT codes, reference the Planned Interventions line above.     Check all conditions that are expected to impact treatment: {Conditions expected to impact treatment:Morbid obesity and Musculoskeletal disorders   If treatment provided at initial evaluation, no treatment charged due to lack of authorization.

## 2023-12-13 NOTE — Telephone Encounter (Signed)
 Outcome Denied on October 7 by [employer] 2017 Denied Drug Acetaminophen -Codeine  300-30MG  tablets ePA cloud logo Form [employer] Electronic Prior Authorization Form

## 2023-12-14 NOTE — Telephone Encounter (Signed)
 Thank you; given very infrequent use we will not be appealing.

## 2023-12-21 ENCOUNTER — Ambulatory Visit (HOSPITAL_BASED_OUTPATIENT_CLINIC_OR_DEPARTMENT_OTHER): Admitting: Physical Therapy

## 2023-12-28 ENCOUNTER — Encounter (HOSPITAL_BASED_OUTPATIENT_CLINIC_OR_DEPARTMENT_OTHER): Payer: Self-pay | Admitting: Physical Therapy

## 2023-12-28 ENCOUNTER — Ambulatory Visit (HOSPITAL_BASED_OUTPATIENT_CLINIC_OR_DEPARTMENT_OTHER): Admitting: Physical Therapy

## 2023-12-28 DIAGNOSIS — G8929 Other chronic pain: Secondary | ICD-10-CM

## 2023-12-28 DIAGNOSIS — R262 Difficulty in walking, not elsewhere classified: Secondary | ICD-10-CM

## 2023-12-28 DIAGNOSIS — M6281 Muscle weakness (generalized): Secondary | ICD-10-CM

## 2023-12-28 DIAGNOSIS — M25551 Pain in right hip: Secondary | ICD-10-CM | POA: Diagnosis not present

## 2023-12-28 NOTE — Therapy (Signed)
 OUTPATIENT PHYSICAL THERAPY LE TREATMENT   Patient Name: Austin Beasley. MRN: [id number] DOB:[birth year], 43 y.o., male Today's Date: 12/28/2023  END OF SESSION:  PT End of Session - 12/28/23 0946     Visit Number 2    Date for Recertification  02/23/24    Authorization Type [place] mcaid prepay    PT Start Time 0934    PT Stop Time 1012    PT Time Calculation (min) 38 min    Activity Tolerance Patient tolerated treatment well    Behavior During Therapy WFL for tasks assessed/performed          Past Medical History:  Diagnosis Date   Arthritis    right hip   Diabetes mellitus without complication (HCC)    High cholesterol    Hypertension    Obesity    Palpitations    Thyroid  disease    Past Surgical History:  Procedure Laterality Date   ELEVATION OF DEPRESSED SKULL FRACTURE     age 12   LAPAROSCOPIC GASTRIC SLEEVE RESECTION  11/16/2022   TONSILLECTOMY AND ADENOIDECTOMY     as early teenager   Patient Active Problem List   Diagnosis Date Noted   Osteoarthritis of right hip 12/03/2023   Chronic pain syndrome 12/03/2023   Chronic right hip pain 09/27/2023   Chronic bilateral low back pain with right-sided sciatica 09/27/2023   Achilles tendinitis of right lower extremity 06/01/2021   Pain due to onychomycosis of toenails of both feet 02/01/2021   Class 3 severe obesity with serious comorbidity and body mass index (BMI) of 50.0 to 59.9 in adult [facility]) 05/16/2019   Type 2 diabetes mellitus with diabetic neuropathy, without long-term current use of insulin  (HCC) 08/07/2018   Diabetes mellitus without complication (HCC) 05/23/2017    PCP: Haze Servant NP  REFERRING PROVIDER: Emeline Joesph BROCKS, DO   REFERRING DIAG:  [phone] (ICD-10-CM) - Chronic right hip pain  G89.29,M54.41 (ICD-10-CM) - Chronic bilateral low back pain with right-sided sciatica    Rationale for Evaluation and Treatment: Rehabilitation  THERAPY DIAG:  Chronic right hip pain  Muscle  weakness (generalized)  Difficulty in walking, not elsewhere classified  ONSET DATE: 1 year  SUBJECTIVE:                                                                                                                                                                                           SUBJECTIVE STATEMENT: No new changes since evaluation.    POOL ACCESS: currently none.   From initial evaluation:  I have lost 150 lbs in past 1.5 years.  Pain came on after that.  I was walking 3-5 miles a day to assist with weight loss but had to stop. Had a cortizone shot in my hip and helped some. I expected more. 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 Chronic R hip and low back arhtritis. Aquatherapy referral for offloading joints, strengthening and endurance training to improve ambulatory tolerance.  Stand up to 10 hours day at work pain increases. Take Tylenol .     PAIN:  Are you having pain? Yes: NPRS scale: current 2/10; worst 9/10; Pain location: .. R hip pain radiating down into the right foot, +numbness/tingling when he is sitting down Pain description: .intermittent, constant, sharp, and aching, stiff from R hip wrapping to his groin--constant but does wax and wane Aggravating factors: SABRASABRAThrough the day; walking, standing after prolonged, first thing in the morning sitting Relieving factors: medication  PRECAUTIONS: None  RED FLAGS: None   WEIGHT BEARING RESTRICTIONS: No  FALLS:  Has patient fallen in last 6 months? 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  DIAGNOSTIC FINDINGS:   R hip x-ray 1/25 Age advanced arthropathy of the right hip with acetabular over coverage and femoral head neck osteophytes. Similar findings are seen to a lesser extent involving the left hip.  PATIENT SURVEYS:  LEFS: 25/80  COGNITION: Overall cognitive status: Within functional limits for tasks assessed     SENSATION: WFL  MUSCLE LENGTH: Hamstrings: tight bilaterally tested in sitting   POSTURE: Pelvis was  even.  Lumbar lordotic curvature was increased .  There was mild dextroscoliosis. right hip in ER  PALPATION: TTP lumbar paraspinals and R PSIS   LOWER EXTREMITY ROM:     Active  Right eval Left eval  Hip flexion 65 wfl  Hip extension Limited by ~50%   Hip abduction Limited by ~75%   Hip adduction Limited by ~75%   Hip internal rotation Limited by ~75%   Hip external rotation    Knee flexion    Knee extension    Ankle dorsiflexion    Ankle plantarflexion    Ankle inversion    Ankle eversion     (Blank rows = not tested)  LOWER EXTREMITY MMT:    MMT Right eval Left eval  Hip flexion 20.9 65.8  Hip extension    Hip abduction 26.2 32.8  Hip adduction    Hip internal rotation    Hip external rotation    Knee flexion    Knee extension    Ankle dorsiflexion    Ankle plantarflexion    Ankle inversion    Ankle eversion     (Blank rows = not tested)  LUMBAR SPECIAL TESTS:  Slump test: Negative  FUNCTIONAL TESTS:  Timed up and go (TUG): 11.10 5 x STS 18.36   4 stage balance :passed GAIT: Distance walked: 500 ft Assistive device utilized: None Level of assistance: Complete Independence Comments: right hip in ER, antalgic limp off loading left  TREATMENT [facility] Adult PT Treatment:  Date: 12/28/23 Pt seen for aquatic therapy today.  Treatment took place in water 3.5-4.75 ft in depth at the [facility] pool. Temp of water was 91.  Pt entered/exited the pool via stairs in step-to, hop forward with  bil rail.  - Intro to aquatic therapy principles - unsupported walking forward/ backward -> with UE on blue hand floats, cues for even step length - UE on blue hand floats side stepping  - UE on wall:  heel raises x 10; hip add/abdct 2x5 ; hip extension x 8; relaxed squat x 1 - return to walking backwards, UE on wall, cues for even step length -Bil KTC stretch at ladder - L stretch with slightly bent knees - Old man stretch into lumbar extension  Pt requires the buoyancy and hydrostatic pressure of water for support, and to offload joints by unweighting joint load by at least 50 % in navel deep water and by at least 75-80% in chest to neck deep water.  Viscosity of the water is needed for resistance of strengthening. Water current perturbations provides challenge to standing balance requiring increased core activation.                                                                                                                                  PATIENT EDUCATION:  Education details: intro to aquatic therapy  Person educated: Patient Education method: Explanation, demo  Education comprehension: verbalized understanding, return demo  HOME EXERCISE PROGRAM: TBA  ASSESSMENT:  CLINICAL IMPRESSION: Pt reported increased pain in hip with forward walking and side stepping L.  He is observed with increased weight in LLE, in wide BOS stance. He reports increased tension in bil hips when bringing feet fully together.  Pain reduced by end of session.  Didn't tolerate knee to chest stretch, but did enjoy lumbar extension stretch. He is confident in water and therapist instructed pt from deck.  Goals are ongoing.    Initial evaluation:  Patient is a 43 y.o. m who was seen today for physical therapy evaluation and treatment for right hip and LBP. Pt with OA as per x-rays right hip likely causing radiating pain into rle and possibly in lumbar spine.  He presents with pain limited deficits in           , ROM, endurance, activity tolerance particularly standing, gait, balance, and functional mobility with ADL's. 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  As indicated by subjective information, objective measures and his outcome measure score he is limited in all functional mobility and ADL's. He will benefit from skilled PT intervention to improve all deficits.  Initial plan is to progress with aquatics. Will transition onto land as approp.      OBJECTIVE IMPAIRMENTS: Abnormal gait, decreased activity tolerance, decreased balance, decreased mobility, difficulty walking, decreased ROM, decreased strength, obesity, and pain.   ACTIVITY LIMITATIONS:  lifting, bending, sitting, standing, squatting, sleeping, stairs, transfers, and locomotion level  PARTICIPATION LIMITATIONS: community activity, occupation, and yard work  PERSONAL FACTORS: Age, Fitness, and 1 comorbidity: obesity are also affecting patient's functional outcome.   REHAB POTENTIAL: Good  CLINICAL DECISION MAKING: Stable/uncomplicated  EVALUATION COMPLEXITY: Low   GOALS: Goals reviewed with patient? Yes  SHORT TERM GOALS: Target date: 01/06/24  Pt will tolerate full aquatic sessions consistently without increase in pain and with improving function to demonstrate good toleration and effectiveness of intervention.  Baseline: Goal status: INITIAL  2.  Pt will consider gaining pool access for use of the properties of water for chronic conditions maintaining mobility and minimizing pain.  Baseline:  Goal status: INITIAL  3.  Pt will report decrease in pain submerged to demonstrate one of the advantages of exercise in aquatics using the properties of water. Baseline:  Goal status: INITIAL    LONG TERM GOALS: Target date: 02/23/24  Pt to improve on LEFS by at least 9 point to demonstrate statistically significant Improvement in function. Baseline: 25/80 Goal status:  INITIAL  2.  Pt will report decrease in pain by at least 50% for improved toleration to activity/quality of life and to demonstrate improved management of pain. Baseline:  Goal status: INITIAL  3.  Pt will improve R hip strength flex by at least 10 lbs to demonstrate improved overall physical function Baseline:  Goal status: INITIAL  4.  Pt will report return to consistent exercise regimen for overall wellness. Baseline:  Goal status: INITIAL  5.  Pt will improve ROM of R hip by 25% to improve function, decrease risk of injury and pain and improve quality of life Baseline:  Goal status: INITIAL  6.  Pt will be indep with final HEP's (land and aquatic as appropriate) for continued management of condition Baseline:  Goal status: INITIAL  PLAN:  PT FREQUENCY: 1-2x/week  PT DURATION: 8 weeks  PLANNED INTERVENTIONS: 97164- PT Re-evaluation, 97110-Therapeutic exercises, 97530- Therapeutic activity, 97112- Neuromuscular re-education, 97535- Self Care, 02859- Manual therapy, [id number]- Gait training, [phone]- Aquatic Therapy, [phone] (1-2 muscles), [phone] (3+ muscles)- Dry Needling, Patient/Family education, Balance training, Stair training, Taping, Joint mobilization, DME instructions, Cryotherapy, and Moist heat.  PLAN FOR NEXT SESSION: aquatic: right hip and LB ROM strengthening and pain management.  Balance and proprioception re-training    For all possible CPT codes, reference the Planned Interventions line above.     Check all conditions that are expected to impact treatment: {Conditions expected to impact treatment:Morbid obesity and Musculoskeletal disorders   If treatment provided at initial evaluation, no treatment charged due to lack of authorization.      Delon Aquas, PTA 12/28/23 5:37 PM [facility] [address] [place], [place], [address] Phone: [phone]   Fax:  [phone]

## 2024-01-04 ENCOUNTER — Ambulatory Visit (HOSPITAL_BASED_OUTPATIENT_CLINIC_OR_DEPARTMENT_OTHER): Admitting: Physical Therapy

## 2024-01-04 ENCOUNTER — Encounter (HOSPITAL_BASED_OUTPATIENT_CLINIC_OR_DEPARTMENT_OTHER): Payer: Self-pay | Admitting: Physical Therapy

## 2024-01-04 DIAGNOSIS — M5459 Other low back pain: Secondary | ICD-10-CM

## 2024-01-04 DIAGNOSIS — M25551 Pain in right hip: Secondary | ICD-10-CM | POA: Diagnosis not present

## 2024-01-04 DIAGNOSIS — R262 Difficulty in walking, not elsewhere classified: Secondary | ICD-10-CM

## 2024-01-04 DIAGNOSIS — G8929 Other chronic pain: Secondary | ICD-10-CM

## 2024-01-04 DIAGNOSIS — M6281 Muscle weakness (generalized): Secondary | ICD-10-CM

## 2024-01-04 NOTE — Therapy (Signed)
 OUTPATIENT PHYSICAL THERAPY LE TREATMENT   Patient Name: Austin Beasley. MRN: [id number] DOB:[birth year], 43 y.o., male Today's Date: 01/04/2024  END OF SESSION:  PT End of Session - 01/04/24 0913     Visit Number 3    Date for Recertification  02/23/24    Authorization Type [place] mcaid prepay    PT Start Time 0850    PT Stop Time 0934    PT Time Calculation (min) 44 min    Activity Tolerance Patient tolerated treatment well    Behavior During Therapy WFL for tasks assessed/performed          Past Medical History:  Diagnosis Date   Arthritis    right hip   Diabetes mellitus without complication (HCC)    High cholesterol    Hypertension    Obesity    Palpitations    Thyroid  disease    Past Surgical History:  Procedure Laterality Date   ELEVATION OF DEPRESSED SKULL FRACTURE     age 60   LAPAROSCOPIC GASTRIC SLEEVE RESECTION  11/16/2022   TONSILLECTOMY AND ADENOIDECTOMY     as early teenager   Patient Active Problem List   Diagnosis Date Noted   Osteoarthritis of right hip 12/03/2023   Chronic pain syndrome 12/03/2023   Chronic right hip pain 09/27/2023   Chronic bilateral low back pain with right-sided sciatica 09/27/2023   Achilles tendinitis of right lower extremity 06/01/2021   Pain due to onychomycosis of toenails of both feet 02/01/2021   Class 3 severe obesity with serious comorbidity and body mass index (BMI) of 50.0 to 59.9 in adult [facility]) 05/16/2019   Type 2 diabetes mellitus with diabetic neuropathy, without long-term current use of insulin  (HCC) 08/07/2018   Diabetes mellitus without complication (HCC) 05/23/2017    PCP: Haze Servant NP  REFERRING PROVIDER: Emeline Joesph BROCKS, DO   REFERRING DIAG:  [phone] (ICD-10-CM) - Chronic right hip pain  G89.29,M54.41 (ICD-10-CM) - Chronic bilateral low back pain with right-sided sciatica    Rationale for Evaluation and Treatment: Rehabilitation  THERAPY DIAG:  Chronic right hip pain  Muscle  weakness (generalized)  Difficulty in walking, not elsewhere classified  Other low back pain  ONSET DATE: 1 year  SUBJECTIVE:                                                                                                                                                                                           SUBJECTIVE STATEMENT: Pt reports he feels better this week than last.  Had mild soreness after last session.  Took arthritis tylenol  prior to session. He reports he has been trying  to bring his Rt foot into more neutral position when standing during day.   POOL ACCESS: limited access to [facility] (works at [employer])    From initial evaluation:  I have lost 150 lbs in past 1.5 years.  Pain came on after that.  I was walking 3-5 miles a day to assist with weight loss but had to stop. Had a cortizone shot in my hip and helped some. I expected more. 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 Chronic R hip and low back arhtritis. Aquatherapy referral for offloading joints, strengthening and endurance training to improve ambulatory tolerance.  Stand up to 10 hours day at work pain increases. Take Tylenol .     PAIN:  Are you having pain? no: NPRS scale: current 0/10; worst 9/10; Pain location: .. R hip pain radiating down into the right foot, +numbness/tingling when he is sitting down Pain description: .intermittent, constant, sharp, and aching, stiff from R hip wrapping to his groin--constant but does wax and wane Aggravating factors: SABRASABRAThrough the day; walking, standing after prolonged, first thing in the morning sitting Relieving factors: medication  PRECAUTIONS: None  RED FLAGS: None   WEIGHT BEARING RESTRICTIONS: No  FALLS:  Has patient fallen in last 6 months? 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  DIAGNOSTIC FINDINGS:  R hip x-ray 1/25 Age advanced arthropathy of the right hip with acetabular over coverage and femoral head neck osteophytes. Similar findings are seen to a lesser extent involving the left hip.  PATIENT SURVEYS:  LEFS: 25/80  COGNITION: Overall cognitive status: Within functional limits for tasks assessed     SENSATION: WFL  MUSCLE LENGTH: Hamstrings: tight bilaterally tested in sitting   POSTURE: Pelvis was  even.  Lumbar lordotic curvature was increased .  There was mild dextroscoliosis. right hip in ER  PALPATION: TTP lumbar paraspinals and R PSIS   LOWER EXTREMITY ROM:     Active  Right eval Left eval  Hip flexion 65 wfl  Hip extension Limited by ~50%   Hip abduction Limited by ~75%   Hip adduction Limited by ~75%   Hip internal rotation Limited by ~75%   Hip external rotation    Knee flexion    Knee extension    Ankle dorsiflexion    Ankle plantarflexion    Ankle inversion    Ankle eversion     (Blank rows = not tested)  LOWER EXTREMITY MMT:    MMT Right eval Left eval  Hip flexion 20.9 65.8  Hip extension    Hip abduction 26.2 32.8  Hip adduction    Hip internal rotation    Hip external rotation    Knee flexion    Knee extension    Ankle dorsiflexion    Ankle plantarflexion    Ankle inversion    Ankle eversion     (Blank rows = not tested)  LUMBAR SPECIAL TESTS:  Slump test: Negative  FUNCTIONAL TESTS:  Timed up and go (TUG): 11.10 5 x STS 18.36   4 stage balance :passed GAIT: Distance walked: 500 ft Assistive device utilized: None Level of assistance: Complete  Independence Comments: right hip in ER, antalgic limp off loading left  TREATMENT [facility] Adult PT Treatment:                                              Date: 01/04/24 Pt seen for aquatic therapy today.  Treatment took place in water 3.5-4.75 ft in depth at the [facility] pool. Temp of water was 91.  Pt entered/exited the pool via stairs in step-to, hop forward with bil rail.  - with UE on yellow hand floats: walking forward/ backward multiple laps, cues for even step length; side stepping  - UE on wall: relaxed squat  - UE on yellow hand floats:  heel raises x 10; hip add/abdct x5, x 10 ;  - UE On wall: hip flexion/extension x 10; relaxed squat  - return to walking backwards/forwards, UE on wall, cues for even step length, reciprocal arm swing -Bil KTC stretch at ladder (painful, limited knee flexion) - Old man stretch into lumbar extension (neutral) - plank at bench in water with alternating LE hip extension  Pt requires the buoyancy and hydrostatic pressure of water for support, and to offload joints by unweighting joint load by at least 50 % in navel deep water and by at least 75-80% in chest to neck deep water.  Viscosity of the water is needed for resistance of strengthening. Water current perturbations provides challenge to standing balance requiring increased core activation.                                                                                                                                  PATIENT EDUCATION:  Education details: intro to aquatic therapy  Person educated: Patient Education method: Programmer, Multimedia, demo  Education comprehension: verbalized understanding, return demo  HOME EXERCISE PROGRAM: TBA  ASSESSMENT:  CLINICAL IMPRESSION: Pt reported slight increase in pain in Rt ant hip during session with ambulation and leg swings; relieved with squatted rest period.  He is given cues to move RLE more into midline with gait and exercise. Encouraged pt to not over-do it after session and to explore pool access outside of therapy session.  Goals are ongoing.    Initial evaluation:   Patient is a 43 y.o. m who was seen today for physical therapy evaluation and treatment for right hip and LBP. Pt with OA as per x-rays right hip likely causing radiating pain into rle and possibly in lumbar spine.  He presents with pain limited deficits in          , ROM, endurance, activity tolerance particularly standing, gait, balance, and functional mobility with ADL's. 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  As indicated by subjective information, objective measures and his outcome measure score he is  limited in all functional mobility and ADL's. He will benefit from skilled PT intervention to improve all deficits.  Initial plan is to progress with aquatics. Will transition onto land as approp.      OBJECTIVE IMPAIRMENTS: Abnormal gait, decreased activity tolerance, decreased balance, decreased mobility, difficulty walking, decreased ROM, decreased strength, obesity, and pain.   ACTIVITY LIMITATIONS: lifting, bending, sitting, standing, squatting, sleeping, stairs, transfers, and locomotion level  PARTICIPATION LIMITATIONS: community activity, occupation, and yard work  PERSONAL FACTORS: Age, Fitness, and 1 comorbidity: obesity are also affecting patient's functional outcome.   REHAB POTENTIAL: Good  CLINICAL DECISION MAKING: Stable/uncomplicated  EVALUATION COMPLEXITY: Low   GOALS: Goals reviewed with patient? Yes  SHORT TERM GOALS: Target date: 01/06/24  Pt will tolerate full aquatic sessions consistently without increase in pain and with improving function to demonstrate good toleration and effectiveness of intervention.  Baseline: Goal status: INITIAL  2.  Pt will consider gaining pool access for use of the properties of water for chronic conditions maintaining mobility and minimizing pain.  Baseline:  Goal status: INITIAL  3.  Pt will report decrease in pain submerged to demonstrate one of the advantages of exercise in  aquatics using the properties of water. Baseline:  Goal status: INITIAL    LONG TERM GOALS: Target date: 02/23/24  Pt to improve on LEFS by at least 9 point to demonstrate statistically significant Improvement in function. Baseline: 25/80 Goal status: INITIAL  2.  Pt will report decrease in pain by at least 50% for improved toleration to activity/quality of life and to demonstrate improved management of pain. Baseline:  Goal status: INITIAL  3.  Pt will improve R hip strength flex by at least 10 lbs to demonstrate improved overall physical function Baseline:  Goal status: INITIAL  4.  Pt will report return to consistent exercise regimen for overall wellness. Baseline:  Goal status: INITIAL  5.  Pt will improve ROM of R hip by 25% to improve function, decrease risk of injury and pain and improve quality of life Baseline:  Goal status: INITIAL  6.  Pt will be indep with final HEP's (land and aquatic as appropriate) for continued management of condition Baseline:  Goal status: INITIAL  PLAN:  PT FREQUENCY: 1-2x/week  PT DURATION: 8 weeks  PLANNED INTERVENTIONS: 97164- PT Re-evaluation, 97110-Therapeutic exercises, 97530- Therapeutic activity, 97112- Neuromuscular re-education, 97535- Self Care, 02859- Manual therapy, [id number]- Gait training, [phone]- Aquatic Therapy, [phone] (1-2 muscles), 20561 (3+ muscles)- Dry Needling, Patient/Family education, Balance training, Stair training, Taping, Joint mobilization, DME instructions, Cryotherapy, and Moist heat.  PLAN FOR NEXT SESSION: aquatic: right hip and LB ROM strengthening and pain management.  Balance and proprioception re-training  Delon Aquas, PTA 01/04/24 9:46 AM [facility] [address] [place], [place], [address] Phone: [phone]   Fax:  [phone]   For all possible CPT codes, reference the Planned Interventions line above.     Check all conditions that  are expected to impact treatment: {Conditions expected to impact treatment:Morbid obesity and Musculoskeletal disorders   If treatment provided at initial evaluation, no treatment charged due to lack of authorization.

## 2024-01-09 ENCOUNTER — Encounter: Payer: Self-pay | Admitting: Nurse Practitioner

## 2024-01-11 ENCOUNTER — Ambulatory Visit (HOSPITAL_BASED_OUTPATIENT_CLINIC_OR_DEPARTMENT_OTHER): Admitting: Physical Therapy

## 2024-01-11 ENCOUNTER — Other Ambulatory Visit (HOSPITAL_COMMUNITY): Payer: Self-pay

## 2024-01-12 ENCOUNTER — Other Ambulatory Visit (HOSPITAL_COMMUNITY): Payer: Self-pay

## 2024-01-16 ENCOUNTER — Other Ambulatory Visit: Payer: Self-pay | Admitting: Nurse Practitioner

## 2024-01-16 DIAGNOSIS — E119 Type 2 diabetes mellitus without complications: Secondary | ICD-10-CM

## 2024-01-16 MED ORDER — OZEMPIC (0.25 OR 0.5 MG/DOSE) 2 MG/3ML ~~LOC~~ SOPN
0.5000 mg | PEN_INJECTOR | SUBCUTANEOUS | 0 refills | Status: DC
  Filled 2024-01-16 – 2024-01-17 (×2): qty 3, 28d supply, fill #0

## 2024-01-16 NOTE — Progress Notes (Unsigned)
 Subjective:    Patient ID: Austin Beasley., male    DOB: 10-09-1980, 43 y.o.   MRN: [id number]  HPI  Virtual Visit via Video Note  I connected with [name]. on 01/17/24 at  1:40 PM EST by a video enabled telemedicine application and verified that I am speaking with the correct person using two identifiers.  Location: Patient: work Provider: office   I discussed the limitations of evaluation and management by telemedicine and the availability of in person appointments. The patient expressed understanding and agreed to proceed.    The patient was advised to call back or seek an in-person evaluation if the symptoms worsen or if the condition fails to improve as anticipated.  I provided 14 minutes of non-face-to-face time during this encounter; 2 minutes via video, 12 minutes via phone after loss of sound.    Austin Beasley. is a 42 y.o. year old male  who  has a past medical history of Arthritis, Diabetes mellitus without complication (HCC), High cholesterol, Hypertension, Obesity, Palpitations, and Thyroid  disease.   They are presenting to PM&R clinic for follow up related to R hip OA pain .  Plan from last visit: Chronic pain syndrome Chronic right hip pain   At the end of this month, I will refill your prescription for Tylenol  with codeine .  Given very infrequent use, I will refill the Scripts as needed, just contact the office.   Start aqua therapy and after 6 or so sessions message me if you are not seeing significant improvements in your mobility and pain control; at that point, we would get an MRI of your hip to look into other pathology and consider orthopedic referral for surgical intervention.   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    Osteoarthritis of right hip, unspecified osteoarthritis type   Got >50% pain reduction with right hip injection with Dr. Carilyn.    As long as they are beneficial, you can get hip  injections every 3 months   Interval Hx:   Getting mild benefit from PT; has had a few sessions.    Was able to get medication 2 weeks ago (11/2023 based on pdmp) and feels it is working well. Unfortunately insurance denied further refills so will likely need to switch once out. He uses it sparingly, at nightitme.   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   Injection worked for about a month and then stopped working  Takes tylenol  at work and tylenol  with codeine  at home because it makes him tired. Flexeril  makes him too tired to take at work but helps 1-2 hours before bed to help him get some sleep.    Denied falls or traumatic injuries.   Pain Inventory Average Pain 8 Pain Right Now 3 My pain is intermittent, constant, sharp, burning, dull, stabbing, tingling, and aching  In the last 24 hours, has pain interfered with the following? General activity 6 Relation with others 8 Enjoyment of life 8 What TIME of day is your pain at its worst? evening and varies Sleep (in general) Fair  Pain is worse with: walking, sitting, some activites, and weather Pain improves with: rest, medication, and heat Relief from Meds: 10  Family History  Problem Relation Age of Onset   Breast cancer Mother  Hypertension Father    Breast cancer Sister    Birth defects Maternal Grandmother    Breast cancer Maternal Grandmother    Birth defects Maternal Grandfather    Birth defects Paternal Grandmother    Birth defects Paternal Grandfather    Colon cancer Paternal Grandfather    Esophageal cancer Neg Hx    Rectal cancer Neg Hx    Stomach cancer Neg Hx    Social History   Socioeconomic History   Marital status: Married    Spouse name: Not on file   Number of children: 1   Years of education: Not on file   Highest education level: Some college, no degree  Occupational History   Occupation: self  employed  Tobacco Use   Smoking status: Some Days    Current packs/day: 0.25    Average packs/day: 0.3 packs/day for 27.0 years (6.8 ttl pk-yrs)    Types: Cigarettes   Smokeless tobacco: Never  Vaping Use   Vaping status: Former   Substances: CBD  Substance and Sexual Activity   Alcohol use: Not Currently    Comment: occ   Drug use: Not Currently    Types: Marijuana   Sexual activity: Yes  Other Topics Concern   Not on file  Social History Narrative   Not on file   Social Drivers of Health   Financial Resource Strain: Low Risk  (11/27/2023)   Received [facility]   Overall Financial Resource Strain (CARDIA)    How hard is it for you to pay for the very basics like food, housing, medical care, and heating?: Not hard at all  Recent Concern: Financial Resource Strain - Medium Risk (09/05/2023)   Received from [facility]   Overall Financial Resource Strain (CARDIA)    Difficulty of Paying Living Expenses: Somewhat hard  Food Insecurity: No Food Insecurity (11/27/2023)   Received from [facility]   Hunger Vital Sign    Within the past 12 months, you worried that your food would run out before you got the money to buy more.: Never true    Within the past 12 months, the food you bought just didn't last and you didn't have money to get more.: Never true  Recent Concern: Food Insecurity - Food Insecurity Present (09/05/2023)   Received from [facility]   Hunger Vital Sign    Within the past 12 months, you worried that your food would run out before you got the money to buy more.: Sometimes true    Within the past 12 months, the food you bought just didn't last and you didn't have money to get more.: Patient declined  Transportation Needs: No Transportation Needs (11/27/2023)   Received from [facility] - Transportation    In the past 12 months, has lack of transportation kept you from medical appointments or from getting medications?: No    In the past 12  months, has lack of transportation kept you from meetings, work, or from getting things needed for daily living?: No  Physical Activity: Sufficiently Active (09/05/2023)   Received from [facility]   Exercise Vital Sign    On average, how many days per week do you engage in moderate to strenuous exercise (like a brisk walk)?: 2 days    On average, how many minutes do you engage in exercise at this level?: 140 min  Stress: No Stress Concern Present (09/05/2023)   Received from [facility] -  Occupational Stress Questionnaire    Feeling of Stress : Not at all  Social Connections: Socially Integrated (09/05/2023)   Received from [facility]   Social Network    How would you rate your social network (family, work, friends)?: Good participation with social networks   Past Surgical History:  Procedure Laterality Date   ELEVATION OF DEPRESSED SKULL FRACTURE     age 56   LAPAROSCOPIC GASTRIC SLEEVE RESECTION  11/16/2022   TONSILLECTOMY AND ADENOIDECTOMY     as early teenager   Past Surgical History:  Procedure Laterality Date   ELEVATION OF DEPRESSED SKULL FRACTURE     age 85   LAPAROSCOPIC GASTRIC SLEEVE RESECTION  11/16/2022   TONSILLECTOMY AND ADENOIDECTOMY     as early teenager   Past Medical History:  Diagnosis Date   Arthritis    right hip   Diabetes mellitus without complication (HCC)    High cholesterol    Hypertension    Obesity    Palpitations    Thyroid  disease    There were no vitals taken for this visit.  Opioid Risk Score:   Fall Risk Score:  `1  Depression screen [facility] 2/9     09/27/2023   11:43 AM 09/26/2023    8:39 AM 06/27/2023   11:07 AM 03/24/2023    9:42 AM 12/16/2022    9:42 AM 05/09/2022   10:39 AM 03/04/2022   10:57 AM  Depression screen PHQ 2/9  Decreased Interest 0 0 0 0 0 0 0  Down, Depressed, Hopeless 0 0 0 0 0 0 0  PHQ - 2 Score 0 0 0 0 0 0 0  Altered sleeping 0 0 0 0 0  0  Tired, decreased energy 0  0 0 0 0  0  Change in appetite 0 0 0 0 0  0  Feeling bad or failure about yourself  0 0 0 0 0  0  Trouble concentrating 0 0 0 0 0  0  Moving slowly or fidgety/restless 0 0 0 0 0  0  Suicidal thoughts 0 0 0 0 0  0  PHQ-9 Score 0  0  0  0  0   0   Difficult doing work/chores  Not difficult at all Not difficult at all         Data saved with a previous flowsheet row definition    Review of Systems  Musculoskeletal:        Right hip pain  All other systems reviewed and are negative.      Objective:   Physical Exam  PE: Constitution: Appropriate appearance for age. No apparent distress  +Obese Resp: No respiratory distress. No accessory muscle usage.  Cardio: Well perfused appearance.  Abdomen: Nondistended. Psych: Appropriate mood and affect. Neuro: AAOx4. No apparent cognitive deficits   Neurologic Exam:   Moving all 4 extremities antigravity and against resistance Coordination: Fine motor coordination was normal.   Gait: +antalgic gait offloading R leg       Assessment & Plan:   Pearley Baranek. is a 43 y.o. year old male  who  has a past medical history of Arthritis, Diabetes mellitus without complication (HCC), High cholesterol, Hypertension, Obesity, Palpitations, and Thyroid  disease.   They are presenting to PM&R clinic for R OA hip pain  Assessment and Plan  Chronic pain OA R hip Low back pain  Has failed injections and is getting minimal benefit from PT; has advanced arthritis on xray and very  mobile/demanding job. Referral to orthopedic surgery for R hip eval.  Continue PT  Continue daytime tylenol  staying under 2000 mg total daily  Switch flexeril  to Robaxin  500 mg TID PRN as flexeril  too sedating; can call to switch back if needed  Insurance denied further refills of tylenol  with codeine ; when next refill required will switch to norco 5 mg daily PRN #30 tabs  Follow up in 3 months

## 2024-01-17 ENCOUNTER — Other Ambulatory Visit: Payer: Self-pay

## 2024-01-17 ENCOUNTER — Encounter: Payer: Self-pay | Admitting: Physical Medicine and Rehabilitation

## 2024-01-17 ENCOUNTER — Ambulatory Visit (INDEPENDENT_AMBULATORY_CARE_PROVIDER_SITE_OTHER): Admitting: Podiatry

## 2024-01-17 ENCOUNTER — Encounter: Payer: Self-pay | Admitting: Podiatry

## 2024-01-17 ENCOUNTER — Encounter: Attending: Physical Medicine and Rehabilitation | Admitting: Physical Medicine and Rehabilitation

## 2024-01-17 VITALS — Ht 72.0 in | Wt 294.0 lb

## 2024-01-17 DIAGNOSIS — B351 Tinea unguium: Secondary | ICD-10-CM | POA: Diagnosis not present

## 2024-01-17 DIAGNOSIS — M5441 Lumbago with sciatica, right side: Secondary | ICD-10-CM | POA: Insufficient documentation

## 2024-01-17 DIAGNOSIS — G8929 Other chronic pain: Secondary | ICD-10-CM | POA: Insufficient documentation

## 2024-01-17 DIAGNOSIS — M79675 Pain in left toe(s): Secondary | ICD-10-CM | POA: Diagnosis not present

## 2024-01-17 DIAGNOSIS — G894 Chronic pain syndrome: Secondary | ICD-10-CM | POA: Insufficient documentation

## 2024-01-17 DIAGNOSIS — E119 Type 2 diabetes mellitus without complications: Secondary | ICD-10-CM

## 2024-01-17 DIAGNOSIS — M1611 Unilateral primary osteoarthritis, right hip: Secondary | ICD-10-CM | POA: Insufficient documentation

## 2024-01-17 DIAGNOSIS — M79674 Pain in right toe(s): Secondary | ICD-10-CM | POA: Diagnosis not present

## 2024-01-17 MED ORDER — METHOCARBAMOL 500 MG PO TABS
500.0000 mg | ORAL_TABLET | Freq: Three times a day (TID) | ORAL | 3 refills | Status: DC | PRN
  Filled 2024-01-17: qty 90, 30d supply, fill #0

## 2024-01-17 NOTE — Telephone Encounter (Addendum)
 Patient came in the office today and dropped off the parking placard form. Form placed in provider's box.

## 2024-01-17 NOTE — Progress Notes (Signed)
 This patient returns to my office for at risk foot care.  This patient requires this care by a professional since this patient will be at risk due to having diabetes.  Patient hs pain in his achilles tendon right foot after a day of work.  This patient is unable to cut nails himself since the patient cannot reach his nails.These nails are painful walking and wearing shoes.  This patient presents for at risk foot care today.  General Appearance  Alert, conversant and in no acute stress.  Vascular  Dorsalis pedis and posterior tibial  pulses are palpable  bilaterally.  Capillary return is within normal limits  bilaterally. Temperature is within normal limits  bilaterally.  Neurologic  Senn-Weinstein monofilament wire test within normal limits  bilaterally. Muscle power within normal limits bilaterally.  Nails Thick disfigured discolored nails with subungual debris  from hallux to fifth toes bilaterally. No evidence of bacterial infection or drainage bilaterally.  Orthopedic  No limitations of motion  feet .  No crepitus or effusions noted.  No bony pathology or digital deformities noted.  Skin  normotropic skin with no porokeratosis noted bilaterally.  No signs of infections or ulcers noted.     Onychomycosis  Pain in right toes  Pain in left toes  Consent was obtained for treatment procedures.   Mechanical debridement of nails 1-5  bilaterally performed with a nail nipper.  Filed with dremel without incident.      Return office visit   4 months                  Told patient to return for periodic foot care and evaluation due to potential at risk complications.   Helane Gunther DPM

## 2024-01-17 NOTE — Telephone Encounter (Signed)
 Noted

## 2024-01-18 ENCOUNTER — Other Ambulatory Visit: Payer: Self-pay

## 2024-01-18 ENCOUNTER — Ambulatory Visit (HOSPITAL_BASED_OUTPATIENT_CLINIC_OR_DEPARTMENT_OTHER): Attending: Physical Medicine and Rehabilitation | Admitting: Physical Therapy

## 2024-01-18 ENCOUNTER — Encounter (HOSPITAL_BASED_OUTPATIENT_CLINIC_OR_DEPARTMENT_OTHER): Payer: Self-pay | Admitting: Physical Therapy

## 2024-01-18 DIAGNOSIS — M6281 Muscle weakness (generalized): Secondary | ICD-10-CM | POA: Diagnosis present

## 2024-01-18 DIAGNOSIS — M25551 Pain in right hip: Secondary | ICD-10-CM | POA: Insufficient documentation

## 2024-01-18 DIAGNOSIS — R262 Difficulty in walking, not elsewhere classified: Secondary | ICD-10-CM | POA: Diagnosis present

## 2024-01-18 DIAGNOSIS — G8929 Other chronic pain: Secondary | ICD-10-CM | POA: Insufficient documentation

## 2024-01-18 NOTE — Therapy (Signed)
 OUTPATIENT PHYSICAL THERAPY LE TREATMENT   Patient Name: Austin Beasley. MRN: [id number] DOB:[birth year], 43 y.o., male Today's Date: 01/18/2024  END OF SESSION:  PT End of Session - 01/18/24 1617     Visit Number 4    Date for Recertification  02/23/24    Authorization Type [place] mcaid prepay    PT Start Time 1617    PT Stop Time 1658    PT Time Calculation (min) 41 min    Activity Tolerance Patient tolerated treatment well    Behavior During Therapy WFL for tasks assessed/performed          Past Medical History:  Diagnosis Date   Arthritis    right hip   Diabetes mellitus without complication (HCC)    High cholesterol    Hypertension    Obesity    Palpitations    Thyroid  disease    Past Surgical History:  Procedure Laterality Date   ELEVATION OF DEPRESSED SKULL FRACTURE     age 74   LAPAROSCOPIC GASTRIC SLEEVE RESECTION  11/16/2022   TONSILLECTOMY AND ADENOIDECTOMY     as early teenager   Patient Active Problem List   Diagnosis Date Noted   Osteoarthritis of right hip 12/03/2023   Chronic pain syndrome 12/03/2023   Chronic right hip pain 09/27/2023   Chronic bilateral low back pain with right-sided sciatica 09/27/2023   Achilles tendinitis of right lower extremity 06/01/2021   Pain due to onychomycosis of toenails of both feet 02/01/2021   Class 3 severe obesity with serious comorbidity and body mass index (BMI) of 50.0 to 59.9 in adult [facility]) 05/16/2019   Type 2 diabetes mellitus with diabetic neuropathy, without long-term current use of insulin  (HCC) 08/07/2018   Diabetes mellitus without complication (HCC) 05/23/2017    PCP: Haze Servant NP  REFERRING PROVIDER: Emeline Joesph BROCKS, DO   REFERRING DIAG:  [phone] (ICD-10-CM) - Chronic right hip pain  G89.29,M54.41 (ICD-10-CM) - Chronic bilateral low back pain with right-sided sciatica    Rationale for Evaluation and Treatment: Rehabilitation  THERAPY DIAG:  Chronic right hip pain  Muscle  weakness (generalized)  Difficulty in walking, not elsewhere classified  ONSET DATE: 1 year  SUBJECTIVE:                                                                                                                                                                                           SUBJECTIVE STATEMENT: Pt reports pain low today after ms relaxer and tylenol . Did not work today.  Pain after work 10/10  POOL ACCESS: limited access to [employer] (works at [employer])    From initial  evaluation:  I have lost 150 lbs in past 1.5 years.  Pain came on after that.  I was walking 3-5 miles a day to assist with weight loss but had to stop. Had a cortizone shot in my hip and helped some. I expected more. 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 Chronic R hip and low back arhtritis. Aquatherapy referral for offloading joints, strengthening and endurance training to improve ambulatory tolerance.  Stand up to 10 hours day at work pain increases. Take Tylenol .     PAIN:  Are you having pain? no: NPRS scale: current 0/10; worst 9/10; Pain location: .. R hip pain radiating down into the right foot, +numbness/tingling when he is sitting down Pain description: .intermittent, constant, sharp, and aching, stiff from R hip wrapping to his groin--constant but does wax and wane Aggravating factors: SABRASABRAThrough the day; walking, standing after prolonged, first thing in the morning sitting Relieving factors: medication  PRECAUTIONS: None  RED FLAGS: None   WEIGHT BEARING RESTRICTIONS: No  FALLS:  Has patient fallen in last 6 months? 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  DIAGNOSTIC FINDINGS:  R hip x-ray 1/25 Age advanced arthropathy of the right hip with acetabular over coverage and femoral head neck osteophytes. Similar findings are seen to a lesser extent involving the left hip.  PATIENT SURVEYS:  LEFS: 25/80  COGNITION: Overall cognitive status: Within functional limits for tasks assessed     SENSATION: WFL  MUSCLE LENGTH: Hamstrings: tight bilaterally tested in sitting   POSTURE: Pelvis was  even.  Lumbar lordotic curvature was increased .  There was mild dextroscoliosis. right hip in ER  PALPATION: TTP lumbar paraspinals and R PSIS   LOWER EXTREMITY ROM:     Active  Right eval Left eval  Hip flexion 65 wfl  Hip extension Limited by ~50%   Hip abduction Limited by ~75%   Hip adduction Limited by ~75%   Hip internal rotation Limited by ~75%   Hip external rotation    Knee flexion    Knee extension    Ankle dorsiflexion    Ankle plantarflexion    Ankle inversion    Ankle eversion     (Blank rows = not tested)  LOWER EXTREMITY MMT:    MMT Right eval Left eval  Hip flexion 20.9 65.8  Hip extension    Hip abduction 26.2 32.8  Hip adduction    Hip internal rotation    Hip external rotation    Knee flexion    Knee extension    Ankle dorsiflexion    Ankle plantarflexion    Ankle inversion    Ankle eversion     (Blank rows = not tested)  LUMBAR SPECIAL TESTS:  Slump test: Negative  FUNCTIONAL TESTS:  Timed up and go (TUG): 11.10 5 x STS 18.36   4 stage balance :passed GAIT: Distance walked: 500 ft Assistive device utilized: None Level of assistance: Complete Independence Comments: right hip in ER, antalgic limp off loading left  TREATMENT [facility] Adult PT Treatment:  Date: 01/18/24 Pt seen for aquatic therapy today.  Treatment took place in water 3.5-4.75 ft in depth at the [facility]  pool. Temp of water was 91.  Pt entered/exited the pool via stairs in step-to, hop forward with bil rail.  - with UE on yellow hand floats: walking forward/ backward multiple laps, side stepping.  Cues for right hip neutral -side stepping.  (Discomfort right hip with side stepping to left)  - squatted rest period reduces pain - UE on wall: relaxed squat  -white noodle support posteriorly across chest in semi supine/recumbent position: cycling; hip add/abd; hip extension -wrapped anteriorly ue support wall suspended prone for core stretch: hip flex - return to walking backwards/forwards, UE on wall, cues for even step length, reciprocal arm swing    Pt requires the buoyancy and hydrostatic pressure of water for support, and to offload joints by unweighting joint load by at least 50 % in navel deep water and by at least 75-80% in chest to neck deep water.  Viscosity of the water is needed for resistance of strengthening. Water current perturbations provides challenge to standing balance requiring increased core activation.                                                                                                                                  PATIENT EDUCATION:  Education details: intro to aquatic therapy  Person educated: Patient Education method: Programmer, Multimedia, demo  Education comprehension: verbalized understanding, return demo  HOME EXERCISE PROGRAM: TBA  ASSESSMENT:  CLINICAL IMPRESSION: Pt has been to [facility] x 1. He is planning on continuing.  Focused today on right hip ROM and stretching. He continues to have the greatest limitations with right hip abd. It appears to have a hard stop likely due to the osteophytes about the femoral head and neck. R hip extension well tolerated and increases (visually) with repeated stretch. Pain submerged reduced to 0/10.  He is encouraged to visit the [facility] 1 x week in addition to aquatic therapy for enhanced progression to established goals. He  VU    Initial evaluation:  Patient is a 43 y.o. m who was seen today for physical therapy evaluation and treatment for right hip and LBP. Pt with OA as per x-rays right hip likely causing radiating pain into rle and possibly in lumbar spine.  He presents with pain limited deficits in          , ROM, endurance, activity tolerance particularly standing, gait, balance, and functional mobility with ADL's. 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  As indicated by subjective information, objective measures and his outcome measure score he is limited in all functional mobility and ADL's. He will benefit from skilled PT intervention to improve all deficits.  Initial plan is to progress with aquatics. Will transition onto land as approp.      OBJECTIVE IMPAIRMENTS:  Abnormal gait, decreased activity tolerance, decreased balance, decreased mobility, difficulty walking, decreased ROM, decreased strength, obesity, and pain.   ACTIVITY LIMITATIONS: lifting, bending, sitting, standing, squatting, sleeping, stairs, transfers, and locomotion level  PARTICIPATION LIMITATIONS: community activity, occupation, and yard work  PERSONAL FACTORS: Age, Fitness, and 1 comorbidity: obesity are also affecting patient's functional outcome.   REHAB POTENTIAL: Good  CLINICAL DECISION MAKING: Stable/uncomplicated  EVALUATION COMPLEXITY: Low   GOALS: Goals reviewed with patient? Yes  SHORT TERM GOALS: Target date: 01/06/24  Pt will tolerate full aquatic sessions consistently without increase in pain and with improving function to demonstrate good toleration and effectiveness of intervention.  Baseline: Goal status: Met 01/18/24  2.  Pt will consider gaining pool access for use of the properties of water for chronic conditions maintaining mobility and minimizing pain.  Baseline:  Goal status: In progress 01/18/24  3.  Pt will report decrease in pain submerged to  demonstrate one of the advantages of exercise in aquatics using the properties of water. Baseline:  Goal status: Met 01/18/24    LONG TERM GOALS: Target date: 02/23/24  Pt to improve on LEFS by at least 9 point to demonstrate statistically significant Improvement in function. Baseline: 25/80 Goal status: INITIAL  2.  Pt will report decrease in pain by at least 50% for improved toleration to activity/quality of life and to demonstrate improved management of pain. Baseline:  Goal status: INITIAL  3.  Pt will improve R hip strength flex by at least 10 lbs to demonstrate improved overall physical function Baseline:  Goal status: INITIAL  4.  Pt will report return to consistent exercise regimen for overall wellness. Baseline:  Goal status: INITIAL  5.  Pt will improve ROM of R hip by 25% to improve function, decrease risk of injury and pain and improve quality of life Baseline:  Goal status: INITIAL  6.  Pt will be indep with final HEP's (land and aquatic as appropriate) for continued management of condition Baseline:  Goal status: INITIAL  PLAN:  PT FREQUENCY: 1-2x/week  PT DURATION: 8 weeks  PLANNED INTERVENTIONS: 97164- PT Re-evaluation, 97110-Therapeutic exercises, 97530- Therapeutic activity, 97112- Neuromuscular re-education, 97535- Self Care, [id number]- Manual therapy, [id number]- Gait training, [phone]- Aquatic Therapy, [phone] (1-2 muscles), [id number] (3+ muscles)- Dry Needling, Patient/Family education, Balance training, Stair training, Taping, Joint mobilization, DME instructions, Cryotherapy, and Moist heat.  PLAN FOR NEXT SESSION: aquatic: right hip and LB ROM strengthening and pain management.  Balance and proprioception re-training  Ronal Foots) Dodge Ator MPT 01/18/24 5:01 PM [facility] [address] [place], [place], [address] Phone: [phone]   Fax:  [phone]    For all possible CPT codes, reference the Planned  Interventions line above.     Check all conditions that are expected to impact treatment: {Conditions expected to impact treatment:Morbid obesity and Musculoskeletal disorders   If treatment provided at initial evaluation, no treatment charged due to lack of authorization.

## 2024-01-23 ENCOUNTER — Telehealth: Payer: Self-pay

## 2024-01-23 NOTE — Telephone Encounter (Signed)
 Called patient twice, unable to make contact or leave voicemail  Mychart message sent

## 2024-01-25 ENCOUNTER — Encounter (HOSPITAL_BASED_OUTPATIENT_CLINIC_OR_DEPARTMENT_OTHER): Payer: Self-pay | Admitting: Physical Therapy

## 2024-01-25 ENCOUNTER — Ambulatory Visit (HOSPITAL_BASED_OUTPATIENT_CLINIC_OR_DEPARTMENT_OTHER): Admitting: Physical Therapy

## 2024-01-25 DIAGNOSIS — M25551 Pain in right hip: Secondary | ICD-10-CM | POA: Diagnosis not present

## 2024-01-25 DIAGNOSIS — R262 Difficulty in walking, not elsewhere classified: Secondary | ICD-10-CM

## 2024-01-25 DIAGNOSIS — M6281 Muscle weakness (generalized): Secondary | ICD-10-CM

## 2024-01-25 DIAGNOSIS — G8929 Other chronic pain: Secondary | ICD-10-CM

## 2024-01-25 NOTE — Therapy (Signed)
 OUTPATIENT PHYSICAL THERAPY LE TREATMENT   Patient Name: Austin Beasley. MRN: [id number] DOB:[birth year], 43 y.o., male Today's Date: 01/25/2024  END OF SESSION:  PT End of Session - 01/25/24 1614     Visit Number 5    Date for Recertification  02/23/24    Authorization Type [place] mcaid prepay    PT Start Time 1616    PT Stop Time 1655    PT Time Calculation (min) 39 min    Activity Tolerance Patient tolerated treatment well    Behavior During Therapy WFL for tasks assessed/performed          Past Medical History:  Diagnosis Date   Arthritis    right hip   Diabetes mellitus without complication (HCC)    High cholesterol    Hypertension    Obesity    Palpitations    Thyroid  disease    Past Surgical History:  Procedure Laterality Date   ELEVATION OF DEPRESSED SKULL FRACTURE     age 76   LAPAROSCOPIC GASTRIC SLEEVE RESECTION  11/16/2022   TONSILLECTOMY AND ADENOIDECTOMY     as early teenager   Patient Active Problem List   Diagnosis Date Noted   Osteoarthritis of right hip 12/03/2023   Chronic pain syndrome 12/03/2023   Chronic right hip pain 09/27/2023   Chronic bilateral low back pain with right-sided sciatica 09/27/2023   Achilles tendinitis of right lower extremity 06/01/2021   Pain due to onychomycosis of toenails of both feet 02/01/2021   Class 3 severe obesity with serious comorbidity and body mass index (BMI) of 50.0 to 59.9 in adult (HCC) 05/16/2019   Type 2 diabetes mellitus with diabetic neuropathy, without long-term current use of insulin  (HCC) 08/07/2018   Diabetes mellitus without complication (HCC) 05/23/2017    PCP: Haze Servant NP  REFERRING PROVIDER: Emeline Joesph BROCKS, DO   REFERRING DIAG:  [phone] (ICD-10-CM) - Chronic right hip pain  G89.29,M54.41 (ICD-10-CM) - Chronic bilateral low back pain with right-sided sciatica    Rationale for Evaluation and Treatment: Rehabilitation  THERAPY DIAG:  Chronic right hip  pain  Difficulty in walking, not elsewhere classified  Muscle weakness (generalized)  ONSET DATE: 1 year  SUBJECTIVE:                                                                                                                                                                                           SUBJECTIVE STATEMENT: Pt reports having not worked all week and feeling pretty good.  Did take some tylenol  prior to session as pain was a 3/1- now 1/10.  Have to work a 10 hour day  on Sat  POOL ACCESS: limited access to [facility] (works at [employer])    From initial evaluation:  I have lost 150 lbs in past 1.5 years.  Pain came on after that.  I was walking 3-5 miles a day to assist with weight loss but had to stop. Had a cortizone shot in my hip and helped some. I expected more. 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 Chronic R hip and low back arhtritis. Aquatherapy referral for offloading joints, strengthening and endurance training to improve ambulatory tolerance.  Stand up to 10 hours day at work pain increases. Take Tylenol .     PAIN:  Are you having pain? no: NPRS scale: current 0/10; worst 9/10; Pain location: .. R hip pain radiating down into the right foot, +numbness/tingling when he is sitting down Pain description: .intermittent, constant, sharp, and aching, stiff from R hip wrapping to his groin--constant but does wax and wane Aggravating factors: SABRASABRAThrough the day; walking, standing after prolonged, first thing in the morning sitting Relieving factors: medication  PRECAUTIONS: None  RED FLAGS: None   WEIGHT BEARING RESTRICTIONS: No  FALLS:  Has patient fallen in last 6 months? 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  DIAGNOSTIC FINDINGS:  R hip x-ray 1/25 Age advanced arthropathy of the right hip with acetabular over coverage and femoral head neck osteophytes. Similar findings are seen to a lesser extent involving the left hip.  PATIENT SURVEYS:  LEFS: 25/80  COGNITION: Overall cognitive status: Within functional limits for tasks assessed     SENSATION: WFL  MUSCLE LENGTH: Hamstrings: tight bilaterally tested in sitting   POSTURE: Pelvis was  even.  Lumbar lordotic curvature was increased .  There was mild dextroscoliosis. right hip in ER  PALPATION: TTP lumbar paraspinals and R PSIS   LOWER EXTREMITY ROM:     Active  Right eval Left eval  Hip flexion 65 wfl  Hip extension Limited by ~50%   Hip abduction Limited by ~75%   Hip adduction Limited by ~75%   Hip internal rotation Limited by ~75%   Hip external rotation    Knee flexion    Knee extension    Ankle dorsiflexion    Ankle plantarflexion    Ankle inversion    Ankle eversion     (Blank rows = not tested)  LOWER EXTREMITY MMT:    MMT Right eval Left eval  Hip flexion 20.9 65.8  Hip extension    Hip abduction 26.2 32.8  Hip adduction    Hip internal rotation    Hip external rotation    Knee flexion    Knee extension    Ankle dorsiflexion    Ankle plantarflexion    Ankle inversion    Ankle eversion     (Blank rows = not tested)  LUMBAR SPECIAL TESTS:  Slump test: Negative  FUNCTIONAL TESTS:  Timed up and go (TUG): 11.10 5 x STS 18.36   4 stage balance :passed GAIT: Distance walked: 500 ft Assistive device utilized: None Level of assistance: Complete Independence Comments: right hip in ER, antalgic limp off loading left  TREATMENT [facility] Adult PT Treatment:                                             Date: 01/25/24 Pt seen for aquatic therapy today.   Treatment took place in water 3.5-4.75 ft in depth at the [facility] pool. Temp of water was 91.  Pt entered/exited the pool via stairs in step-to, hop forward with bil rail.  - with UE on yellow hand floats: walking forward/ backward multiple laps, side stepping.   -side stepping.  (Discomfort right hip with side stepping to left continues but tolerable)->ue support wall with slight side lunge  - squatted rest period reduces pain - UE on wall: relaxed squat  -white noodle support posteriorly across chest additional yellow noodle under hips in semi supine/recumbent position: hip extension  - just white noodle: cycling; hip add/abd; clam shell -[facility] R/L hip IR and ER -open book/closed book for thoracis spine stretch -L stretch-> hip hiking - Walking between exercises for recovery between exercises    Pt requires the buoyancy and hydrostatic pressure of water for support, and to offload joints by unweighting joint load by at least 50 % in navel deep water and by at least 75-80% in chest to neck deep water.  Viscosity of the water is needed for resistance of strengthening. Water current perturbations provides challenge to standing balance requiring increased core activation.                                                                                                                                  PATIENT EDUCATION:  Education details: intro to aquatic therapy  Person educated: Patient Education method: Programmer, Multimedia, demo  Education comprehension: verbalized understanding, return demo  HOME EXERCISE PROGRAM: TBA  ASSESSMENT:  CLINICAL IMPRESSION: Supine suspended hip abd visually appears improved today.  He does report he has no pain with hip abd in this position. Progressed ROM and stretching with r hip in all planes as well as lumbosacral area.  Difficulty and some discomfort with [facility] IR and active abd. Very good toleration to session. Goals ongoing       Initial  evaluation:  Patient is a 43 y.o. m who was seen today for physical therapy evaluation and treatment for right hip and LBP. Pt with OA as per x-rays right hip likely causing radiating pain into rle and possibly in lumbar spine.  He presents with pain limited deficits in          , ROM, endurance, activity tolerance particularly standing, gait, balance, and functional mobility with ADL's. 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  As indicated by subjective information, objective measures and his outcome measure score he is limited in all functional mobility and ADL's. He  will benefit from skilled PT intervention to improve all deficits.  Initial plan is to progress with aquatics. Will transition onto land as approp.      OBJECTIVE IMPAIRMENTS: Abnormal gait, decreased activity tolerance, decreased balance, decreased mobility, difficulty walking, decreased ROM, decreased strength, obesity, and pain.   ACTIVITY LIMITATIONS: lifting, bending, sitting, standing, squatting, sleeping, stairs, transfers, and locomotion level  PARTICIPATION LIMITATIONS: community activity, occupation, and yard work  PERSONAL FACTORS: Age, Fitness, and 1 comorbidity: obesity are also affecting patient's functional outcome.   REHAB POTENTIAL: Good  CLINICAL DECISION MAKING: Stable/uncomplicated  EVALUATION COMPLEXITY: Low   GOALS: Goals reviewed with patient? Yes  SHORT TERM GOALS: Target date: 01/06/24  Pt will tolerate full aquatic sessions consistently without increase in pain and with improving function to demonstrate good toleration and effectiveness of intervention.  Baseline: Goal status: Met 01/18/24  2.  Pt will consider gaining pool access for use of the properties of water for chronic conditions maintaining mobility and minimizing pain.  Baseline:  Goal status: In progress 01/18/24  3.  Pt will report decrease in pain submerged to demonstrate one of  the advantages of exercise in aquatics using the properties of water. Baseline:  Goal status: Met 01/18/24    LONG TERM GOALS: Target date: 02/23/24  Pt to improve on LEFS by at least 9 point to demonstrate statistically significant Improvement in function. Baseline: 25/80 Goal status: INITIAL  2.  Pt will report decrease in pain by at least 50% for improved toleration to activity/quality of life and to demonstrate improved management of pain. Baseline:  Goal status: INITIAL  3.  Pt will improve R hip strength flex by at least 10 lbs to demonstrate improved overall physical function Baseline:  Goal status: INITIAL  4.  Pt will report return to consistent exercise regimen for overall wellness. Baseline:  Goal status: INITIAL  5.  Pt will improve ROM of R hip by 25% to improve function, decrease risk of injury and pain and improve quality of life Baseline:  Goal status: INITIAL  6.  Pt will be indep with final HEP's (land and aquatic as appropriate) for continued management of condition Baseline:  Goal status: INITIAL  PLAN:  PT FREQUENCY: 1-2x/week  PT DURATION: 8 weeks  PLANNED INTERVENTIONS: 97164- PT Re-evaluation, 97110-Therapeutic exercises, 97530- Therapeutic activity, 97112- Neuromuscular re-education, 97535- Self Care, 02859- Manual therapy, [id number]- Gait training, [phone]- Aquatic Therapy, [phone] (1-2 muscles), 20561 (3+ muscles)- Dry Needling, Patient/Family education, Balance training, Stair training, Taping, Joint mobilization, DME instructions, Cryotherapy, and Moist heat.  PLAN FOR NEXT SESSION: aquatic: right hip and LB ROM strengthening and pain management.  Balance and proprioception re-training  Ronal Foots) Jesslyn Viglione MPT 01/25/24 4:31 PM [facility] [address] [place], [place], [address] Phone: [phone]   Fax:  [phone]    For all possible CPT codes, reference the Planned Interventions line  above.     Check all conditions that are expected to impact treatment: {Conditions expected to impact treatment:Morbid obesity and Musculoskeletal disorders   If treatment provided at initial evaluation, no treatment charged due to lack of authorization.

## 2024-01-29 ENCOUNTER — Other Ambulatory Visit: Payer: Self-pay

## 2024-01-29 ENCOUNTER — Encounter: Payer: Self-pay | Admitting: Nurse Practitioner

## 2024-01-29 ENCOUNTER — Ambulatory Visit: Attending: Nurse Practitioner | Admitting: Nurse Practitioner

## 2024-01-29 VITALS — BP 111/64 | HR 60 | Resp 19 | Ht 72.0 in | Wt 299.9 lb

## 2024-01-29 DIAGNOSIS — E119 Type 2 diabetes mellitus without complications: Secondary | ICD-10-CM

## 2024-01-29 DIAGNOSIS — Z23 Encounter for immunization: Secondary | ICD-10-CM

## 2024-01-29 DIAGNOSIS — I10 Essential (primary) hypertension: Secondary | ICD-10-CM | POA: Diagnosis not present

## 2024-01-29 DIAGNOSIS — E039 Hypothyroidism, unspecified: Secondary | ICD-10-CM

## 2024-01-29 DIAGNOSIS — G8929 Other chronic pain: Secondary | ICD-10-CM | POA: Diagnosis not present

## 2024-01-29 DIAGNOSIS — K2100 Gastro-esophageal reflux disease with esophagitis, without bleeding: Secondary | ICD-10-CM

## 2024-01-29 DIAGNOSIS — Z7985 Long-term (current) use of injectable non-insulin antidiabetic drugs: Secondary | ICD-10-CM | POA: Diagnosis not present

## 2024-01-29 DIAGNOSIS — M5441 Lumbago with sciatica, right side: Secondary | ICD-10-CM | POA: Diagnosis not present

## 2024-01-29 DIAGNOSIS — E785 Hyperlipidemia, unspecified: Secondary | ICD-10-CM | POA: Diagnosis not present

## 2024-01-29 MED ORDER — SEMAGLUTIDE (1 MG/DOSE) 4 MG/3ML ~~LOC~~ SOPN
1.0000 mg | PEN_INJECTOR | SUBCUTANEOUS | 1 refills | Status: AC
  Filled 2024-01-29 – 2024-02-10 (×2): qty 3, 28d supply, fill #0
  Filled 2024-03-13: qty 3, 28d supply, fill #1

## 2024-01-29 MED ORDER — FENOFIBRATE 48 MG PO TABS
48.0000 mg | ORAL_TABLET | Freq: Every day | ORAL | 1 refills | Status: AC
  Filled 2024-01-29 – 2024-03-29 (×2): qty 90, 90d supply, fill #0

## 2024-01-29 MED ORDER — OMEPRAZOLE 40 MG PO CPDR
40.0000 mg | DELAYED_RELEASE_CAPSULE | Freq: Every day | ORAL | 1 refills | Status: AC
  Filled 2024-01-29 – 2024-03-02 (×3): qty 90, 90d supply, fill #0

## 2024-01-29 MED ORDER — METHOCARBAMOL 750 MG PO TABS
750.0000 mg | ORAL_TABLET | Freq: Three times a day (TID) | ORAL | 1 refills | Status: AC | PRN
  Filled 2024-01-29: qty 60, 20d supply, fill #0

## 2024-01-29 MED ORDER — LEVOTHYROXINE SODIUM 150 MCG PO TABS
150.0000 ug | ORAL_TABLET | Freq: Every day | ORAL | 0 refills | Status: AC
  Filled 2024-01-29 – 2024-02-10 (×2): qty 90, 90d supply, fill #0

## 2024-01-29 NOTE — Progress Notes (Signed)
 "  Assessment & Plan:  [name] was seen today for diabetes.  Diagnoses and all orders for this visit:  Diabetes mellitus treated with injections of non-insulin  medication  Increase ozempic  to 1 mg -     Urine Albumin/Creatinine with ratio (send out) [LAB689] -     Hemoglobin A1c -     CMP14+EGFR -     Semaglutide , 1 MG/DOSE, 4 MG/3ML SOPN; Inject 1 mg as directed once a week. Continue blood sugar control as discussed in office today, low carbohydrate diet, and regular physical exercise as tolerated, 150 minutes per week (30 min each day, 5 days per week, or 50 min 3 days per week). Keep blood sugar logs with fasting goal of 90-130 mg/dl, post prandial (after you eat) less than 180.  For Hypoglycemia: BS <60 and Hyperglycemia BS >400; contact the clinic ASAP. Annual eye exams and foot exams are recommended.   Need for influenza vaccination -     Flu vaccine trivalent PF, 6mos and older(Flulaval,Afluria,Fluarix,Fluzone)  Dyslipidemia, goal LDL below 70 -     fenofibrate  (TRICOR ) 48 MG tablet; Take 1 tablet (48 mg total) by mouth daily. INSTRUCTIONS: Work on a low fat, heart healthy diet and participate in regular aerobic exercise program by working out at least 150 minutes per week; 5 days a week-30 minutes per day. Avoid red meat/beef/steak,  fried foods. junk foods, sodas, sugary drinks, unhealthy snacking, alcohol and smoking.  Drink at least 80 oz of water per day and monitor your carbohydrate intake daily.    Hypothyroidism, unspecified type -     levothyroxine  (SYNTHROID ) 150 MCG tablet; Take 1 tablet (150 mcg total) by mouth daily. Lab Results  Component Value Date   TSH 2.430 03/24/2023   T4TOTAL 12.2 (H) 03/24/2023   FREET4 1.29 06/16/2009     Gastroesophageal reflux disease with esophagitis without hemorrhage -     omeprazole  (PRILOSEC) 40 MG capsule; Take 1 capsule (40 mg total) by mouth daily. INSTRUCTIONS: Avoid GERD Triggers: acidic, spicy or fried foods,  caffeine, coffee, sodas,  alcohol and chocolate.    Chronic bilateral low back pain with right-sided sciatica -     methocarbamol  (ROBAXIN ) 750 MG tablet; Take 1 tablet (750 mg total) by mouth every 8 (eight) hours as needed for muscle spasms. Work on losing weight to help reduce back pain. May alternate with heat and ice application for pain relief. May also alternate with acetaminophen  and Ibuprofen  gel as prescribed for back pain. Other alternatives include massage, acupuncture and water aerobics.  You must stay active and avoid a sedentary lifestyle.      Patient has been counseled on age-appropriate routine health concerns for screening and prevention. These are reviewed and up-to-date. Referrals have been placed accordingly. Immunizations are up-to-date or declined.    Subjective:   Chief Complaint  Patient presents with   Diabetes   History of Present Illness  Austin Beasley. Austin Beasley is a 43 year old male who presents for follow up to DM,HTN,  back pain   He has a past medical history of OA right hip, left shoulder,DM2, High cholesterol, Hypertension, morbid obesity status post gastric sleeve, Palpitations, tobacco dependence, hemorrhoids and Thyroid  disease.    Joint PAIN He is experiencing inadequate pain control  for his back and hip with current medication regimen. He has been taking methocarbamol  500 mg, which has not been effective. Previously, he was on Flexeril , but it caused excessive drowsiness, prompting the switch to methocarbamol . Despite  the change, he has not noticed any improvement.   He is also dealing with significant personal stressors, including his mother-in-law's recent cancer diagnosis and the financial and logistical challenges of supporting his family, including his grandson and another grandchild on the way.  DM  A1c at goal. He is concerned that his weight has been stagnant. He is currently administering ozempic  0.5 mg weekly. He is facing challenges  with weight management, currently weighing 299 pounds. He attributes his weight gain to a lack of physical activity. He reports eating regularly but not engaging in physical activity as he used to. Lab Results  Component Value Date   HGBA1C 5.3 01/29/2024  LDL at goal with  Lab Results  Component Value Date   LDLCALC 56 09/01/2021     HTN Blood pressure is well controlled. He is currently taking atenolol  50 mg daily,  BP Readings from Last 3 Encounters:  01/29/24 111/64  11/29/23 111/72  09/27/23 104/66     Review of Systems  Constitutional:  Negative for fever, malaise/fatigue and weight loss.  HENT: Negative.  Negative for nosebleeds.   Eyes: Negative.  Negative for blurred vision, double vision and photophobia.  Respiratory: Negative.  Negative for cough and shortness of breath.   Cardiovascular: Negative.  Negative for chest pain, palpitations and leg swelling.  Gastrointestinal:  Positive for heartburn. Negative for nausea and vomiting.  Musculoskeletal:  Positive for back pain, joint pain and myalgias.  Neurological: Negative.  Negative for dizziness, focal weakness, seizures and headaches.  Psychiatric/Behavioral: Negative.  Negative for suicidal ideas.     Past Medical History:  Diagnosis Date   Arthritis    right hip   Diabetes mellitus without complication (HCC)    High cholesterol    Hypertension    Obesity    Palpitations    Thyroid  disease     Past Surgical History:  Procedure Laterality Date   ELEVATION OF DEPRESSED SKULL FRACTURE     age 39   LAPAROSCOPIC GASTRIC SLEEVE RESECTION  11/16/2022   TONSILLECTOMY AND ADENOIDECTOMY     as early teenager    Family History  Problem Relation Age of Onset   Breast cancer Mother    Hypertension Father    Breast cancer Sister    Birth defects Maternal Grandmother    Breast cancer Maternal Grandmother    Birth defects Maternal Grandfather    Birth defects Paternal Grandmother    Birth defects Paternal  Grandfather    Colon cancer Paternal Grandfather    Esophageal cancer Neg Hx    Rectal cancer Neg Hx    Stomach cancer Neg Hx     Social History Reviewed with no changes to be made today.   Outpatient Medications Prior to Visit  Medication Sig Dispense Refill   Accu-Chek Softclix Lancets lancets Use to check blood sugar 1 times daily. 100 each 6   acetaminophen -codeine  (TYLENOL  #3) 300-30 MG tablet Take 1 tablet by mouth.     atenolol  (TENORMIN ) 50 MG tablet Take 1 tablet (50 mg total) by mouth daily. 90 tablet 1   Blood Glucose Monitoring Suppl (ACCU-CHEK GUIDE) w/Device KIT Use to check blood sugar 1 times daily. 1 kit 0   buPROPion  (WELLBUTRIN  XL) 150 MG 24 hr tablet Take 1 tablet (150 mg total) by mouth daily. Take with 300 mg Wellbutrin  to make 450mg  90 tablet 3   buPROPion  (WELLBUTRIN  XL) 300 MG 24 hr tablet Take 1 tablet (300 mg total) by mouth every morning. Take with  150 mg Wellbutrin  to make 450mg  90 tablet 3   Calcium Citrate-Vitamin D (CALCIUM CITRATE + PO) Take 1 tablet by mouth.     diclofenac  Sodium (VOLTAREN ) 1 % GEL Apply 4 g topically 4 (four) times daily. 200 g 3   diltiazem  2 % GEL Apply pea sized amount to anus twice daily for 6 weeks 30 g 1   fluticasone  (FLONASE ) 50 MCG/ACT nasal spray Place 2 sprays into both nostrils daily. 16 g 0   glucose blood (ACCU-CHEK GUIDE) test strip Use to check blood sugar 1 times daily. 100 each 6   hydrocortisone  (ANUSOL -HC) 2.5 % rectal cream Place 1 Application rectally 2 (two) times daily. 30 g 3   Multiple Vitamin tablet Take 1 tablet by mouth daily.     mupirocin  2% oint-hydrocortisone  2.5% cream-nystatin cream-zinc oxide 13% oint 1:1:1:5 mixture SMARTSIG:Topical     Nitroglycerin  (RECTIV ) 0.4 % OINT Apply pea sized amount to rectal area twice a day for 6 weeks 30 g 1   senna-docusate (SENOKOT-S) 8.6-50 MG tablet Take 2 tablets by mouth 2 (two) times daily as needed for mild constipation or moderate constipation. 100 tablet 6    VITAMIN D PO Take 1 capsule by mouth.     fenofibrate  (TRICOR ) 48 MG tablet Take 1 tablet (48 mg total) by mouth daily. 90 tablet 1   levothyroxine  (SYNTHROID ) 150 MCG tablet Take 1 tablet (150 mcg total) by mouth daily. 90 tablet 0   methocarbamol  (ROBAXIN ) 500 MG tablet Take 1 tablet (500 mg total) by mouth every 8 (eight) hours as needed for muscle spasms. 90 tablet 3   omeprazole  (PRILOSEC) 40 MG capsule Take 1 capsule (40 mg total) by mouth daily. 90 capsule 0   Semaglutide ,0.25 or 0.5MG /DOS, (OZEMPIC , 0.25 OR 0.5 MG/DOSE,) 2 MG/3ML SOPN Inject 0.5 mg into the skin once a week. 3 mL 0   No facility-administered medications prior to visit.    No Known Allergies     Objective:    BP 111/64 (BP Location: Left Arm, Patient Position: Sitting, Cuff Size: Large)   Pulse 60   Resp 19   Ht 6' (1.829 m)   Wt 299 lb 14.4 oz (136 kg)   SpO2 98%   BMI 40.67 kg/m  Wt Readings from Last 3 Encounters:  01/29/24 299 lb 14.4 oz (136 kg)  01/17/24 294 lb (133.4 kg)  11/29/23 286 lb (129.7 kg)    Physical Exam Vitals and nursing note reviewed.  Constitutional:      Appearance: He is well-developed.  HENT:     Head: Normocephalic and atraumatic.  Cardiovascular:     Rate and Rhythm: Normal rate and regular rhythm.     Heart sounds: Normal heart sounds. No murmur heard.    No friction rub. No gallop.  Pulmonary:     Effort: Pulmonary effort is normal. No tachypnea or respiratory distress.     Breath sounds: Normal breath sounds. No decreased breath sounds, wheezing, rhonchi or rales.  Chest:     Chest wall: No tenderness.  Musculoskeletal:        General: Normal range of motion.     Cervical back: Normal range of motion.  Skin:    General: Skin is warm and dry.  Neurological:     Mental Status: He is alert and oriented to person, place, and time.     Coordination: Coordination normal.  Psychiatric:        Behavior: Behavior normal. Behavior is cooperative.  Thought Content:  Thought content normal.        Judgment: Judgment normal.          Patient has been counseled extensively about nutrition and exercise as well as the importance of adherence with medications and regular follow-up. The patient was given clear instructions to go to ER or return to medical center if symptoms don't improve, worsen or new problems develop. The patient verbalized understanding.   Follow-up: Return in about 19 weeks (around 06/10/2024).   Haze LELON Servant, FNP-BC [facility] [place], [place] [phone]   02/27/2024, 8:16 PM "

## 2024-01-31 ENCOUNTER — Ambulatory Visit (HOSPITAL_BASED_OUTPATIENT_CLINIC_OR_DEPARTMENT_OTHER): Admitting: Physical Therapy

## 2024-01-31 LAB — HEMOGLOBIN A1C
Est. average glucose Bld gHb Est-mCnc: 105 mg/dL
Hgb A1c MFr Bld: 5.3 % (ref 4.8–5.6)

## 2024-01-31 LAB — CMP14+EGFR
ALT: 26 IU/L (ref 0–44)
AST: 25 IU/L (ref 0–40)
Albumin: 4.3 g/dL (ref 4.1–5.1)
Alkaline Phosphatase: 47 IU/L (ref 47–123)
BUN/Creatinine Ratio: 11 (ref 9–20)
BUN: 15 mg/dL (ref 6–24)
Bilirubin Total: 0.5 mg/dL (ref 0.0–1.2)
CO2: 24 mmol/L (ref 20–29)
Calcium: 10 mg/dL (ref 8.7–10.2)
Chloride: 104 mmol/L (ref 96–106)
Creatinine, Ser: 1.39 mg/dL — ABNORMAL HIGH (ref 0.76–1.27)
Globulin, Total: 2.6 g/dL (ref 1.5–4.5)
Glucose: 61 mg/dL — ABNORMAL LOW (ref 70–99)
Potassium: 4.8 mmol/L (ref 3.5–5.2)
Sodium: 142 mmol/L (ref 134–144)
Total Protein: 6.9 g/dL (ref 6.0–8.5)
eGFR: 64 mL/min/1.73 (ref >59)

## 2024-01-31 LAB — MICROALBUMIN / CREATININE URINE RATIO
Creatinine, Urine: 212.1 mg/dL
Microalb/Creat Ratio: 3 mg/g{creat} (ref 0–29)
Microalbumin, Urine: 5.9 ug/mL

## 2024-02-04 ENCOUNTER — Ambulatory Visit: Payer: Self-pay | Admitting: Nurse Practitioner

## 2024-02-07 ENCOUNTER — Ambulatory Visit (HOSPITAL_BASED_OUTPATIENT_CLINIC_OR_DEPARTMENT_OTHER): Attending: Physical Medicine and Rehabilitation | Admitting: Physical Therapy

## 2024-02-07 ENCOUNTER — Encounter (HOSPITAL_BASED_OUTPATIENT_CLINIC_OR_DEPARTMENT_OTHER): Payer: Self-pay | Admitting: Physical Therapy

## 2024-02-07 DIAGNOSIS — M25551 Pain in right hip: Secondary | ICD-10-CM | POA: Diagnosis present

## 2024-02-07 DIAGNOSIS — G8929 Other chronic pain: Secondary | ICD-10-CM | POA: Insufficient documentation

## 2024-02-07 DIAGNOSIS — M6281 Muscle weakness (generalized): Secondary | ICD-10-CM | POA: Insufficient documentation

## 2024-02-07 DIAGNOSIS — R262 Difficulty in walking, not elsewhere classified: Secondary | ICD-10-CM | POA: Insufficient documentation

## 2024-02-07 DIAGNOSIS — M5459 Other low back pain: Secondary | ICD-10-CM | POA: Diagnosis present

## 2024-02-07 NOTE — Therapy (Signed)
 OUTPATIENT PHYSICAL THERAPY LE TREATMENT   Patient Name: Austin Beasley. MRN: [id number] DOB:1981/01/19, 43 y.o., male Today's Date: 02/07/2024  END OF SESSION:  PT End of Session - 02/07/24 1608     Visit Number 6    Date for Recertification  02/23/24    Authorization Type [place] mcaid prepay    PT Start Time 1610    PT Stop Time 1650    PT Time Calculation (min) 40 min    Activity Tolerance Patient tolerated treatment well    Behavior During Therapy WFL for tasks assessed/performed           Past Medical History:  Diagnosis Date   Arthritis    right hip   Diabetes mellitus without complication (HCC)    High cholesterol    Hypertension    Obesity    Palpitations    Thyroid  disease    Past Surgical History:  Procedure Laterality Date   ELEVATION OF DEPRESSED SKULL FRACTURE     age 45   LAPAROSCOPIC GASTRIC SLEEVE RESECTION  11/16/2022   TONSILLECTOMY AND ADENOIDECTOMY     as early teenager   Patient Active Problem List   Diagnosis Date Noted   Osteoarthritis of right hip 12/03/2023   Chronic pain syndrome 12/03/2023   Chronic right hip pain 09/27/2023   Chronic bilateral low back pain with right-sided sciatica 09/27/2023   Achilles tendinitis of right lower extremity 06/01/2021   Pain due to onychomycosis of toenails of both feet 02/01/2021   Class 3 severe obesity with serious comorbidity and body mass index (BMI) of 50.0 to 59.9 in adult (HCC) 05/16/2019   Type 2 diabetes mellitus with diabetic neuropathy, without long-term current use of insulin  (HCC) 08/07/2018   Diabetes mellitus without complication (HCC) 05/23/2017    PCP: Haze Servant NP  REFERRING PROVIDER: Emeline Joesph BROCKS, DO   REFERRING DIAG:  [phone] (ICD-10-CM) - Chronic right hip pain  G89.29,M54.41 (ICD-10-CM) - Chronic bilateral low back pain with right-sided sciatica    Rationale for Evaluation and Treatment: Rehabilitation  THERAPY DIAG:  Chronic right hip pain  Muscle  weakness (generalized)  Other low back pain  ONSET DATE: 1 year  SUBJECTIVE:                                                                                                                                                                                           SUBJECTIVE STATEMENT: Pt reports having pulled something yesterday leaning over to pick something off the floor.  Woke with pain high and worked for 7 hours today arriving at 10/10 pain in LB and hip.  POOL  ACCESS: limited access to [facility] (works at [employer])    From initial evaluation:  I have lost 150 lbs in past 1.5 years.  Pain came on after that.  I was walking 3-5 miles a day to assist with weight loss but had to stop. Had a cortizone shot in my hip and helped some. I expected more. 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 Chronic R hip and low back arhtritis. Aquatherapy referral for offloading joints, strengthening and endurance training to improve ambulatory tolerance.  Stand up to 10 hours day at work pain increases. Take Tylenol .     PAIN:  Are you having pain? no: NPRS scale: current 0/10; worst 9/10; Pain location: .. R hip pain radiating down into the right foot, +numbness/tingling when he is sitting down Pain description: .intermittent, constant, sharp, and aching, stiff from R hip wrapping to his groin--constant but does wax and wane Aggravating factors: SABRASABRAThrough the day; walking, standing after prolonged, first thing in the morning sitting Relieving factors: medication  PRECAUTIONS: None  RED FLAGS: None   WEIGHT BEARING RESTRICTIONS: No  FALLS:  Has patient fallen in last 6 months? 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  DIAGNOSTIC FINDINGS:  R hip x-ray 1/25 Age advanced arthropathy of the right hip with acetabular over coverage and femoral head neck osteophytes. Similar findings are seen to a lesser extent involving the left hip.  PATIENT SURVEYS:  LEFS: 25/80  COGNITION: Overall cognitive status: Within functional limits for tasks assessed     SENSATION: WFL  MUSCLE LENGTH: Hamstrings: tight bilaterally tested in sitting   POSTURE: Pelvis was  even.  Lumbar lordotic curvature was increased .  There was mild dextroscoliosis. right hip in ER  PALPATION: TTP lumbar paraspinals and R PSIS   LOWER EXTREMITY ROM:     Active  Right eval Left eval  Hip flexion 65 wfl  Hip extension Limited by ~50%   Hip abduction Limited by ~75%   Hip adduction Limited by ~75%   Hip internal rotation Limited by ~75%   Hip external rotation    Knee flexion    Knee extension    Ankle dorsiflexion    Ankle plantarflexion    Ankle inversion    Ankle eversion     (Blank rows = not tested)  LOWER EXTREMITY MMT:    MMT Right eval Left eval  Hip flexion 20.9 65.8  Hip extension    Hip abduction 26.2 32.8  Hip adduction    Hip internal rotation    Hip external rotation    Knee flexion    Knee extension    Ankle dorsiflexion    Ankle plantarflexion    Ankle inversion    Ankle eversion     (Blank rows = not tested)  LUMBAR SPECIAL TESTS:  Slump test: Negative  FUNCTIONAL TESTS:  Timed up and go (TUG): 11.10 5 x STS 18.36   4 stage balance :passed GAIT: Distance walked: 500 ft Assistive device utilized: None Level of assistance: Complete Independence Comments: right hip in ER, antalgic limp off loading left  TREATMENT [facility] Adult PT  Treatment:                                             Date: 02/07/24 Pt seen for aquatic therapy today.  Treatment took place in  water 3.5-4.75 ft in depth at the [facility] pool. Temp of water was 91.  Pt entered/exited the pool via stairs in step-to, hop forward with bil rail.   -white noodle support posteriorly across chest : decompression; cycling; hip add/abd (extended time for pain reduction) -same position supported by noodle forward; backward then side stepping x 2 widths 4.6 ft - squatted rest period -standing hip circles cw&ccw ue support wall -decompression on noodle - L stretch 4.6 ft; hip hiking -forward amb unsupported  - with UE on yellow hand floats: walking forward/ backward multiple laps, side stepping.   -side stepping.  (Discomfort right hip with side stepping to left continues but tolerable)->ue support wall with slight side lunge  - squatted rest period reduces pain - UE on wall: relaxed squat  additional yellow noodle under hips in semi supine/recumbent position: hip extension  - just white noodle: cycling; hip add/abd; clam shell -[facility] R/L hip IR and ER -open book/closed book for thoracis spine stretch -L stretch-> hip hiking - Walking between exercises for recovery between exercises    Pt requires the buoyancy and hydrostatic pressure of water for support, and to offload joints by unweighting joint load by at least 50 % in navel deep water and by at least 75-80% in chest to neck deep water.  Viscosity of the water is needed for resistance of strengthening. Water current perturbations provides challenge to standing balance requiring increased core activation.                                                                                                                                  PATIENT EDUCATION:  Education details: intro to aquatic therapy  Person educated: Patient Education method: Programmer, Multimedia, demo  Education comprehension: verbalized understanding, return demo  HOME EXERCISE PROGRAM: TBA  ASSESSMENT:  CLINICAL IMPRESSION: Pt arrives with high pain  sensitivity, focus on pain reduction through gentle movement patterns initially supported with foam then reduced to without. Able to gain BKTC in suspended prone which provides a good LB stretch without hip discomfort.  Reduction of pain by 6 NPRS by end of session.  Goals ongoing         Initial evaluation:  Patient is a 43 y.o. m who was seen today for physical therapy evaluation and treatment for right hip and LBP. Pt with OA as per x-rays right hip likely causing radiating pain into rle and possibly in lumbar spine.  He presents with pain limited deficits in ROM, endurance, activity tolerance particularly standing, gait, balance, and functional mobility with ADL's. 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  As indicated by subjective information, objective measures and his outcome measure score he is limited in all functional mobility and ADL's. He will benefit from skilled PT intervention to improve all deficits.  Initial plan is to progress with aquatics. Will transition onto land as approp.      OBJECTIVE IMPAIRMENTS: Abnormal gait, decreased activity tolerance, decreased balance, decreased mobility, difficulty walking, decreased ROM, decreased strength, obesity, and pain.   ACTIVITY LIMITATIONS: lifting, bending, sitting, standing, squatting, sleeping, stairs, transfers, and locomotion level  PARTICIPATION LIMITATIONS: community activity, occupation, and yard work  PERSONAL FACTORS: Age, Fitness, and 1 comorbidity: obesity are also affecting patient's functional outcome.   REHAB POTENTIAL: Good  CLINICAL DECISION MAKING: Stable/uncomplicated  EVALUATION COMPLEXITY: Low   GOALS: Goals reviewed with patient? Yes  SHORT TERM GOALS: Target date: 01/06/24  Pt will tolerate full aquatic sessions consistently without increase in pain and with improving function to demonstrate good toleration and effectiveness of intervention.   Baseline: Goal status: Met 01/18/24  2.  Pt will consider gaining pool access for use of the properties of water for chronic conditions maintaining mobility and minimizing pain.  Baseline:  Goal status: In progress 01/18/24  3.  Pt will report decrease in pain submerged to demonstrate one of the advantages of exercise in aquatics using the properties of water. Baseline:  Goal status: Met 01/18/24    LONG TERM GOALS: Target date: 02/23/24  Pt to improve on LEFS by at least 9 point to demonstrate statistically significant Improvement in function. Baseline: 25/80 Goal status: INITIAL  2.  Pt will report decrease in pain by at least 50% for improved toleration to activity/quality of life and to demonstrate improved management of pain. Baseline:  Goal status: INITIAL  3.  Pt will improve R hip strength flex by at least 10 lbs to demonstrate improved overall physical function Baseline:  Goal status: INITIAL  4.  Pt will report return to consistent exercise regimen for overall wellness. Baseline:  Goal status: INITIAL  5.  Pt will improve ROM of R hip by 25% to improve function, decrease risk of injury and pain and improve quality of life Baseline:  Goal status: INITIAL  6.  Pt will be indep with final HEP's (land and aquatic as appropriate) for continued management of condition Baseline:  Goal status: INITIAL  PLAN:  PT FREQUENCY: 1-2x/week  PT DURATION: 8 weeks  PLANNED INTERVENTIONS: 97164- PT Re-evaluation, 97110-Therapeutic exercises, 97530- Therapeutic activity, 97112- Neuromuscular re-education, 97535- Self Care, 02859- Manual therapy, [id number]- Gait training, [phone]- Aquatic Therapy, [phone] (1-2 muscles), 20561 (3+ muscles)- Dry Needling, Patient/Family education, Balance training, Stair training, Taping, Joint mobilization, DME instructions, Cryotherapy, and Moist heat.  PLAN FOR NEXT SESSION: aquatic: right hip and LB ROM strengthening and pain management.  Balance and  proprioception re-training  Ronal Foots) Dariona Postma MPT 02/07/24 4:43 PM [facility] [address] [place], [place], [address] Phone: [phone]   Fax:  [phone]    For all possible CPT codes, reference the Planned Interventions line above.     Check all conditions that are expected to impact treatment: {Conditions expected to impact treatment:Morbid obesity and Musculoskeletal disorders   If treatment provided at initial evaluation, no treatment charged due to lack of authorization.

## 2024-02-08 ENCOUNTER — Ambulatory Visit: Admitting: Physician Assistant

## 2024-02-11 ENCOUNTER — Other Ambulatory Visit: Payer: Self-pay

## 2024-02-11 ENCOUNTER — Other Ambulatory Visit (HOSPITAL_COMMUNITY): Payer: Self-pay

## 2024-02-12 ENCOUNTER — Other Ambulatory Visit (HOSPITAL_COMMUNITY): Payer: Self-pay

## 2024-02-13 ENCOUNTER — Telehealth: Payer: Self-pay | Admitting: Radiology

## 2024-02-13 NOTE — Telephone Encounter (Signed)
 Left voicemail to reschedule Thurs. 12/18 appointment with Tory. He will not be in office.

## 2024-02-14 ENCOUNTER — Ambulatory Visit (HOSPITAL_BASED_OUTPATIENT_CLINIC_OR_DEPARTMENT_OTHER): Admitting: Physical Therapy

## 2024-02-14 ENCOUNTER — Encounter (HOSPITAL_BASED_OUTPATIENT_CLINIC_OR_DEPARTMENT_OTHER): Payer: Self-pay | Admitting: Physical Therapy

## 2024-02-14 DIAGNOSIS — R262 Difficulty in walking, not elsewhere classified: Secondary | ICD-10-CM

## 2024-02-14 DIAGNOSIS — G8929 Other chronic pain: Secondary | ICD-10-CM

## 2024-02-14 DIAGNOSIS — M5459 Other low back pain: Secondary | ICD-10-CM

## 2024-02-14 DIAGNOSIS — M25551 Pain in right hip: Secondary | ICD-10-CM | POA: Diagnosis not present

## 2024-02-14 DIAGNOSIS — M6281 Muscle weakness (generalized): Secondary | ICD-10-CM

## 2024-02-14 NOTE — Therapy (Signed)
 OUTPATIENT PHYSICAL THERAPY LE TREATMENT   Patient Name: Austin Beasley. MRN: [id number] DOB:01-12-1981, 43 y.o., male Today's Date: 02/14/2024  END OF SESSION:  PT End of Session - 02/14/24 1619     Visit Number 7    Date for Recertification  02/23/24    Authorization Type [place] mcaid prepay    PT Start Time 1618    PT Stop Time 1658    PT Time Calculation (min) 40 min    Activity Tolerance Patient tolerated treatment well    Behavior During Therapy WFL for tasks assessed/performed            Past Medical History:  Diagnosis Date   Arthritis    right hip   Diabetes mellitus without complication (HCC)    High cholesterol    Hypertension    Obesity    Palpitations    Thyroid  disease    Past Surgical History:  Procedure Laterality Date   ELEVATION OF DEPRESSED SKULL FRACTURE     age 22   LAPAROSCOPIC GASTRIC SLEEVE RESECTION  11/16/2022   TONSILLECTOMY AND ADENOIDECTOMY     as early teenager   Patient Active Problem List   Diagnosis Date Noted   Osteoarthritis of right hip 12/03/2023   Chronic pain syndrome 12/03/2023   Chronic right hip pain 09/27/2023   Chronic bilateral low back pain with right-sided sciatica 09/27/2023   Achilles tendinitis of right lower extremity 06/01/2021   Pain due to onychomycosis of toenails of both feet 02/01/2021   Class 3 severe obesity with serious comorbidity and body mass index (BMI) of 50.0 to 59.9 in adult [facility]) 05/16/2019   Type 2 diabetes mellitus with diabetic neuropathy, without long-term current use of insulin  (HCC) 08/07/2018   Diabetes mellitus without complication (HCC) 05/23/2017    PCP: Haze Servant NP  REFERRING PROVIDER: Emeline Joesph BROCKS, DO   REFERRING DIAG:  [phone] (ICD-10-CM) - Chronic right hip pain  G89.29,M54.41 (ICD-10-CM) - Chronic bilateral low back pain with right-sided sciatica    Rationale for Evaluation and Treatment: Rehabilitation  THERAPY DIAG:  Chronic right hip  pain  Muscle weakness (generalized)  Other low back pain  Difficulty in walking, not elsewhere classified  ONSET DATE: 1 year  SUBJECTIVE:                                                                                                                                                                                           SUBJECTIVE STATEMENT: Pt reports low pain today 2/10 but hasn't worked in 3 days and does not return for total of 8 days.  POOL ACCESS: limited access to  [facility] (works at [employer])    From initial evaluation:  I have lost 150 lbs in past 1.5 years.  Pain came on after that.  I was walking 3-5 miles a day to assist with weight loss but had to stop. Had a cortizone shot in my hip and helped some. I expected more. 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 Chronic R hip and low back arhtritis. Aquatherapy referral for offloading joints, strengthening and endurance training to improve ambulatory tolerance.  Stand up to 10 hours day at work pain increases. Take Tylenol .     PAIN:  Are you having pain? no: NPRS scale: current 0/10; worst 9/10; Pain location: .. R hip pain radiating down into the right foot, +numbness/tingling when he is sitting down Pain description: .intermittent, constant, sharp, and aching, stiff from R hip wrapping to his groin--constant but does wax and wane Aggravating factors: SABRASABRAThrough the day; walking, standing after prolonged, first thing in the morning sitting Relieving factors: medication  PRECAUTIONS: None  RED FLAGS: None   WEIGHT BEARING RESTRICTIONS: No  FALLS:  Has patient fallen in last 6 months? 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  DIAGNOSTIC FINDINGS:  R hip x-ray 1/25 Age advanced arthropathy of the right hip with acetabular over coverage and femoral head neck osteophytes. Similar findings are seen to a lesser extent involving the left hip.  PATIENT SURVEYS:  LEFS: 25/80  COGNITION: Overall cognitive status: Within functional limits for tasks assessed     SENSATION: WFL  MUSCLE LENGTH: Hamstrings: tight bilaterally tested in sitting   POSTURE: Pelvis was  even.  Lumbar lordotic curvature was increased .  There was mild dextroscoliosis. right hip in ER  PALPATION: TTP lumbar paraspinals and R PSIS   LOWER EXTREMITY ROM:     Active  Right eval Left eval  Hip flexion 65 wfl  Hip extension Limited by ~50%   Hip abduction Limited by ~75%   Hip adduction Limited by ~75%   Hip internal rotation Limited by ~75%   Hip external rotation    Knee flexion    Knee extension    Ankle dorsiflexion    Ankle plantarflexion    Ankle inversion    Ankle eversion     (Blank rows = not tested)  LOWER EXTREMITY MMT:    MMT Right eval Left eval  Hip flexion 20.9 65.8  Hip extension    Hip abduction 26.2 32.8  Hip adduction    Hip internal rotation    Hip external rotation    Knee flexion    Knee extension    Ankle dorsiflexion    Ankle plantarflexion    Ankle inversion    Ankle eversion     (Blank rows = not tested)  LUMBAR SPECIAL TESTS:  Slump test: Negative  FUNCTIONAL TESTS:  Timed up and go (TUG): 11.10 5 x STS 18.36   4 stage balance :passed GAIT: Distance walked: 500 ft Assistive device utilized: None Level of assistance: Complete Independence Comments: right hip in ER, antalgic limp off loading left  TREATMENT [facility] Adult PT Treatment:  Date: 02/07/24 Pt seen for aquatic therapy today.  Treatment took place in water 3.5-4.75 ft in  depth at the [facility] pool. Temp of water was 91.  Pt entered/exited the pool via stairs in step-to, hop forward with bil rail.  -walking forward/backward and side -forward and backward march with kick -standing hip circles cw&ccw ue support wall x15 ea R/L -[facility] R/L hip IR and ER x10 R/L - L stretch 4.6 ft; hip hiking - prone suspension using white noodle under abdomin ue support wall: holding position for LB and hip stretch x 20s; hip flex alternating x 5; hip add/abd - supine suspension as above: hip extension -white noodle support posteriorly across chest : decompression; cycling; hip add/abd (extended time for pain reduction) - Walking between exercises for recovery between exercises  Pt requires the buoyancy and hydrostatic pressure of water for support, and to offload joints by unweighting joint load by at least 50 % in navel deep water and by at least 75-80% in chest to neck deep water.  Viscosity of the water is needed for resistance of strengthening. Water current perturbations provides challenge to standing balance requiring increased core activation.                                                                                                                                  PATIENT EDUCATION:  Education details: intro to aquatic therapy  Person educated: Patient Education method: Programmer, Multimedia, demo  Education comprehension: verbalized understanding, return demo  HOME EXERCISE PROGRAM: TBA  ASSESSMENT:  CLINICAL IMPRESSION: Greatly reduced pain sensitivity today due to not having worked in 3 days.  He tolerates increased reps and intensity with strengthening and stretching. Able to move through most movement patterns without discomfort allowing for increased muscle engagement and lengthening throughout core and le. Visually he demonstrates improved range in hip ext and abd.  Good session.  Goal ongoing     Initial evaluation:  Patient is a 43 y.o. m who  was seen today for physical therapy evaluation and treatment for right hip and LBP. Pt with OA as per x-rays right hip likely causing radiating pain into rle and possibly in lumbar spine.  He presents with pain limited deficits in ROM, endurance, activity tolerance particularly standing, gait, balance, and functional mobility with ADL's. 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  As indicated by subjective information, objective measures and his outcome measure score he is limited in all functional mobility and ADL's. He will benefit from skilled PT intervention to improve all deficits.  Initial plan is to progress with aquatics. Will transition onto land as approp.      OBJECTIVE IMPAIRMENTS: Abnormal gait, decreased activity tolerance, decreased balance, decreased mobility, difficulty walking, decreased ROM, decreased strength, obesity, and pain.   ACTIVITY LIMITATIONS: lifting, bending, sitting, standing, squatting, sleeping, stairs, transfers, and locomotion level  PARTICIPATION LIMITATIONS:  community activity, occupation, and yard work  PERSONAL FACTORS: Age, Fitness, and 1 comorbidity: obesity are also affecting patient's functional outcome.   REHAB POTENTIAL: Good  CLINICAL DECISION MAKING: Stable/uncomplicated  EVALUATION COMPLEXITY: Low   GOALS: Goals reviewed with patient? Yes  SHORT TERM GOALS: Target date: 01/06/24  Pt will tolerate full aquatic sessions consistently without increase in pain and with improving function to demonstrate good toleration and effectiveness of intervention.  Baseline: Goal status: Met 01/18/24  2.  Pt will consider gaining pool access for use of the properties of water for chronic conditions maintaining mobility and minimizing pain.  Baseline:  Goal status: In progress 01/18/24  3.  Pt will report decrease in pain submerged to demonstrate one of the advantages of exercise in aquatics using the  properties of water. Baseline:  Goal status: Met 01/18/24    LONG TERM GOALS: Target date: 02/23/24  Pt to improve on LEFS by at least 9 point to demonstrate statistically significant Improvement in function. Baseline: 25/80 Goal status: INITIAL  2.  Pt will report decrease in pain by at least 50% for improved toleration to activity/quality of life and to demonstrate improved management of pain. Baseline:  Goal status: INITIAL  3.  Pt will improve R hip strength flex by at least 10 lbs to demonstrate improved overall physical function Baseline:  Goal status: INITIAL  4.  Pt will report return to consistent exercise regimen for overall wellness. Baseline:  Goal status: INITIAL  5.  Pt will improve ROM of R hip by 25% to improve function, decrease risk of injury and pain and improve quality of life Baseline:  Goal status: INITIAL  6.  Pt will be indep with final HEP's (land and aquatic as appropriate) for continued management of condition Baseline:  Goal status: INITIAL  PLAN:  PT FREQUENCY: 1-2x/week  PT DURATION: 8 weeks  PLANNED INTERVENTIONS: 97164- PT Re-evaluation, 97110-Therapeutic exercises, 97530- Therapeutic activity, 97112- Neuromuscular re-education, 97535- Self Care, 02859- Manual therapy, [id number]- Gait training, [phone]- Aquatic Therapy, [phone] (1-2 muscles), 20561 (3+ muscles)- Dry Needling, Patient/Family education, Balance training, Stair training, Taping, Joint mobilization, DME instructions, Cryotherapy, and Moist heat.  PLAN FOR NEXT SESSION: aquatic: right hip and LB ROM strengthening and pain management.  Balance and proprioception re-training  Ronal Foots) Priscila Bean MPT 02/14/24 4:37 PM [facility] [address] [place], [place], [address] Phone: [phone]   Fax:  [phone]    For all possible CPT codes, reference the Planned Interventions line above.     Check all conditions that are expected  to impact treatment: {Conditions expected to impact treatment:Morbid obesity and Musculoskeletal disorders   If treatment provided at initial evaluation, no treatment charged due to lack of authorization.

## 2024-02-20 ENCOUNTER — Encounter: Payer: Self-pay | Admitting: Radiology

## 2024-02-21 ENCOUNTER — Ambulatory Visit (HOSPITAL_BASED_OUTPATIENT_CLINIC_OR_DEPARTMENT_OTHER): Admitting: Physical Therapy

## 2024-02-22 ENCOUNTER — Ambulatory Visit: Admitting: Physician Assistant

## 2024-02-22 ENCOUNTER — Encounter: Admitting: Physical Medicine & Rehabilitation

## 2024-02-27 ENCOUNTER — Encounter: Payer: Self-pay | Admitting: Nurse Practitioner

## 2024-02-27 ENCOUNTER — Ambulatory Visit (HOSPITAL_BASED_OUTPATIENT_CLINIC_OR_DEPARTMENT_OTHER): Admitting: Physical Therapy

## 2024-02-28 DIAGNOSIS — Z23 Encounter for immunization: Secondary | ICD-10-CM | POA: Diagnosis not present

## 2024-02-28 NOTE — Progress Notes (Signed)
Flu documented

## 2024-03-02 ENCOUNTER — Other Ambulatory Visit (HOSPITAL_COMMUNITY): Payer: Self-pay

## 2024-03-05 ENCOUNTER — Other Ambulatory Visit (INDEPENDENT_AMBULATORY_CARE_PROVIDER_SITE_OTHER)

## 2024-03-05 ENCOUNTER — Ambulatory Visit (HOSPITAL_BASED_OUTPATIENT_CLINIC_OR_DEPARTMENT_OTHER): Admitting: Physical Therapy

## 2024-03-05 ENCOUNTER — Encounter (HOSPITAL_BASED_OUTPATIENT_CLINIC_OR_DEPARTMENT_OTHER): Payer: Self-pay | Admitting: Physical Therapy

## 2024-03-05 ENCOUNTER — Ambulatory Visit: Admitting: Physician Assistant

## 2024-03-05 VITALS — Ht 72.0 in | Wt 311.0 lb

## 2024-03-05 DIAGNOSIS — G8929 Other chronic pain: Secondary | ICD-10-CM

## 2024-03-05 DIAGNOSIS — R262 Difficulty in walking, not elsewhere classified: Secondary | ICD-10-CM

## 2024-03-05 DIAGNOSIS — M1611 Unilateral primary osteoarthritis, right hip: Secondary | ICD-10-CM

## 2024-03-05 DIAGNOSIS — M5459 Other low back pain: Secondary | ICD-10-CM

## 2024-03-05 DIAGNOSIS — M25551 Pain in right hip: Secondary | ICD-10-CM | POA: Diagnosis not present

## 2024-03-05 NOTE — Therapy (Signed)
 " OUTPATIENT PHYSICAL THERAPY LE TREATMENT Progress Note / Re-Cert Reporting Period 12/12/23 to 03/05/24  See note below for Objective Data and Assessment of Progress/Goals.      Patient Name: Austin Beasley. MRN: [id number] DOB:1980-07-24, 43 y.o., male Today's Date: 03/05/2024  END OF SESSION:  PT End of Session - 03/05/24 1631     Visit Number 8    Date for Recertification  04/05/24    Authorization Type  mcaid prepay    PT Start Time 1620    PT Stop Time 1700    PT Time Calculation (min) 40 min    Activity Tolerance Patient tolerated treatment well    Behavior During Therapy WFL for tasks assessed/performed            Past Medical History:  Diagnosis Date   Arthritis    right hip   Diabetes mellitus without complication (HCC)    High cholesterol    Hypertension    Obesity    Palpitations    Thyroid  disease    Past Surgical History:  Procedure Laterality Date   ELEVATION OF DEPRESSED SKULL FRACTURE     age 83   LAPAROSCOPIC GASTRIC SLEEVE RESECTION  11/16/2022   TONSILLECTOMY AND ADENOIDECTOMY     as early teenager   Patient Active Problem List   Diagnosis Date Noted   Osteoarthritis of right hip 12/03/2023   Chronic pain syndrome 12/03/2023   Chronic right hip pain 09/27/2023   Chronic bilateral low back pain with right-sided sciatica 09/27/2023   Achilles tendinitis of right lower extremity 06/01/2021   Pain due to onychomycosis of toenails of both feet 02/01/2021   Class 3 severe obesity with serious comorbidity and body mass index (BMI) of 50.0 to 59.9 in adult [facility]) 05/16/2019   Type 2 diabetes mellitus with diabetic neuropathy, without long-term current use of insulin  (HCC) 08/07/2018   Diabetes mellitus without complication (HCC) 05/23/2017    PCP: Haze Servant NP  REFERRING PROVIDER: Emeline Joesph BROCKS, DO   REFERRING DIAG:  [phone] (ICD-10-CM) - Chronic right hip pain  G89.29,M54.41 (ICD-10-CM) - Chronic bilateral low back  pain with right-sided sciatica    Rationale for Evaluation and Treatment: Rehabilitation  THERAPY DIAG:  Chronic right hip pain  Difficulty in walking, not elsewhere classified  Other low back pain  ONSET DATE: 1 year  SUBJECTIVE:                                                                                                                                                                                           SUBJECTIVE STATEMENT: Saw surgeon today they wanted me to  schedule surgery (THR) today but I wanted to meet the surgeon first pain 1/10 haven't worked in 2 weeks  POOL ACCESS: limited access to [employer] (works at [employer])    From initial evaluation:  I have lost 150 lbs in past 1.5 years.  Pain came on after that.  I was walking 3-5 miles a day to assist with weight loss but had to stop. Had a cortizone shot in my hip and helped some. I expected more. 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 Chronic R hip and low back arhtritis. Aquatherapy referral for offloading joints, strengthening and endurance training to improve ambulatory tolerance.  Stand up to 10 hours day at work pain increases. Take Tylenol .     PAIN:  Are you having pain? no: NPRS scale: current 0/10; worst 9/10; Pain location: .. R hip pain radiating down into the right foot, +numbness/tingling when he is sitting down Pain description: .intermittent, constant, sharp, and aching, stiff from R hip wrapping to his groin--constant but does wax and wane Aggravating factors: SABRASABRAThrough the day; walking, standing after prolonged, first thing in the morning sitting Relieving factors: medication  PRECAUTIONS: None  RED FLAGS: None   WEIGHT BEARING RESTRICTIONS: No  FALLS:  Has patient fallen in last 6 months? 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  DIAGNOSTIC FINDINGS:  R hip x-ray 1/25 Age advanced arthropathy of the right hip with acetabular over coverage and femoral head neck osteophytes. Similar findings are seen to a lesser extent involving the left hip.  PATIENT SURVEYS:  LEFS: 25/80 03/05/24: 40/80  COGNITION: Overall cognitive status: Within functional limits for tasks assessed     SENSATION: WFL  MUSCLE LENGTH: Hamstrings: tight bilaterally tested in sitting   POSTURE: Pelvis was  even.  Lumbar lordotic curvature was increased .  There was mild dextroscoliosis. right hip in ER  PALPATION: TTP lumbar paraspinals and R PSIS   LOWER EXTREMITY ROM:     Active  Right eval Left eval  Hip flexion 65 wfl  Hip extension Limited by ~50%   Hip abduction Limited by ~75%   Hip adduction Limited by ~75%   Hip internal rotation Limited by ~75%   Hip external rotation    Knee flexion    Knee extension    Ankle dorsiflexion    Ankle plantarflexion    Ankle inversion    Ankle eversion     (Blank rows = not tested)  03/05/24: no changes in right hip ROM  LOWER EXTREMITY MMT:    MMT Right eval Left eval R / L 03/05/24  Hip flexion 20.9 65.8 27.3  Hip extension     Hip abduction 26.2 32.8 30.5  Hip adduction     Hip internal rotation     Hip external rotation     Knee flexion     Knee extension     Ankle dorsiflexion     Ankle plantarflexion     Ankle inversion     Ankle eversion      (Blank rows = not tested)  LUMBAR SPECIAL TESTS:  Slump test: Negative  FUNCTIONAL  TESTS:  Timed up and go (TUG): 11.10 5 x STS 18.36   4 stage balance :passed GAIT: Distance walked: 500 ft Assistive device utilized: None Level of assistance: Complete  Independence Comments: right hip in ER, antalgic limp off loading left  TREATMENT [facility] Adult PT Treatment:                                             Date: 03/05/24 Pt seen for aquatic therapy today.  Treatment took place in water 3.5-4.75 ft in depth at the [facility] pool. Temp of water was 91.  Pt entered/exited the pool via stairs in step-to, hop forward with bil rail.  -walking forward/backward and side -side stepping with ue add/abd blue HB -forward and backward march with kick -standing hip circles cw&ccw ue support wall x15 ea R/L -[facility] R/L hip IR and ER x10 R/L - L stretch 4.6 ft; hip hiking  PN testing  Pt requires the buoyancy and hydrostatic pressure of water for support, and to offload joints by unweighting joint load by at least 50 % in navel deep water and by at least 75-80% in chest to neck deep water.  Viscosity of the water is needed for resistance of strengthening. Water current perturbations provides challenge to standing balance requiring increased core activation.                                                                                                                                  PATIENT EDUCATION:  Education details: intro to aquatic therapy  Person educated: Patient Education method: Programmer, Multimedia, demo  Education comprehension: verbalized understanding, return demo  HOME EXERCISE PROGRAM: TBA  ASSESSMENT:  CLINICAL IMPRESSION: PN: pt demonstrates reduced pain sensitivity today but is directly related to his work schedule.  His pain will fluctuate from 1/10 (not having worked a few days)to 9-10/10 (post work).  He has had no real improvement in right hip ROM likely due to the severity of OA. His strength although has improved as indicated in chart above.  He has met 2/3 STG's and 2/6 LTG's.  He has seen surgeon and is not planning on a surgical replacement as soon as it can get scheduled.  He will continue to benefit from skilled PT  aquatic intervention to further strength and encourage R hip ROM in preparation for surgery. Will likely forgo land based intervention due to pain limitations until post surgical if MD deems appropriate        Initial evaluation:  Patient is a 43 y.o. m who was seen today for physical therapy evaluation and treatment for right hip and LBP. Pt with OA as per x-rays right hip likely causing radiating pain into rle and possibly in lumbar spine.  He presents with pain limited deficits in ROM, endurance, activity tolerance particularly standing,  gait, balance, and functional mobility with ADL's. 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  As indicated by subjective information, objective measures and his outcome measure score he is limited in all functional mobility and ADL's. He will benefit from skilled PT intervention to improve all deficits.  Initial plan is to progress with aquatics. Will transition onto land as approp.      OBJECTIVE IMPAIRMENTS: Abnormal gait, decreased activity tolerance, decreased balance, decreased mobility, difficulty walking, decreased ROM, decreased strength, obesity, and pain.   ACTIVITY LIMITATIONS: lifting, bending, sitting, standing, squatting, sleeping, stairs, transfers, and locomotion level  PARTICIPATION LIMITATIONS: community activity, occupation, and yard work  PERSONAL FACTORS: Age, Fitness, and 1 comorbidity: obesity are also affecting patient's functional outcome.   REHAB POTENTIAL: Good  CLINICAL DECISION MAKING: Stable/uncomplicated  EVALUATION COMPLEXITY: Low   GOALS: Goals reviewed with patient? Yes  SHORT TERM GOALS: Target date: 01/06/24  Pt will tolerate full aquatic sessions consistently without increase in pain and with improving function to demonstrate good toleration and effectiveness of intervention.  Baseline: Goal status: Met 01/18/24  2.  Pt will consider gaining pool access for  use of the properties of water for chronic conditions maintaining mobility and minimizing pain.  Baseline:  Goal status: In progress 01/18/24; 03/05/24  3.  Pt will report decrease in pain submerged to demonstrate one of the advantages of exercise in aquatics using the properties of water. Baseline:  Goal status: Met 01/18/24    LONG TERM GOALS: Target date: 04/06/23  Pt to improve on LEFS by at least 9 point to demonstrate statistically significant Improvement in function. Baseline: 25/80; 40/80 Goal status: Met 03/05/24  2.  Pt will report decrease in pain by at least 50% for improved toleration to activity/quality of life and to demonstrate improved management of pain. Baseline:  Goal status: Met 03/05/24  3.  Pt will improve R hip strength flex by at least 10 lbs to demonstrate improved overall physical function Baseline:  Goal status: In progress 03/05/24  4.  Pt will report return to consistent exercise regimen for overall wellness. Baseline:  Goal status: In progress 03/05/24  5.  Pt will improve ROM of R hip by 25% to improve function, decrease risk of injury and pain and improve quality of life Baseline:  Goal status: In progress 03/05/24  6.  Pt will be indep with final HEP's (land and aquatic as appropriate) for continued management of condition Baseline:  Goal status: In progress 03/05/24  PLAN:  PT FREQUENCY: 1-2x/week  PT DURATION: 4 weeks  PLANNED INTERVENTIONS: 97164- PT Re-evaluation, 97110-Therapeutic exercises, 97530- Therapeutic activity, 97112- Neuromuscular re-education, 97535- Self Care, 02859- Manual therapy, [phone]- Gait training, [phone]- Aquatic Therapy, [phone] (1-2 muscles), 20561 (3+ muscles)- Dry Needling, Patient/Family education, Balance training, Stair training, Taping, Joint mobilization, DME instructions, Cryotherapy, and Moist heat.  PLAN FOR NEXT SESSION: aquatic: right hip and LB ROM strengthening and pain management.  Balance and  proprioception re-training  Ronal Foots) Rodman Recupero MPT 03/05/2024 5:19 PM [facility] [address] [place], [place], [address] Phone: [phone]   Fax:  [phone]    For all possible CPT codes, reference the Planned Interventions line above.     Check all conditions that are expected to impact treatment: {Conditions expected to impact treatment:Morbid obesity and Musculoskeletal disorders   If treatment provided at initial evaluation, no treatment charged due to lack of authorization.      "

## 2024-03-05 NOTE — Progress Notes (Signed)
 "  Office Visit Note   Patient: Austin Beasley.           Date of Birth: March 03, 1981           MRN: [id number] Visit Date: 03/05/2024              Requested by: Emeline Joesph BROCKS, DO [address] [place],  [place] [address] PCP: Theotis Haze ORN, NP   Assessment & Plan: Visit Diagnoses:  1. Primary osteoarthritis of right hip     Plan: Given patient's failure of conservative treatment which is included time oral medications and cortisone injections recommend right total hip arthroplasty.  Given his diabetes although well-controlled and his weight he is slightly increased risk of surgical complications.  #1 increased risk would be wound healing problems versus infection.  Discussed this with him at length.  He also will need to be off his Ozempic  prior to surgery.  Discussed with him the need to cut back on smoking if not completely stop smoking.  Continue to work on weight loss.  Risk benefits of surgery discussed at length risk include but are not limited to DVT/PE, wound healing problems, infection, blood loss, nerve vessel injury and leg length discrepancy.  Handout on total hip arthroplasty was given.  Questions were encouraged and answered at length.  He would like to meet Dr. Vernetta prior to scheduling surgery and therefore we will have him come back to see Dr. Vernetta.  He would only like to have surgery at [facility].  Follow-Up Instructions: Return in about 2 weeks (around 03/19/2024), or Dr. Vernetta.   Orders:  Orders Placed This Encounter  Procedures   XR HIP UNILAT W OR W/O PELVIS 1V RIGHT   No orders of the defined types were placed in this encounter.     Procedures: No procedures performed   Clinical Data: No additional findings.   Subjective: Chief Complaint  Patient presents with   Right Hip - Pain    HPI Austin Beasley is 44 year old male comes in today with right hip pain this been ongoing for over a year.  States the pain is becoming  worse.  He notes that he is unable to exercise to maintain his weight due to the right hip pain.  History of gastric bypass with some 100 pounds weight loss.  Is diabetic hemoglobin A1c 1 month ago was 5.3.  Notes the pain in his right hip is severe despite the fact that he has tried Tylenol  arthritis for pain and hip injections. Radiographs of his right hip pelvis shows severe right hip arthritic changes with periarticular osteophytes.  Subchondral cystic changes within the femoral head.  Moderate arthritic changes involving the left hip.  Denies any significant alcohol use.  Does smoke less than a half a pack of cigarettes a day.  He is currently on the Ozempic .  He currently denies any pain in the left hip.  Has difficulty donning shoes and socks due to the right hip pain.  Review of Systems Denies any fevers chills.  Objective: Vital Signs: Ht 6' (1.829 m)   Wt (!) 311 lb (141.1 kg)   BMI 42.18 kg/m   Physical Exam Constitutional:      Appearance: He is not ill-appearing or diaphoretic.  Pulmonary:     Effort: Pulmonary effort is normal.  Neurological:     Mental Status: He is alert and oriented to person, place, and time.  Psychiatric:  Mood and Affect: Mood normal.     Ortho Exam Bilateral hips: Good range of motion left hip without pain.  Right hip he has diminished internal rotation virtually none and pain with external rotation.  Nontender over the trochanteric region both hips.  Dorsiflexion plantarflexion bilateral ankles intact.  Calves are supple nontender.  Was able to lay him down today on the exam table and the area of surgical incision was not compromised due to his abdomen.  Specialty Comments:  No specialty comments available.  Imaging: XR HIP UNILAT W OR W/O PELVIS 1V RIGHT Result Date: 03/05/2024 AP pelvis and lateral view of the right hip: No acute fractures.  No femoral head collapse.  Cystic changes within the right femoral head with severe end-stage  arthritis of the right hip.  Slight elongation and loss of the spherical shape of the right femoral head compared to prior films.  Periarticular osteophytes noted.  Left hip moderate to moderately severe arthritic changes.    PMFS History: Patient Active Problem List   Diagnosis Date Noted   Osteoarthritis of right hip 12/03/2023   Chronic pain syndrome 12/03/2023   Chronic right hip pain 09/27/2023   Chronic bilateral low back pain with right-sided sciatica 09/27/2023   Achilles tendinitis of right lower extremity 06/01/2021   Pain due to onychomycosis of toenails of both feet 02/01/2021   Class 3 severe obesity with serious comorbidity and body mass index (BMI) of 50.0 to 59.9 in adult [facility]) 05/16/2019   Type 2 diabetes mellitus with diabetic neuropathy, without long-term current use of insulin  (HCC) 08/07/2018   Diabetes mellitus without complication (HCC) 05/23/2017   Past Medical History:  Diagnosis Date   Arthritis    right hip   Diabetes mellitus without complication (HCC)    High cholesterol    Hypertension    Obesity    Palpitations    Thyroid  disease     Family History  Problem Relation Age of Onset   Breast cancer Mother    Hypertension Father    Breast cancer Sister    Birth defects Maternal Grandmother    Breast cancer Maternal Grandmother    Birth defects Maternal Grandfather    Birth defects Paternal Grandmother    Birth defects Paternal Grandfather    Colon cancer Paternal Grandfather    Esophageal cancer Neg Hx    Rectal cancer Neg Hx    Stomach cancer Neg Hx     Past Surgical History:  Procedure Laterality Date   ELEVATION OF DEPRESSED SKULL FRACTURE     age 18   LAPAROSCOPIC GASTRIC SLEEVE RESECTION  11/16/2022   TONSILLECTOMY AND ADENOIDECTOMY     as early teenager   Social History   Occupational History   Occupation: self employed  Tobacco Use   Smoking status: Some Days    Current packs/day: 0.25    Average packs/day: 0.3 packs/day for  27.0 years (6.8 ttl pk-yrs)    Types: Cigarettes   Smokeless tobacco: Never  Vaping Use   Vaping status: Former   Substances: CBD  Substance and Sexual Activity   Alcohol use: Not Currently    Comment: occ   Drug use: Not Currently    Types: Marijuana   Sexual activity: Yes       20          "

## 2024-03-13 ENCOUNTER — Encounter (HOSPITAL_BASED_OUTPATIENT_CLINIC_OR_DEPARTMENT_OTHER): Payer: Self-pay | Admitting: Physical Therapy

## 2024-03-13 ENCOUNTER — Ambulatory Visit (HOSPITAL_BASED_OUTPATIENT_CLINIC_OR_DEPARTMENT_OTHER): Attending: Physical Medicine and Rehabilitation | Admitting: Physical Therapy

## 2024-03-13 DIAGNOSIS — M5459 Other low back pain: Secondary | ICD-10-CM | POA: Diagnosis present

## 2024-03-13 DIAGNOSIS — G8929 Other chronic pain: Secondary | ICD-10-CM | POA: Insufficient documentation

## 2024-03-13 DIAGNOSIS — M25551 Pain in right hip: Secondary | ICD-10-CM | POA: Insufficient documentation

## 2024-03-13 DIAGNOSIS — R262 Difficulty in walking, not elsewhere classified: Secondary | ICD-10-CM | POA: Diagnosis present

## 2024-03-13 DIAGNOSIS — M6281 Muscle weakness (generalized): Secondary | ICD-10-CM | POA: Insufficient documentation

## 2024-03-13 NOTE — Therapy (Signed)
 " OUTPATIENT PHYSICAL THERAPY LE TREATMENT     Patient Name: Austin Beasley. MRN: [id number] DOB:January 21, 1981, 44 y.o., male Today's Date: 03/13/2024  END OF SESSION:  PT End of Session - 03/13/24 1144     Visit Number 8    Date for Recertification  04/05/24    Authorization Type [place] mcaid prepay    PT Start Time 1147    PT Stop Time 1225    PT Time Calculation (min) 38 min    Activity Tolerance Patient tolerated treatment well    Behavior During Therapy WFL for tasks assessed/performed            Past Medical History:  Diagnosis Date   Arthritis    right hip   Diabetes mellitus without complication (HCC)    High cholesterol    Hypertension    Obesity    Palpitations    Thyroid  disease    Past Surgical History:  Procedure Laterality Date   ELEVATION OF DEPRESSED SKULL FRACTURE     age 34   LAPAROSCOPIC GASTRIC SLEEVE RESECTION  11/16/2022   TONSILLECTOMY AND ADENOIDECTOMY     as early teenager   Patient Active Problem List   Diagnosis Date Noted   Osteoarthritis of right hip 12/03/2023   Chronic pain syndrome 12/03/2023   Chronic right hip pain 09/27/2023   Chronic bilateral low back pain with right-sided sciatica 09/27/2023   Achilles tendinitis of right lower extremity 06/01/2021   Pain due to onychomycosis of toenails of both feet 02/01/2021   Class 3 severe obesity with serious comorbidity and body mass index (BMI) of 50.0 to 59.9 in adult (HCC) 05/16/2019   Type 2 diabetes mellitus with diabetic neuropathy, without long-term current use of insulin  (HCC) 08/07/2018   Diabetes mellitus without complication (HCC) 05/23/2017    PCP: Haze Servant NP  REFERRING PROVIDER: Emeline Joesph BROCKS, DO   REFERRING DIAG:  [phone] (ICD-10-CM) - Chronic right hip pain  G89.29,M54.41 (ICD-10-CM) - Chronic bilateral low back pain with right-sided sciatica    Rationale for Evaluation and Treatment: Rehabilitation  THERAPY DIAG:  Chronic right hip  pain  Muscle weakness (generalized)  Difficulty in walking, not elsewhere classified  Other low back pain  ONSET DATE: 1 year  SUBJECTIVE:                                                                                                                                                                                           SUBJECTIVE STATEMENT: Pain 1/10.  Haven't worked in a week. Work Sat and Sunday this week 8 hours each day.  Can't wait to have surgery.  See Md in 2 weeks to schedule  POOL ACCESS: limited access to [facility] (works at [employer])    From initial evaluation:  I have lost 150 lbs in past 1.5 years.  Pain came on after that.  I was walking 3-5 miles a day to assist with weight loss but had to stop. Had a cortizone shot in my hip and helped some. I expected more. 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 Chronic R hip and low back arhtritis. Aquatherapy referral for offloading joints, strengthening and endurance training to improve ambulatory tolerance.  Stand up to 10 hours day at work pain increases. Take Tylenol .     PAIN:  Are you having pain? no: NPRS scale: current 0/10; worst 9/10; Pain location: .. R hip pain radiating down into the right foot, +numbness/tingling when he is sitting down Pain description: .intermittent, constant, sharp, and aching, stiff from R hip wrapping to his groin--constant but does wax and wane Aggravating factors: SABRASABRAThrough the day; walking, standing after prolonged, first thing in the morning sitting Relieving factors: medication  PRECAUTIONS: None  RED FLAGS: None   WEIGHT BEARING RESTRICTIONS: No  FALLS:  Has patient fallen in last 6 months? 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  DIAGNOSTIC FINDINGS:  R hip x-ray 1/25 Age advanced arthropathy of the right hip with acetabular over coverage and femoral head neck osteophytes. Similar findings are seen to a lesser extent involving the left hip.  PATIENT SURVEYS:  LEFS: 25/80 03/05/24: 40/80  COGNITION: Overall cognitive status: Within functional limits for tasks assessed     SENSATION: WFL  MUSCLE LENGTH: Hamstrings: tight bilaterally tested in sitting   POSTURE: Pelvis was  even.  Lumbar lordotic curvature was increased .  There was mild dextroscoliosis. right hip in ER  PALPATION: TTP lumbar paraspinals and R PSIS   LOWER EXTREMITY ROM:     Active  Right eval Left eval  Hip flexion 65 wfl  Hip extension Limited by ~50%   Hip abduction Limited by ~75%   Hip adduction Limited by ~75%   Hip internal rotation Limited by ~75%   Hip external rotation    Knee flexion    Knee extension    Ankle dorsiflexion    Ankle plantarflexion    Ankle inversion    Ankle eversion     (Blank rows = not tested)  03/05/24: no changes in right hip ROM  LOWER EXTREMITY MMT:    MMT Right eval Left eval R / L 03/05/24  Hip flexion 20.9 65.8 27.3  Hip extension     Hip abduction 26.2 32.8 30.5  Hip adduction     Hip internal rotation     Hip external rotation     Knee flexion     Knee extension     Ankle dorsiflexion     Ankle plantarflexion     Ankle inversion     Ankle eversion      (Blank rows = not tested)  LUMBAR SPECIAL TESTS:  Slump test: Negative  FUNCTIONAL TESTS:  Timed up and go (TUG): 11.10 5 x STS 18.36  4 stage balance :passed GAIT: Distance walked: 500 ft Assistive device utilized: None Level of assistance: Complete Independence Comments: right hip in ER, antalgic limp off loading left  TREATMENT [facility] Adult PT Treatment:                                              Date: 03/13/24 Pt seen for aquatic therapy today.  Treatment took place in water 3.5-4.75 ft in depth at the [facility] pool. Temp of water was 91.  Pt entered/exited the pool via stairs in step-to, hop forward with bil rail.  Gait training with cane  -walking forward/backward and side -side stepping with ue add/abd blue HB->lunge - resisted hip ext and abd using rider band taught to toleration x 10 R/L  -squatted rest period - forward and backward march unsupported - standing SL clam - CKC R hip int/ext rotation -cycling on white noodle wrapped posteriorly across chest  Pt requires the buoyancy and hydrostatic pressure of water for support, and to offload joints by unweighting joint load by at least 50 % in navel deep water and by at least 75-80% in chest to neck deep water.  Viscosity of the water is needed for resistance of strengthening. Water current perturbations provides challenge to standing balance requiring increased core activation.                                                                                                                                  PATIENT EDUCATION:  Education details: intro to aquatic therapy  Person educated: Patient Education method: Programmer, Multimedia, demo  Education comprehension: verbalized understanding, return demo  HOME EXERCISE PROGRAM: TBA  ASSESSMENT:  CLINICAL IMPRESSION: Pt instruct on use of cane to improve toleration to amb particularly long distances (into and out of work at [employer]). R Hip abd visually is improved with side lunge. Added resisted glute (abductor) engagement with good toleration and no increase in right hip pain. Goal ongoing   PN: pt demonstrates reduced pain sensitivity today but is directly related to his work schedule.  His pain will fluctuate from 1/10 (not having worked a few days)to 9-10/10 (post work).  He has had no real improvement in right hip ROM  likely due to the severity of OA. His strength although has improved as indicated in chart above.  He has met 2/3 STG's and 2/6 LTG's.  He has seen surgeon and is not planning on a surgical replacement as soon as it can get scheduled.  He will continue to benefit from skilled PT aquatic intervention to further strength and encourage R hip ROM in preparation for surgery. Will likely forgo land based intervention due to pain limitations until post surgical if MD deems appropriate   OBJECTIVE IMPAIRMENTS: Abnormal gait, decreased activity tolerance, decreased balance, decreased mobility, difficulty walking,  decreased ROM, decreased strength, obesity, and pain.   ACTIVITY LIMITATIONS: lifting, bending, sitting, standing, squatting, sleeping, stairs, transfers, and locomotion level  PARTICIPATION LIMITATIONS: community activity, occupation, and yard work  PERSONAL FACTORS: Age, Fitness, and 1 comorbidity: obesity are also affecting patient's functional outcome.   REHAB POTENTIAL: Good  CLINICAL DECISION MAKING: Stable/uncomplicated  EVALUATION COMPLEXITY: Low   GOALS: Goals reviewed with patient? Yes  SHORT TERM GOALS: Target date: 01/06/24  Pt will tolerate full aquatic sessions consistently without increase in pain and with improving function to demonstrate good toleration and effectiveness of intervention.  Baseline: Goal status: Met 01/18/24  2.  Pt will consider gaining pool access for use of the properties of water for chronic conditions maintaining mobility and minimizing pain.  Baseline:  Goal status: In progress 01/18/24; 03/05/24  3.  Pt will report decrease in pain submerged to demonstrate one of the advantages of exercise in aquatics using the properties of water. Baseline:  Goal status: Met 01/18/24    LONG TERM GOALS: Target date: 04/06/23  Pt to improve on LEFS by at least 9 point to demonstrate statistically significant Improvement in function. Baseline: 25/80;  40/80 Goal status: Met 03/05/24  2.  Pt will report decrease in pain by at least 50% for improved toleration to activity/quality of life and to demonstrate improved management of pain. Baseline:  Goal status: Met 03/05/24  3.  Pt will improve R hip strength flex by at least 10 lbs to demonstrate improved overall physical function Baseline:  Goal status: In progress 03/05/24  4.  Pt will report return to consistent exercise regimen for overall wellness. Baseline:  Goal status: In progress 03/05/24  5.  Pt will improve ROM of R hip by 25% to improve function, decrease risk of injury and pain and improve quality of life Baseline:  Goal status: In progress 03/05/24  6.  Pt will be indep with final HEP's (land and aquatic as appropriate) for continued management of condition Baseline:  Goal status: In progress 03/05/24  PLAN:  PT FREQUENCY: 1-2x/week  PT DURATION: 4 weeks  PLANNED INTERVENTIONS: 97164- PT Re-evaluation, 97110-Therapeutic exercises, 97530- Therapeutic activity, 97112- Neuromuscular re-education, 97535- Self Care, 02859- Manual therapy, [phone]- Gait training, [phone]- Aquatic Therapy, [phone] (1-2 muscles), 20561 (3+ muscles)- Dry Needling, Patient/Family education, Balance training, Stair training, Taping, Joint mobilization, DME instructions, Cryotherapy, and Moist heat.  PLAN FOR NEXT SESSION: aquatic: right hip and LB ROM strengthening and pain management.  Balance and proprioception re-training  Ronal Foots) Colletta Spillers MPT 03/13/2024 12:01 PM [facility] [address] [place], [place], [address] Phone: [phone]   Fax:  [phone]    For all possible CPT codes, reference the Planned Interventions line above.     Check all conditions that are expected to impact treatment: {Conditions expected to impact treatment:Morbid obesity and Musculoskeletal disorders   If treatment provided at initial evaluation, no  treatment charged due to lack of authorization.      "

## 2024-03-14 ENCOUNTER — Other Ambulatory Visit (HOSPITAL_COMMUNITY): Payer: Self-pay

## 2024-03-20 ENCOUNTER — Encounter (HOSPITAL_BASED_OUTPATIENT_CLINIC_OR_DEPARTMENT_OTHER): Payer: Self-pay | Admitting: Physical Therapy

## 2024-03-20 ENCOUNTER — Ambulatory Visit (HOSPITAL_BASED_OUTPATIENT_CLINIC_OR_DEPARTMENT_OTHER): Payer: Self-pay | Admitting: Physical Therapy

## 2024-03-20 DIAGNOSIS — G8929 Other chronic pain: Secondary | ICD-10-CM

## 2024-03-20 DIAGNOSIS — M25551 Pain in right hip: Secondary | ICD-10-CM | POA: Diagnosis not present

## 2024-03-20 DIAGNOSIS — M6281 Muscle weakness (generalized): Secondary | ICD-10-CM

## 2024-03-20 DIAGNOSIS — R262 Difficulty in walking, not elsewhere classified: Secondary | ICD-10-CM

## 2024-03-20 NOTE — Therapy (Signed)
 " OUTPATIENT PHYSICAL THERAPY LE TREATMENT     Patient Name: Austin Beasley. MRN: [id number] DOB:Aug 15, 1980, 44 y.o., male Today's Date: 03/20/2024  END OF SESSION:  PT End of Session - 03/20/24 1126     Visit Number 10    Date for Recertification  04/05/24    Authorization Type [place] mcaid prepay    PT Start Time 1105    PT Stop Time 1145    PT Time Calculation (min) 40 min    Activity Tolerance Patient tolerated treatment well    Behavior During Therapy WFL for tasks assessed/performed             Past Medical History:  Diagnosis Date   Arthritis    right hip   Diabetes mellitus without complication (HCC)    High cholesterol    Hypertension    Obesity    Palpitations    Thyroid  disease    Past Surgical History:  Procedure Laterality Date   ELEVATION OF DEPRESSED SKULL FRACTURE     age 57   LAPAROSCOPIC GASTRIC SLEEVE RESECTION  11/16/2022   TONSILLECTOMY AND ADENOIDECTOMY     as early teenager   Patient Active Problem List   Diagnosis Date Noted   Osteoarthritis of right hip 12/03/2023   Chronic pain syndrome 12/03/2023   Chronic right hip pain 09/27/2023   Chronic bilateral low back pain with right-sided sciatica 09/27/2023   Achilles tendinitis of right lower extremity 06/01/2021   Pain due to onychomycosis of toenails of both feet 02/01/2021   Class 3 severe obesity with serious comorbidity and body mass index (BMI) of 50.0 to 59.9 in adult [facility]) 05/16/2019   Type 2 diabetes mellitus with diabetic neuropathy, without long-term current use of insulin  (HCC) 08/07/2018   Diabetes mellitus without complication (HCC) 05/23/2017    PCP: Haze Servant NP  REFERRING PROVIDER: Emeline Joesph BROCKS, DO   REFERRING DIAG:  [phone] (ICD-10-CM) - Chronic right hip pain  G89.29,M54.41 (ICD-10-CM) - Chronic bilateral low back pain with right-sided sciatica    Rationale for Evaluation and Treatment: Rehabilitation  THERAPY DIAG:  Chronic right hip  pain  Muscle weakness (generalized)  Difficulty in walking, not elsewhere classified  ONSET DATE: 1 year  SUBJECTIVE:                                                                                                                                                                                           SUBJECTIVE STATEMENT: Pain low may 1-2/10.  Has been good since I am not working much.  POOL ACCESS: limited access to [facility] (works at [employer])    From  initial evaluation:  I have lost 150 lbs in past 1.5 years.  Pain came on after that.  I was walking 3-5 miles a day to assist with weight loss but had to stop. Had a cortizone shot in my hip and helped some. I expected more. 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 Chronic R hip and low back arhtritis. Aquatherapy referral for offloading joints, strengthening and endurance training to improve ambulatory tolerance.  Stand up to 10 hours day at work pain increases. Take Tylenol .     PAIN:  Are you having pain? no: NPRS scale: current 0/10; worst 9/10; Pain location: .. R hip pain radiating down into the right foot, +numbness/tingling when he is sitting down Pain description: .intermittent, constant, sharp, and aching, stiff from R hip wrapping to his groin--constant but does wax and wane Aggravating factors: SABRASABRAThrough the day; walking, standing after prolonged, first thing in the morning sitting Relieving factors: medication  PRECAUTIONS: None  RED FLAGS: None   WEIGHT BEARING RESTRICTIONS: No  FALLS:  Has patient fallen in last 6 months? 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  DIAGNOSTIC FINDINGS:  R hip x-ray 1/25 Age advanced arthropathy of the right hip with acetabular over coverage and femoral head neck osteophytes. Similar findings are seen to a lesser extent involving the left hip.  PATIENT SURVEYS:  LEFS: 25/80 03/05/24: 40/80  COGNITION: Overall cognitive status: Within functional limits for tasks assessed     SENSATION: WFL  MUSCLE LENGTH: Hamstrings: tight bilaterally tested in sitting   POSTURE: Pelvis was  even.  Lumbar lordotic curvature was increased .  There was mild dextroscoliosis. right hip in ER  PALPATION: TTP lumbar paraspinals and R PSIS   LOWER EXTREMITY ROM:     Active  Right eval Left eval  Hip flexion 65 wfl  Hip extension Limited by ~50%   Hip abduction Limited by ~75%   Hip adduction Limited by ~75%   Hip internal rotation Limited by ~75%   Hip external rotation    Knee flexion    Knee extension    Ankle dorsiflexion    Ankle plantarflexion    Ankle inversion    Ankle eversion     (Blank rows = not tested)  03/05/24: no changes in right hip ROM  LOWER EXTREMITY MMT:    MMT Right eval Left eval R / L 03/05/24  Hip flexion 20.9 65.8 27.3  Hip extension     Hip abduction 26.2 32.8 30.5  Hip adduction     Hip internal rotation     Hip external rotation     Knee flexion     Knee extension     Ankle dorsiflexion     Ankle plantarflexion     Ankle inversion     Ankle eversion      (Blank rows = not tested)  LUMBAR SPECIAL TESTS:  Slump test: Negative  FUNCTIONAL TESTS:  Timed up and go (TUG): 11.10 5 x STS 18.36   4 stage balance :passed GAIT: Distance walked: 500 ft Assistive device utilized: None Level of assistance: Complete Independence Comments:  right hip in ER, antalgic limp off loading left  TREATMENT [facility] Adult PT Treatment:                                             Date: 03/13/24 Pt seen for aquatic therapy today.  Treatment  took place in water 3.5-4.75 ft in depth at the [facility] pool. Temp of water was 91.  Pt entered/exited the pool via stairs in step-to, hop forward with bil rail.  -walking forward/backward and side -side stepping with ue add/abd blue HB->lunge - CKC R hip int/ext rotation - standing SL clam  -seated on 3rd water step resisted seated clams using rider band 2 x6-7 - resisted hip ext and abd using rider band taught to toleration 2x 10 R/L. 2nd set of abd with slight pull into hip adduction to encourage improved range -cycling on white noodle wrapped posteriorly across chest  Pt requires the buoyancy and hydrostatic pressure of water for support, and to offload joints by unweighting joint load by at least 50 % in navel deep water and by at least 75-80% in chest to neck deep water.  Viscosity of the water is needed for resistance of strengthening. Water current perturbations provides challenge to standing balance requiring increased core activation.                                                                                                                                  PATIENT EDUCATION:  Education details: intro to aquatic therapy  Person educated: Patient Education method: Programmer, Multimedia, demo  Education comprehension: verbalized understanding, return demo  HOME EXERCISE PROGRAM: TBA  ASSESSMENT:  CLINICAL IMPRESSION: Pts overall pain sensitivity has reduced significantly in part due to his modified work schedule and in combination with the aquatic therapy intervention. Able to greatly increase loading throughout Rt hip range using rider band with good toleration. Worked into Rt hip add and int rot which he has range limitations.  Reports feeling hip muscular engagement with some tenderness in abductors.  Pt instructed to use ice or heat as needed. He VU.  Good session. Will likely plan on transitioning onto land based intervention by end of month dependant on insurance  approval and surgical date yet to be established.     PN: pt demonstrates reduced pain sensitivity today but is directly related to his work schedule.  His pain will fluctuate from 1/10 (not having worked a few days)to 9-10/10 (post work).  He has had no real improvement in right hip ROM likely due to the severity of OA. His strength although has improved as indicated in chart above.  He has met 2/3 STG's and 2/6 LTG's.  He has seen surgeon and is not planning on a surgical replacement as soon as it can get scheduled.  He will continue to  benefit from skilled PT aquatic intervention to further strength and encourage R hip ROM in preparation for surgery. Will likely forgo land based intervention due to pain limitations until post surgical if MD deems appropriate   OBJECTIVE IMPAIRMENTS: Abnormal gait, decreased activity tolerance, decreased balance, decreased mobility, difficulty walking, decreased ROM, decreased strength, obesity, and pain.   ACTIVITY LIMITATIONS: lifting, bending, sitting, standing, squatting, sleeping, stairs, transfers, and locomotion level  PARTICIPATION LIMITATIONS: community activity, occupation, and yard work  PERSONAL FACTORS: Age, Fitness, and 1 comorbidity: obesity are also affecting patient's functional outcome.   REHAB POTENTIAL: Good  CLINICAL DECISION MAKING: Stable/uncomplicated  EVALUATION COMPLEXITY: Low   GOALS: Goals reviewed with patient? Yes  SHORT TERM GOALS: Target date: 01/06/24  Pt will tolerate full aquatic sessions consistently without increase in pain and with improving function to demonstrate good toleration and effectiveness of intervention.  Baseline: Goal status: Met 01/18/24  2.  Pt will consider gaining pool access for use of the properties of water for chronic conditions maintaining mobility and minimizing pain.  Baseline:  Goal status: In progress 01/18/24; 03/05/24  3.  Pt will report decrease in pain submerged to demonstrate  one of the advantages of exercise in aquatics using the properties of water. Baseline:  Goal status: Met 01/18/24    LONG TERM GOALS: Target date: 04/06/23  Pt to improve on LEFS by at least 9 point to demonstrate statistically significant Improvement in function. Baseline: 25/80; 40/80 Goal status: Met 03/05/24  2.  Pt will report decrease in pain by at least 50% for improved toleration to activity/quality of life and to demonstrate improved management of pain. Baseline:  Goal status: Met 03/05/24  3.  Pt will improve R hip strength flex by at least 10 lbs to demonstrate improved overall physical function Baseline:  Goal status: In progress 03/05/24  4.  Pt will report return to consistent exercise regimen for overall wellness. Baseline:  Goal status: In progress 03/05/24  5.  Pt will improve ROM of R hip by 25% to improve function, decrease risk of injury and pain and improve quality of life Baseline:  Goal status: In progress 03/05/24  6.  Pt will be indep with final HEP's (land and aquatic as appropriate) for continued management of condition Baseline:  Goal status: In progress 03/05/24  PLAN:  PT FREQUENCY: 1-2x/week  PT DURATION: 4 weeks  PLANNED INTERVENTIONS: 97164- PT Re-evaluation, 97110-Therapeutic exercises, 97530- Therapeutic activity, 97112- Neuromuscular re-education, 97535- Self Care, 02859- Manual therapy, [phone]- Gait training, [phone]- Aquatic Therapy, [phone] (1-2 muscles), 20561 (3+ muscles)- Dry Needling, Patient/Family education, Balance training, Stair training, Taping, Joint mobilization, DME instructions, Cryotherapy, and Moist heat.  PLAN FOR NEXT SESSION: aquatic: right hip and LB ROM strengthening and pain management.  Balance and proprioception re-training  Ronal Foots) Ryonna Cimini MPT 03/20/2024 11:31 AM [facility] [address] [place], [place], [address] Phone: [phone]   Fax:   [phone]    For all possible CPT codes, reference the Planned Interventions line above.     Check all conditions that are expected to impact treatment: {Conditions expected to impact treatment:Morbid obesity and Musculoskeletal disorders   If treatment provided at initial evaluation, no treatment charged due to lack of authorization.      "

## 2024-03-21 ENCOUNTER — Encounter: Payer: Self-pay | Admitting: Physical Medicine & Rehabilitation

## 2024-03-27 ENCOUNTER — Encounter (HOSPITAL_BASED_OUTPATIENT_CLINIC_OR_DEPARTMENT_OTHER): Payer: Self-pay | Admitting: Physical Therapy

## 2024-03-27 ENCOUNTER — Ambulatory Visit (HOSPITAL_BASED_OUTPATIENT_CLINIC_OR_DEPARTMENT_OTHER): Payer: Self-pay | Admitting: Physical Therapy

## 2024-03-27 DIAGNOSIS — R262 Difficulty in walking, not elsewhere classified: Secondary | ICD-10-CM

## 2024-03-27 DIAGNOSIS — M25551 Pain in right hip: Secondary | ICD-10-CM | POA: Diagnosis not present

## 2024-03-27 DIAGNOSIS — G8929 Other chronic pain: Secondary | ICD-10-CM

## 2024-03-27 DIAGNOSIS — M6281 Muscle weakness (generalized): Secondary | ICD-10-CM

## 2024-03-27 NOTE — Therapy (Signed)
 " OUTPATIENT PHYSICAL THERAPY LE TREATMENT     Patient Name: Austin Beasley. MRN: [id number] DOB:24-Jun-1980, 44 y.o., male Today's Date: 03/27/2024  END OF SESSION:  PT End of Session - 03/27/24 0852     Visit Number 11    Date for Recertification  04/05/24    Authorization Type [employer] mcaid prepay    PT Start Time 0844    PT Stop Time 0925    PT Time Calculation (min) 41 min    Activity Tolerance Patient tolerated treatment well    Behavior During Therapy WFL for tasks assessed/performed             Past Medical History:  Diagnosis Date   Arthritis    right hip   Diabetes mellitus without complication (HCC)    High cholesterol    Hypertension    Obesity    Palpitations    Thyroid  disease    Past Surgical History:  Procedure Laterality Date   ELEVATION OF DEPRESSED SKULL FRACTURE     age 43   LAPAROSCOPIC GASTRIC SLEEVE RESECTION  11/16/2022   TONSILLECTOMY AND ADENOIDECTOMY     as early teenager   Patient Active Problem List   Diagnosis Date Noted   Osteoarthritis of right hip 12/03/2023   Chronic pain syndrome 12/03/2023   Chronic right hip pain 09/27/2023   Chronic bilateral low back pain with right-sided sciatica 09/27/2023   Achilles tendinitis of right lower extremity 06/01/2021   Pain due to onychomycosis of toenails of both feet 02/01/2021   Class 3 severe obesity with serious comorbidity and body mass index (BMI) of 50.0 to 59.9 in adult [facility]) 05/16/2019   Type 2 diabetes mellitus with diabetic neuropathy, without long-term current use of insulin  (HCC) 08/07/2018   Diabetes mellitus without complication (HCC) 05/23/2017    PCP: Haze Servant NP  REFERRING PROVIDER: Emeline Joesph BROCKS, DO   REFERRING DIAG:  [phone] (ICD-10-CM) - Chronic right hip pain  G89.29,M54.41 (ICD-10-CM) - Chronic bilateral low back pain with right-sided sciatica    Rationale for Evaluation and Treatment: Rehabilitation  THERAPY DIAG:  Chronic right hip  pain  Muscle weakness (generalized)  Difficulty in walking, not elsewhere classified  ONSET DATE: 1 year  SUBJECTIVE:                                                                                                                                                                                           SUBJECTIVE STATEMENT: Pt reports that he has not been working much.  Cold weather has made pain worse. Pt reports he has been resting the last 2 weeks; has not  been to [facility].  Pt reports that he is nervous about epidural for surgery- has consult with surgeon tomorrow.    POOL ACCESS: limited access to [facility] (works at [employer])    From initial evaluation:  I have lost 150 lbs in past 1.5 years.  Pain came on after that.  I was walking 3-5 miles a day to assist with weight loss but had to stop. Had a cortizone shot in my hip and helped some. I expected more. 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 Chronic R hip and low back arhtritis. Aquatherapy referral for offloading joints, strengthening and endurance training to improve ambulatory tolerance.  Stand up to 10 hours day at work pain increases. Take Tylenol .     PAIN:  Are you having pain? yes: NPRS scale: 2/10 Pain location: R hip pain radiating down into the right foot Pain description: .intermittent sharp, and aching, stiff from R hip wrapping to his groin--constant but does wax and wane Aggravating factors: SABRASABRAThrough the day; walking, standing after prolonged, first thing in the morning sitting Relieving factors: medication, rest  PRECAUTIONS: None  RED FLAGS: None   WEIGHT BEARING RESTRICTIONS: No  FALLS:  Has patient fallen in last 6 months? 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  DIAGNOSTIC FINDINGS:  R hip x-ray 1/25 Age advanced arthropathy of the right hip with acetabular over coverage and femoral head neck osteophytes. Similar findings are seen to a lesser extent involving the left hip.  PATIENT SURVEYS:  LEFS: 25/80 03/05/24: 40/80  COGNITION: Overall cognitive status: Within functional limits for tasks assessed     SENSATION: WFL  MUSCLE LENGTH: Hamstrings: tight bilaterally tested in sitting   POSTURE: Pelvis was  even.  Lumbar lordotic curvature was increased .  There was mild dextroscoliosis. right hip in ER  PALPATION: TTP lumbar paraspinals and R PSIS   LOWER EXTREMITY ROM:     Active  Right eval Left eval  Hip flexion 65 wfl  Hip extension Limited by ~50%   Hip abduction Limited by ~75%   Hip adduction Limited by ~75%   Hip internal rotation Limited by ~75%   Hip external rotation    Knee flexion    Knee extension    Ankle dorsiflexion    Ankle plantarflexion    Ankle inversion    Ankle eversion     (Blank rows = not tested)  03/05/24: no changes in right hip ROM  LOWER EXTREMITY MMT:    MMT Right eval Left eval R / L 03/05/24  Hip flexion 20.9 65.8 27.3  Hip extension     Hip abduction 26.2 32.8 30.5  Hip adduction     Hip internal rotation     Hip external rotation     Knee flexion     Knee extension     Ankle dorsiflexion     Ankle plantarflexion     Ankle inversion     Ankle eversion      (Blank rows = not tested)  LUMBAR SPECIAL TESTS:  Slump test: Negative  FUNCTIONAL TESTS:  Timed up and go (TUG): 11.10  5 x STS 18.36   4 stage balance :passed GAIT: Distance walked: 500 ft Assistive device utilized: None Level of assistance: Complete Independence Comments: right hip in ER, antalgic limp off loading left  TREATMENT [facility] Adult PT  Treatment:                                             Date: 03/27/24 Pt seen for aquatic therapy today.  Treatment took place in water 3.5-4.75 ft in depth at the [facility] pool. Temp of water was 91.  Pt entered/exited the pool via stairs in step-to, hop forward with bil rail.  -walking forward/backward and side -side stepping with UE add/abd -> with blue hand floats -> into wide squat - suitcase carry with bil blue hand floats at side, marching forward/ backward; repeated with one float at side  - UE On wall:  standing single leg clam x 10; leg swings into hip flexion/ extension - return to walking forward / backward  - squats pushing single blue hand float under water x 12 - heel/toe raises x 10 - TrA set with solid noodle pull down to thighs x 10 each leg forward, staggered stance -cycling on white noodle wrapped posteriorly across chest  Pt requires the buoyancy and hydrostatic pressure of water for support, and to offload joints by unweighting joint load by at least 50 % in navel deep water and by at least 75-80% in chest to neck deep water.  Viscosity of the water is needed for resistance of strengthening. Water current perturbations provides challenge to standing balance requiring increased core activation.                                                                                                                                  PATIENT EDUCATION:  Education details: intro to aquatic therapy  Person educated: Patient Education method: Programmer, Multimedia, demo  Education comprehension: verbalized understanding, return demo  HOME EXERCISE PROGRAM: TBA  ASSESSMENT:  CLINICAL IMPRESSION: Pt reports significant reduction in pain when exercising in water.  Pt continues to have the most difficulty with Rt hip ER and abdct.  Surgery consult tomorrow. Will assess goals next visit; end of POC 1/30.      PN: pt demonstrates reduced pain sensitivity today but is directly  related to his work schedule.  His pain will fluctuate from 1/10 (not having worked a few days)to 9-10/10 (post work).  He has had no real improvement in right hip ROM likely due to the severity of OA. His strength although has improved as indicated in chart above.  He has met 2/3 STG's and 2/6 LTG's.  He has seen surgeon and is not planning on a surgical replacement as soon as it can get scheduled.  He will continue to benefit from skilled PT aquatic intervention  to further strength and encourage R hip ROM in preparation for surgery. Will likely forgo land based intervention due to pain limitations until post surgical if MD deems appropriate   OBJECTIVE IMPAIRMENTS: Abnormal gait, decreased activity tolerance, decreased balance, decreased mobility, difficulty walking, decreased ROM, decreased strength, obesity, and pain.   ACTIVITY LIMITATIONS: lifting, bending, sitting, standing, squatting, sleeping, stairs, transfers, and locomotion level  PARTICIPATION LIMITATIONS: community activity, occupation, and yard work  PERSONAL FACTORS: Age, Fitness, and 1 comorbidity: obesity are also affecting patient's functional outcome.   REHAB POTENTIAL: Good  CLINICAL DECISION MAKING: Stable/uncomplicated  EVALUATION COMPLEXITY: Low   GOALS: Goals reviewed with patient? Yes  SHORT TERM GOALS: Target date: 01/06/24  Pt will tolerate full aquatic sessions consistently without increase in pain and with improving function to demonstrate good toleration and effectiveness of intervention.  Baseline: Goal status: Met 01/18/24  2.  Pt will consider gaining pool access for use of the properties of water for chronic conditions maintaining mobility and minimizing pain.  Baseline:  Goal status: In progress 03/05/24  3.  Pt will report decrease in pain submerged to demonstrate one of the advantages of exercise in aquatics using the properties of water. Baseline:  Goal status: Met 01/18/24    LONG TERM  GOALS: Target date: 04/06/23  Pt to improve on LEFS by at least 9 point to demonstrate statistically significant Improvement in function. Baseline: 25/80; 40/80 Goal status: Met 03/05/24  2.  Pt will report decrease in pain by at least 50% for improved toleration to activity/quality of life and to demonstrate improved management of pain. Baseline:  Goal status: Met 03/05/24  3.  Pt will improve R hip strength flex by at least 10 lbs to demonstrate improved overall physical function Baseline:  Goal status: In progress 03/05/24  4.  Pt will report return to consistent exercise regimen for overall wellness. Baseline:  Goal status: In progress 03/05/24  5.  Pt will improve ROM of R hip by 25% to improve function, decrease risk of injury and pain and improve quality of life Baseline:  Goal status: In progress 03/05/24  6.  Pt will be indep with final HEP's (land and aquatic as appropriate) for continued management of condition Baseline:  Goal status: In progress 03/05/24  PLAN:  PT FREQUENCY: 1-2x/week  PT DURATION: 4 weeks  PLANNED INTERVENTIONS: 97164- PT Re-evaluation, 97110-Therapeutic exercises, 97530- Therapeutic activity, 97112- Neuromuscular re-education, 97535- Self Care, 02859- Manual therapy, [phone]- Gait training, [phone]- Aquatic Therapy, [phone] (1-2 muscles), [id number] (3+ muscles)- Dry Needling, Patient/Family education, Balance training, Stair training, Taping, Joint mobilization, DME instructions, Cryotherapy, and Moist heat.  PLAN FOR NEXT SESSION: aquatic: right hip and LB ROM strengthening and pain management.  Balance and proprioception re-training  Delon Aquas, PTA 03/27/24 9:24 AM [facility] [address] [place], [place], [address] Phone: [phone]   Fax:  [phone]   For all possible CPT codes, reference the Planned Interventions line above.     Check all conditions that are expected to impact  treatment: {Conditions expected to impact treatment:Morbid obesity and Musculoskeletal disorders   If treatment provided at initial evaluation, no treatment charged due to lack of authorization.      "

## 2024-03-28 ENCOUNTER — Telehealth (HOSPITAL_BASED_OUTPATIENT_CLINIC_OR_DEPARTMENT_OTHER): Payer: Self-pay | Admitting: Physical Therapy

## 2024-03-28 ENCOUNTER — Ambulatory Visit: Admitting: Orthopaedic Surgery

## 2024-03-28 VITALS — Ht 72.0 in | Wt 311.0 lb

## 2024-03-28 DIAGNOSIS — M1611 Unilateral primary osteoarthritis, right hip: Secondary | ICD-10-CM | POA: Diagnosis not present

## 2024-03-28 NOTE — Telephone Encounter (Signed)
 LVM offering openings 1/22 and 1/23, advised of active offers in Mychart. Requested call back.  Hermilo Dutter C. Theodore Virgin PT, DPT 03/28/24 8:06 AM

## 2024-03-28 NOTE — Progress Notes (Signed)
 The patient is a 44 year old gentleman well-known to us .  He has severe end-stage arthritis of his right hip and is in need of hip replacement surgery.  He has been on a weight loss journey and he is on a GLP-1.  He has lost over 100 pounds.  His BMI is still 42 but it is much less.  He is a diabetic but his hemoglobin A1c is down to 5.3.  He is having daily severe right hip pain that is detriment affecting his mobility, his quality of life and his actives daily living.  I have reviewed his x-rays from his last visit and it does show severe end-stage bone-on-bone arthritis of the right hip.  He has a hard time with getting his shoes and socks on and he does walk with a significant limp and Trendelenburg gait.  His wife was with him today as well.  On exam he has significant stiffness with internal/external rotation of the right hip and I cannot fully rotate the right hip.  The left hip moves more smoothly.  We did have him lie down in a supine position so we could see if we could successfully get to the hip to an anterior approach.  We feel comfortable with proceeding with surgery.  At his last visit we did give him a hand about a hip relation surgery.  We discussed again what the surgery involves and talked about what to expect from an intraoperative and postoperative standpoint.  He would need to stop his GLP-1 medication 1 week prior to surgery.  He would likely need to be out of work several weeks after surgery in terms of being on his feet all day long until he feels better but I think he will make good progress.  All question concerns were answered and addressed.  Will work on getting him scheduled.

## 2024-03-29 ENCOUNTER — Other Ambulatory Visit: Payer: Self-pay

## 2024-03-29 ENCOUNTER — Other Ambulatory Visit (HOSPITAL_COMMUNITY): Payer: Self-pay

## 2024-03-30 ENCOUNTER — Other Ambulatory Visit (HOSPITAL_COMMUNITY): Payer: Self-pay

## 2024-04-05 ENCOUNTER — Other Ambulatory Visit: Payer: Self-pay

## 2024-04-10 ENCOUNTER — Ambulatory Visit (HOSPITAL_BASED_OUTPATIENT_CLINIC_OR_DEPARTMENT_OTHER): Admitting: Physical Therapy

## 2024-04-12 ENCOUNTER — Telehealth: Payer: Self-pay

## 2024-04-12 NOTE — Telephone Encounter (Signed)
 I called patient to discuss scheduling right THA.  Left voice mail message for return call.

## 2024-04-17 ENCOUNTER — Ambulatory Visit (HOSPITAL_BASED_OUTPATIENT_CLINIC_OR_DEPARTMENT_OTHER): Admitting: Physical Therapy

## 2024-04-17 ENCOUNTER — Encounter: Admitting: Physical Medicine and Rehabilitation

## 2024-04-18 ENCOUNTER — Ambulatory Visit: Admitting: Podiatry

## 2024-04-24 ENCOUNTER — Ambulatory Visit (HOSPITAL_BASED_OUTPATIENT_CLINIC_OR_DEPARTMENT_OTHER): Admitting: Physical Therapy

## 2024-05-01 ENCOUNTER — Ambulatory Visit (HOSPITAL_BASED_OUTPATIENT_CLINIC_OR_DEPARTMENT_OTHER): Admitting: Physical Therapy

## 2024-06-11 ENCOUNTER — Ambulatory Visit: Admitting: Nurse Practitioner
